# Patient Record
Sex: Female | Born: 1950
Health system: Southern US, Community
[De-identification: ages and names within clinical notes are randomized; demographics above are authoritative.]

## PROBLEM LIST (undated history)

## (undated) DIAGNOSIS — Z951 Presence of aortocoronary bypass graft: Secondary | ICD-10-CM

## (undated) DIAGNOSIS — K219 Gastro-esophageal reflux disease without esophagitis: Secondary | ICD-10-CM

## (undated) DIAGNOSIS — M1990 Unspecified osteoarthritis, unspecified site: Secondary | ICD-10-CM

## (undated) DIAGNOSIS — E669 Obesity, unspecified: Secondary | ICD-10-CM

## (undated) DIAGNOSIS — C50919 Malignant neoplasm of unspecified site of unspecified female breast: Secondary | ICD-10-CM

## (undated) DIAGNOSIS — I519 Heart disease, unspecified: Secondary | ICD-10-CM

## (undated) DIAGNOSIS — E785 Hyperlipidemia, unspecified: Secondary | ICD-10-CM

## (undated) DIAGNOSIS — E079 Disorder of thyroid, unspecified: Secondary | ICD-10-CM

## (undated) DIAGNOSIS — G4730 Sleep apnea, unspecified: Secondary | ICD-10-CM

## (undated) DIAGNOSIS — I10 Essential (primary) hypertension: Secondary | ICD-10-CM

## (undated) DIAGNOSIS — H409 Unspecified glaucoma: Secondary | ICD-10-CM

## (undated) HISTORY — PX: ABDOMINAL HYSTERECTOMY: SHX81

## (undated) HISTORY — DX: Disorder of thyroid, unspecified: E07.9

## (undated) HISTORY — PX: TUBAL LIGATION: SHX77

## (undated) HISTORY — DX: Unspecified glaucoma: H40.9

## (undated) HISTORY — DX: Malignant neoplasm of unspecified site of unspecified female breast: C50.919

## (undated) HISTORY — PX: CHOLECYSTECTOMY: SHX55

## (undated) HISTORY — DX: Sleep apnea, unspecified: G47.30

## (undated) HISTORY — DX: Unspecified osteoarthritis, unspecified site: M19.90

## (undated) HISTORY — DX: Heart disease, unspecified: I51.9

## (undated) HISTORY — PX: TONSILLECTOMY: SUR1361

---

## 1995-08-17 HISTORY — PX: BREAST LUMPECTOMY: SHX2

## 1998-05-01 ENCOUNTER — Other Ambulatory Visit: Admission: RE | Admit: 1998-05-01 | Discharge: 1998-05-01 | Payer: Self-pay | Admitting: Internal Medicine

## 1998-06-02 ENCOUNTER — Ambulatory Visit (HOSPITAL_COMMUNITY): Admission: RE | Admit: 1998-06-02 | Discharge: 1998-06-02 | Payer: Self-pay | Admitting: Internal Medicine

## 1998-12-19 ENCOUNTER — Encounter: Payer: Self-pay | Admitting: Internal Medicine

## 1998-12-19 ENCOUNTER — Ambulatory Visit (HOSPITAL_COMMUNITY): Admission: RE | Admit: 1998-12-19 | Discharge: 1998-12-19 | Payer: Self-pay | Admitting: Internal Medicine

## 1999-01-05 ENCOUNTER — Ambulatory Visit (HOSPITAL_COMMUNITY): Admission: RE | Admit: 1999-01-05 | Discharge: 1999-01-05 | Payer: Self-pay | Admitting: Internal Medicine

## 1999-01-05 ENCOUNTER — Encounter: Payer: Self-pay | Admitting: Internal Medicine

## 2002-02-21 ENCOUNTER — Other Ambulatory Visit: Admission: RE | Admit: 2002-02-21 | Discharge: 2002-02-21 | Payer: Self-pay | Admitting: Obstetrics and Gynecology

## 2002-02-26 ENCOUNTER — Encounter: Payer: Self-pay | Admitting: Obstetrics and Gynecology

## 2002-02-26 ENCOUNTER — Encounter: Admission: RE | Admit: 2002-02-26 | Discharge: 2002-02-26 | Payer: Self-pay | Admitting: Obstetrics and Gynecology

## 2003-03-25 ENCOUNTER — Encounter: Payer: Self-pay | Admitting: Obstetrics and Gynecology

## 2003-03-25 ENCOUNTER — Encounter: Admission: RE | Admit: 2003-03-25 | Discharge: 2003-03-25 | Payer: Self-pay | Admitting: Obstetrics and Gynecology

## 2003-04-10 ENCOUNTER — Other Ambulatory Visit: Admission: RE | Admit: 2003-04-10 | Discharge: 2003-04-10 | Payer: Self-pay | Admitting: Obstetrics and Gynecology

## 2004-03-25 ENCOUNTER — Encounter: Admission: RE | Admit: 2004-03-25 | Discharge: 2004-03-25 | Payer: Self-pay | Admitting: Obstetrics and Gynecology

## 2004-04-21 ENCOUNTER — Other Ambulatory Visit: Admission: RE | Admit: 2004-04-21 | Discharge: 2004-04-21 | Payer: Self-pay | Admitting: Obstetrics and Gynecology

## 2004-05-07 ENCOUNTER — Ambulatory Visit: Payer: Self-pay | Admitting: Cardiology

## 2004-06-18 ENCOUNTER — Ambulatory Visit: Payer: Self-pay | Admitting: Internal Medicine

## 2004-07-30 ENCOUNTER — Ambulatory Visit: Payer: Self-pay | Admitting: *Deleted

## 2004-09-24 ENCOUNTER — Ambulatory Visit: Payer: Self-pay | Admitting: *Deleted

## 2005-04-26 ENCOUNTER — Encounter: Admission: RE | Admit: 2005-04-26 | Discharge: 2005-04-26 | Payer: Self-pay | Admitting: Obstetrics and Gynecology

## 2005-06-23 ENCOUNTER — Encounter: Admission: RE | Admit: 2005-06-23 | Discharge: 2005-06-23 | Payer: Self-pay | Admitting: Surgery

## 2005-07-02 ENCOUNTER — Encounter: Admission: RE | Admit: 2005-07-02 | Discharge: 2005-07-02 | Payer: Self-pay | Admitting: Surgery

## 2006-01-07 ENCOUNTER — Encounter: Admission: RE | Admit: 2006-01-07 | Discharge: 2006-01-07 | Payer: Self-pay | Admitting: Obstetrics and Gynecology

## 2006-06-08 ENCOUNTER — Encounter: Admission: RE | Admit: 2006-06-08 | Discharge: 2006-06-08 | Payer: Self-pay | Admitting: Obstetrics and Gynecology

## 2007-06-12 ENCOUNTER — Encounter: Admission: RE | Admit: 2007-06-12 | Discharge: 2007-06-12 | Payer: Self-pay | Admitting: Obstetrics and Gynecology

## 2007-12-14 ENCOUNTER — Ambulatory Visit: Payer: Self-pay | Admitting: Internal Medicine

## 2007-12-14 DIAGNOSIS — J45909 Unspecified asthma, uncomplicated: Secondary | ICD-10-CM | POA: Insufficient documentation

## 2007-12-14 DIAGNOSIS — C50919 Malignant neoplasm of unspecified site of unspecified female breast: Secondary | ICD-10-CM | POA: Insufficient documentation

## 2007-12-15 ENCOUNTER — Telehealth (INDEPENDENT_AMBULATORY_CARE_PROVIDER_SITE_OTHER): Payer: Self-pay | Admitting: *Deleted

## 2008-01-30 ENCOUNTER — Ambulatory Visit: Payer: Self-pay | Admitting: Internal Medicine

## 2008-01-30 DIAGNOSIS — J069 Acute upper respiratory infection, unspecified: Secondary | ICD-10-CM | POA: Insufficient documentation

## 2008-01-31 ENCOUNTER — Telehealth (INDEPENDENT_AMBULATORY_CARE_PROVIDER_SITE_OTHER): Payer: Self-pay | Admitting: *Deleted

## 2008-02-05 ENCOUNTER — Telehealth (INDEPENDENT_AMBULATORY_CARE_PROVIDER_SITE_OTHER): Payer: Self-pay | Admitting: *Deleted

## 2008-02-06 ENCOUNTER — Telehealth (INDEPENDENT_AMBULATORY_CARE_PROVIDER_SITE_OTHER): Payer: Self-pay | Admitting: *Deleted

## 2008-06-12 ENCOUNTER — Encounter: Admission: RE | Admit: 2008-06-12 | Discharge: 2008-06-12 | Payer: Self-pay | Admitting: Surgery

## 2009-06-13 ENCOUNTER — Encounter: Admission: RE | Admit: 2009-06-13 | Discharge: 2009-06-13 | Payer: Self-pay | Admitting: Obstetrics & Gynecology

## 2010-06-15 ENCOUNTER — Encounter: Admission: RE | Admit: 2010-06-15 | Discharge: 2010-06-15 | Payer: Self-pay | Admitting: Obstetrics & Gynecology

## 2011-01-01 ENCOUNTER — Encounter (INDEPENDENT_AMBULATORY_CARE_PROVIDER_SITE_OTHER): Payer: Self-pay | Admitting: Surgery

## 2011-05-10 ENCOUNTER — Other Ambulatory Visit (INDEPENDENT_AMBULATORY_CARE_PROVIDER_SITE_OTHER): Payer: Self-pay | Admitting: Surgery

## 2011-05-10 DIAGNOSIS — Z1231 Encounter for screening mammogram for malignant neoplasm of breast: Secondary | ICD-10-CM

## 2011-06-17 ENCOUNTER — Ambulatory Visit: Payer: Self-pay

## 2011-06-18 ENCOUNTER — Ambulatory Visit: Payer: Self-pay

## 2011-06-22 ENCOUNTER — Ambulatory Visit
Admission: RE | Admit: 2011-06-22 | Discharge: 2011-06-22 | Disposition: A | Discharge status: Home / Self Care | Payer: PRIVATE HEALTH INSURANCE | Source: Ambulatory Visit | Attending: Surgery | Admitting: Surgery

## 2011-06-22 DIAGNOSIS — Z1231 Encounter for screening mammogram for malignant neoplasm of breast: Secondary | ICD-10-CM

## 2011-06-30 ENCOUNTER — Encounter (INDEPENDENT_AMBULATORY_CARE_PROVIDER_SITE_OTHER): Payer: Self-pay

## 2011-07-01 ENCOUNTER — Ambulatory Visit (INDEPENDENT_AMBULATORY_CARE_PROVIDER_SITE_OTHER): Payer: PRIVATE HEALTH INSURANCE | Admitting: Surgery

## 2011-07-01 ENCOUNTER — Encounter (INDEPENDENT_AMBULATORY_CARE_PROVIDER_SITE_OTHER): Payer: Self-pay | Admitting: Surgery

## 2011-07-01 VITALS — BP 120/82 | HR 72 | Temp 97.0°F | Resp 18 | Ht 62.0 in | Wt 194.0 lb

## 2011-07-01 DIAGNOSIS — C50919 Malignant neoplasm of unspecified site of unspecified female breast: Secondary | ICD-10-CM

## 2011-07-01 NOTE — Progress Notes (Signed)
[facility]  Ovidio Kin, MD,  FACS [address] [place]  [address] [place], [place]    [address] Phone:  [phone] FAX:  [phone]  ASSESSMENT AND PLAN: 1.  Right breast cancer  10 o'clock (UOQ)  Treatment in [place]  Doing well,  Disease free  See me back in one year  2.  OSA - on CPAP - doing well with this  3.  Glaucoma - Dr. Dione Booze - for surgery next week. 4.  Colonoscopy by Dr. Matthias Hughs - Feb 2011.  HISTORY OF PRESENT ILLNESS: Chief Complaint  Patient presents with  . Breast Cancer Long Term Follow Up   Carolyn Fleming is a 60 y.o. (DOB: [birth year])  AA female who is a patient of PLACEY,MARY H, NP, NP and comes to me today for follow up of right breast cancer.  Doing well from breast standpoint.  Her new problem is she has glaucoma of right eye.  Dr. Dione Booze to operate in about one week.  She's anxious about this.   PHYSICAL EXAM: BP 120/82  Pulse 72  Temp(Src) 97 F (36.1 C) (Temporal)  Resp 18  Ht 5\' 2"  (1.575 m)  Wt 194 lb (87.998 kg)  BMI 35.48 kg/m2  HEENT:  Pupils equal.  Dentition good.  No injury. NECK:  Supple.  No thyroid mass. LYMPH NODES:  No cervical, supraclavicular, or axillary adenopathy. BREASTS -  RIGHT:  No palpable mass or nodule.  No nipple discharge.  Scar at 10 o'clock okay.   LEFT:  No palpable mass or nodule.  No nipple discharge. UPPER EXTREMITIES:  No evidence of lymphedema.  DATA REVIEWED: Mammogram - Nov 2012 - okay

## 2011-12-01 ENCOUNTER — Other Ambulatory Visit: Payer: Self-pay | Admitting: *Deleted

## 2011-12-01 DIAGNOSIS — R0789 Other chest pain: Secondary | ICD-10-CM

## 2011-12-02 ENCOUNTER — Ambulatory Visit
Admission: RE | Admit: 2011-12-02 | Discharge: 2011-12-02 | Disposition: A | Discharge status: Home / Self Care | Payer: PRIVATE HEALTH INSURANCE | Source: Ambulatory Visit | Attending: *Deleted | Admitting: *Deleted

## 2011-12-02 DIAGNOSIS — R0789 Other chest pain: Secondary | ICD-10-CM

## 2011-12-06 ENCOUNTER — Other Ambulatory Visit: Payer: PRIVATE HEALTH INSURANCE

## 2012-01-23 ENCOUNTER — Other Ambulatory Visit: Payer: Self-pay

## 2012-01-23 ENCOUNTER — Emergency Department (HOSPITAL_COMMUNITY): Payer: PRIVATE HEALTH INSURANCE

## 2012-01-23 ENCOUNTER — Encounter (HOSPITAL_COMMUNITY): Payer: Self-pay | Admitting: *Deleted

## 2012-01-23 ENCOUNTER — Inpatient Hospital Stay (HOSPITAL_COMMUNITY)
Admission: EM | Admit: 2012-01-23 | Discharge: 2012-01-29 | DRG: 236 | Disposition: A | Discharge status: Home / Self Care | Payer: PRIVATE HEALTH INSURANCE | Source: Ambulatory Visit | Attending: Thoracic Surgery (Cardiothoracic Vascular Surgery) | Admitting: Thoracic Surgery (Cardiothoracic Vascular Surgery)

## 2012-01-23 DIAGNOSIS — Z885 Allergy status to narcotic agent status: Secondary | ICD-10-CM

## 2012-01-23 DIAGNOSIS — G4730 Sleep apnea, unspecified: Secondary | ICD-10-CM | POA: Diagnosis present

## 2012-01-23 DIAGNOSIS — Z853 Personal history of malignant neoplasm of breast: Secondary | ICD-10-CM

## 2012-01-23 DIAGNOSIS — M129 Arthropathy, unspecified: Secondary | ICD-10-CM | POA: Diagnosis present

## 2012-01-23 DIAGNOSIS — Z951 Presence of aortocoronary bypass graft: Secondary | ICD-10-CM

## 2012-01-23 DIAGNOSIS — Z90710 Acquired absence of both cervix and uterus: Secondary | ICD-10-CM

## 2012-01-23 DIAGNOSIS — J45909 Unspecified asthma, uncomplicated: Secondary | ICD-10-CM | POA: Diagnosis present

## 2012-01-23 DIAGNOSIS — Z6835 Body mass index (BMI) 35.0-35.9, adult: Secondary | ICD-10-CM

## 2012-01-23 DIAGNOSIS — I2584 Coronary atherosclerosis due to calcified coronary lesion: Secondary | ICD-10-CM | POA: Diagnosis present

## 2012-01-23 DIAGNOSIS — I2510 Atherosclerotic heart disease of native coronary artery without angina pectoris: Secondary | ICD-10-CM | POA: Diagnosis present

## 2012-01-23 DIAGNOSIS — I214 Non-ST elevation (NSTEMI) myocardial infarction: Principal | ICD-10-CM | POA: Diagnosis present

## 2012-01-23 DIAGNOSIS — E8779 Other fluid overload: Secondary | ICD-10-CM | POA: Diagnosis not present

## 2012-01-23 DIAGNOSIS — E785 Hyperlipidemia, unspecified: Secondary | ICD-10-CM | POA: Diagnosis present

## 2012-01-23 DIAGNOSIS — K219 Gastro-esophageal reflux disease without esophagitis: Secondary | ICD-10-CM | POA: Diagnosis present

## 2012-01-23 DIAGNOSIS — I10 Essential (primary) hypertension: Secondary | ICD-10-CM | POA: Diagnosis present

## 2012-01-23 DIAGNOSIS — Z7982 Long term (current) use of aspirin: Secondary | ICD-10-CM

## 2012-01-23 DIAGNOSIS — E669 Obesity, unspecified: Secondary | ICD-10-CM | POA: Diagnosis present

## 2012-01-23 DIAGNOSIS — Z882 Allergy status to sulfonamides status: Secondary | ICD-10-CM

## 2012-01-23 DIAGNOSIS — Z9089 Acquired absence of other organs: Secondary | ICD-10-CM

## 2012-01-23 DIAGNOSIS — R079 Chest pain, unspecified: Secondary | ICD-10-CM

## 2012-01-23 DIAGNOSIS — Z91040 Latex allergy status: Secondary | ICD-10-CM

## 2012-01-23 HISTORY — DX: Essential (primary) hypertension: I10

## 2012-01-23 HISTORY — DX: Gastro-esophageal reflux disease without esophagitis: K21.9

## 2012-01-23 HISTORY — DX: Obesity, unspecified: E66.9

## 2012-01-23 HISTORY — DX: Presence of aortocoronary bypass graft: Z95.1

## 2012-01-23 HISTORY — DX: Hyperlipidemia, unspecified: E78.5

## 2012-01-23 LAB — CARDIAC PANEL(CRET KIN+CKTOT+MB+TROPI)
Relative Index: 13.5 — ABNORMAL HIGH (ref 0.0–2.5)
Total CK: 1123 U/L — ABNORMAL HIGH (ref 7–177)
Troponin I: >25 ng/mL (ref <0.30)

## 2012-01-23 LAB — DIFFERENTIAL
Basophils Absolute: 0 10*3/uL (ref 0.0–0.1)
Basophils Relative: 0 % (ref 0–1)
Eosinophils Relative: 0 % (ref 0–5)
Monocytes Absolute: 0.7 10*3/uL (ref 0.1–1.0)
Monocytes Relative: 5 % (ref 3–12)

## 2012-01-23 LAB — CBC
HCT: 44.5 % (ref 36.0–46.0)
MCH: 28.8 pg (ref 26.0–34.0)
MCHC: 34.6 g/dL (ref 30.0–36.0)
MCV: 83.3 fL (ref 78.0–100.0)
RDW: 13.7 % (ref 11.5–15.5)

## 2012-01-23 LAB — BASIC METABOLIC PANEL
BUN: 17 mg/dL (ref 6–23)
Calcium: 10.5 mg/dL (ref 8.4–10.5)
Creatinine, Ser: 1 mg/dL (ref 0.50–1.10)
GFR calc Af Amer: 70 mL/min — ABNORMAL LOW (ref >90)

## 2012-01-23 LAB — PROTIME-INR: Prothrombin Time: 13 seconds (ref 11.6–15.2)

## 2012-01-23 LAB — POCT I-STAT TROPONIN I: Troponin i, poc: 3.28 ng/mL (ref 0.00–0.08)

## 2012-01-23 LAB — MAGNESIUM: Magnesium: 1.7 mg/dL (ref 1.5–2.5)

## 2012-01-23 MED ORDER — HEPARIN (PORCINE) IN NACL 100-0.45 UNIT/ML-% IJ SOLN
1000.0000 [IU]/h | INTRAMUSCULAR | Status: DC
  Administered 2012-01-23: 1000 [IU]/h via INTRAVENOUS
  Filled 2012-01-23 (×2): qty 250

## 2012-01-23 MED ORDER — METOPROLOL TARTRATE 1 MG/ML IV SOLN: 5.0000 mg | Freq: Once | INTRAVENOUS | Status: DC

## 2012-01-23 MED ORDER — ASPIRIN EC 81 MG PO TBEC
81.0000 mg | DELAYED_RELEASE_TABLET | Freq: Every day | ORAL | Status: DC
  Administered 2012-01-24: 81 mg via ORAL
  Filled 2012-01-23: qty 1

## 2012-01-23 MED ORDER — ASPIRIN 81 MG PO CHEW: 324.0000 mg | CHEWABLE_TABLET | ORAL | Status: DC

## 2012-01-23 MED ORDER — DIPHENHYDRAMINE HCL 25 MG PO CAPS
25.0000 mg | ORAL_CAPSULE | Freq: Once | ORAL | Status: AC
  Administered 2012-01-24: 25 mg via ORAL
  Filled 2012-01-23: qty 1

## 2012-01-23 MED ORDER — NITROGLYCERIN 0.4 MG SL SUBL: 0.4000 mg | SUBLINGUAL_TABLET | SUBLINGUAL | Status: DC | PRN

## 2012-01-23 MED ORDER — LABETALOL HCL 5 MG/ML IV SOLN
20.0000 mg | Freq: Once | INTRAVENOUS | Status: AC
  Administered 2012-01-23: 20 mg via INTRAVENOUS
  Filled 2012-01-23: qty 4

## 2012-01-23 MED ORDER — ASPIRIN 300 MG RE SUPP
300.0000 mg | RECTAL | Status: DC
  Filled 2012-01-23: qty 1

## 2012-01-23 MED ORDER — NITROGLYCERIN IN D5W 200-5 MCG/ML-% IV SOLN
2.0000 ug/min | INTRAVENOUS | Status: DC
  Administered 2012-01-23: 10 ug/min via INTRAVENOUS
  Administered 2012-01-24: 15 ug/min via INTRAVENOUS
  Administered 2012-01-24: 20 ug/min via INTRAVENOUS
  Filled 2012-01-23 (×2): qty 250

## 2012-01-23 MED ORDER — ATORVASTATIN CALCIUM 20 MG PO TABS
20.0000 mg | ORAL_TABLET | Freq: Every day | ORAL | Status: DC
  Administered 2012-01-25 – 2012-01-28 (×4): 20 mg via ORAL
  Filled 2012-01-23 (×6): qty 1

## 2012-01-23 MED ORDER — METOPROLOL TARTRATE 25 MG PO TABS
25.0000 mg | ORAL_TABLET | Freq: Two times a day (BID) | ORAL | Status: DC
  Administered 2012-01-24 (×2): 25 mg via ORAL
  Filled 2012-01-23 (×3): qty 1

## 2012-01-23 MED ORDER — ACETAMINOPHEN 325 MG PO TABS
650.0000 mg | ORAL_TABLET | ORAL | Status: DC | PRN
  Administered 2012-01-24: 650 mg via ORAL
  Filled 2012-01-23: qty 2

## 2012-01-23 MED ORDER — HEPARIN BOLUS VIA INFUSION
4000.0000 [IU] | Freq: Once | INTRAVENOUS | Status: AC
  Administered 2012-01-23: 4000 [IU] via INTRAVENOUS

## 2012-01-23 MED ORDER — ONDANSETRON HCL 4 MG/2ML IJ SOLN: 4.0000 mg | Freq: Four times a day (QID) | INTRAMUSCULAR | Status: DC | PRN

## 2012-01-23 MED ORDER — ASPIRIN 81 MG PO CHEW
324.0000 mg | CHEWABLE_TABLET | Freq: Once | ORAL | Status: AC
  Administered 2012-01-23: 324 mg via ORAL
  Filled 2012-01-23: qty 4

## 2012-01-23 NOTE — ED Notes (Signed)
Pt from home with reports of intermittent central chest pain that comes and goes for about a month. Pt reports vomiting 3 times prior to arrival but denied nausea. Pt endorses hx of same for about a month and has seen PCP with normal EKG done in May. Pt reports being treated for acid reflux with no improvement in symptoms. Pt reports almost passing out during episode and endorses that pain is worse with lying down.

## 2012-01-23 NOTE — Progress Notes (Signed)
Received patient from [facility] via [facility] . Patient awake, alert  and oriented x 4 . Patient denies pain or discomfort. Heparin gtt infusing at 1000 unit/hr, NS @ 125 mls /hr, 02 @2  LNP. Patient oriented to room, night table, telephone in close reach. Pt settled comfortable in bed. Pt's only concern is to have something for sleep. We will continue to monitor.

## 2012-01-23 NOTE — Progress Notes (Signed)
Lab results CK,MB 151.9, Troponin > 25 Dr Maryelizabeth Kaufmann on unit and was made aware. Order received to start Ntg gtt stat.

## 2012-01-23 NOTE — Progress Notes (Signed)
Pt. Refused cpap. Pt.states she will nofify if she feels like she needs cpap. Pt. States she uses nasal pillows at home and does not like the mask type that are offered here at the hospital. Pt. Currently stable on 2L of oxygen sats at 97.

## 2012-01-23 NOTE — ED Notes (Signed)
Gave troponin results to MD Brooke Dare

## 2012-01-23 NOTE — Progress Notes (Signed)
ANTICOAGULATION CONSULT NOTE - Initial Consult  Pharmacy Consult for heparin Indication: NSTEMI  Allergies  Allergen Reactions  . Aspirin     REACTION: gi upset  . Codeine   . Latex     REACTION: hives  . Sulfonamide Derivatives     REACTION: gi upset    Patient Measurements: Height: 5\' 2"  (157.5 cm) Weight: 191 lb 5.8 oz (86.8 kg) IBW/kg (Calculated) : 50.1   Vital Signs: Temp: 98.6 F (37 C) (06/09 1625) Temp src: Oral (06/09 1625) BP: 172/78 mmHg (06/09 2118) Pulse Rate: 81  (06/09 2118)  Labs:  Basename 01/23/12 1850  HGB 15.4*  HCT 44.5  PLT 292  APTT --  LABPROT 13.0  INR 0.96  HEPARINUNFRC --  CREATININE 1.00  CKTOTAL --  CKMB --  TROPONINI --    Estimated Creatinine Clearance: 61.2 ml/min (by C-G formula based on Cr of 1).   Medical History: Past Medical History  Diagnosis Date  . Asthma   . Arthritis   . Sleep apnea     uses cpap  . Breast cancer   . GERD (gastroesophageal reflux disease)     Medications:  Prescriptions prior to admission  Medication Sig Dispense Refill  . Albuterol Sulfate (PROAIR HFA IN) Inhale into the lungs. Emergency inhaler for Asthma      . Betaxolol HCl (BETOPTIC-S OP) Apply 1 drop to eye 2 (two) times daily.       . Bimatoprost (LUMIGAN OP) Apply 1 drop to eye every evening.        . Brimonidine Tartrate-Timolol (COMBIGAN OP) Apply 1 drop to eye 2 (two) times daily. 1 drop both eyes am & pm      . dorzolamide (TRUSOPT) 2 % ophthalmic solution Place 1 drop into both eyes 3 (three) times daily.      Marland Kitchen levalbuterol (XOPENEX) 0.63 MG/3ML nebulizer solution Take 1 ampule by nebulization every 6 (six) hours as needed. Wheezing.      Marland Kitchen loratadine (CLARITIN) 10 MG tablet Take 10 mg by mouth daily.      . mometasone (NASONEX) 50 MCG/ACT nasal spray Place 2 sprays into the nose 2 (two) times daily.      Marland Kitchen omeprazole (PRILOSEC) 20 MG capsule Take 20 mg by mouth daily.      . sodium chloride (OCEAN) 0.65 % nasal spray Place  1 spray into the nose as needed. Congestion.       Scheduled:    . aspirin  324 mg Oral Once  . aspirin  324 mg Oral NOW   Or  . aspirin  300 mg Rectal NOW  . aspirin EC  81 mg Oral Daily  . atorvastatin  20 mg Oral q1800  . heparin  4,000 Units Intravenous Once  . labetalol  20 mg Intravenous Once  . metoprolol tartrate  25 mg Oral BID  . DISCONTD: metoprolol  5 mg Intravenous Once    Assessment: 61yo female transferred from [facility] for NSTEMI to continue heparin until cardiac cath.  Goal of Therapy:  Heparin level 0.3-0.7 units/ml Monitor platelets by anticoagulation protocol: Yes   Plan:  Heparin bolus of 4000 units followed by gtt at 1000 units/hr was started at [facility]; will continue at current rate and monitor heparin levels and CBC.  Colleen Can PharmD BCPS 01/23/2012,10:07 PM

## 2012-01-23 NOTE — ED Notes (Signed)
Pt states that she is a hard stick -  Would like to see if the IV team can get the lab samples when they come to place IV.

## 2012-01-23 NOTE — ED Provider Notes (Signed)
History     CSN: [id number]  Arrival date & time 01/23/12  1509   First MD Initiated Contact with Patient 01/23/12 1704      Chief Complaint  Patient presents with  . Chest Pain  . Emesis    (Consider location/radiation/quality/duration/timing/severity/associated sxs/prior treatment) HPI Comments: She has been treated with PPI and seen a gastroenterologist for this presentation however she is noticed it has been worse with exertion. Near syncopal  Patient is a 61 y.o. female presenting with chest pain. The history is provided by the patient. No language interpreter was used.  Chest Pain Episode onset: intermittent for the past few weeks. Chest pain occurs constantly. The chest pain is worsening. The pain is associated with exertion. The severity of the pain is severe. The quality of the pain is described as sharp. The pain radiates to the mid back and upper back. Chest pain is worsened by exertion. Primary symptoms include shortness of breath. Pertinent negatives for primary symptoms include no fever, no fatigue, no syncope, no cough, no wheezing, no palpitations, no abdominal pain, no nausea, no vomiting and no dizziness.  The shortness of breath began more than 2 days ago. The shortness of breath developed with exertion. The shortness of breath is mild.  Pertinent negatives for associated symptoms include no diaphoresis, no lower extremity edema, no near-syncope, no numbness and no weakness. She tried proton pump inhibitors for the symptoms.     Past Medical History  Diagnosis Date  . Asthma   . Arthritis   . Sleep apnea     uses cpap  . Breast cancer   . GERD (gastroesophageal reflux disease)     Past Surgical History  Procedure Date  . Breast lumpectomy 1997  . Tubal ligation   . Abdominal hysterectomy     Family History  Problem Relation Age of Onset  . Cancer Mother     Colon  . Cancer Brother     Lung    History  Substance Use Topics  . Smoking status: Never  Smoker   . Smokeless tobacco: Never Used  . Alcohol Use: No    OB History    Grav Para Term Preterm Abortions TAB SAB Ect Mult Living                  Review of Systems  Constitutional: Negative for fever, diaphoresis, activity change, appetite change and fatigue.  HENT: Negative for congestion, sore throat, rhinorrhea, neck pain and neck stiffness.   Respiratory: Positive for shortness of breath. Negative for cough and wheezing.   Cardiovascular: Positive for chest pain. Negative for palpitations, syncope and near-syncope.  Gastrointestinal: Negative for nausea, vomiting and abdominal pain.  Genitourinary: Negative for dysuria, urgency, frequency and flank pain.  Musculoskeletal: Negative for myalgias, back pain and arthralgias.  Neurological: Negative for dizziness, weakness, light-headedness, numbness and headaches.  All other systems reviewed and are negative.    Allergies  Aspirin; Codeine; Latex; and Sulfonamide derivatives  Home Medications   Current Outpatient Rx  Name Route Sig Dispense Refill  . PROAIR HFA IN Inhalation Inhale into the lungs. Emergency inhaler for Asthma    . BETOPTIC-S OP Ophthalmic Apply 1 drop to eye 2 (two) times daily.     Marland Kitchen LUMIGAN OP Ophthalmic Apply 1 drop to eye every evening.      Elmer Sow OP Ophthalmic Apply 1 drop to eye 2 (two) times daily. 1 drop both eyes am & pm    . DORZOLAMIDE HCL  2 % OP SOLN Both Eyes Place 1 drop into both eyes 3 (three) times daily.    Marland Kitchen LEVALBUTEROL HCL 0.63 MG/3ML IN NEBU Nebulization Take 1 ampule by nebulization every 6 (six) hours as needed. Wheezing.    Marland Kitchen LORATADINE 10 MG PO TABS Oral Take 10 mg by mouth daily.    . MOMETASONE FUROATE 50 MCG/ACT NA SUSP Nasal Place 2 sprays into the nose 2 (two) times daily.    Marland Kitchen OMEPRAZOLE 20 MG PO CPDR Oral Take 20 mg by mouth daily.    Marland Kitchen SALINE NASAL SPRAY 0.65 % NA SOLN Nasal Place 1 spray into the nose as needed. Congestion.      BP 201/100  Pulse 103  Temp(Src)  98.6 F (37 C) (Oral)  Resp 26  SpO2 100%  Physical Exam  Nursing note and vitals reviewed. Constitutional: She is oriented to person, place, and time. She appears well-developed and well-nourished. No distress.  HENT:  Head: Normocephalic and atraumatic.  Mouth/Throat: Oropharynx is clear and moist. No oropharyngeal exudate.  Eyes: Conjunctivae and EOM are normal. Pupils are equal, round, and reactive to light.  Neck: Normal range of motion. Neck supple.  Cardiovascular: Regular rhythm, normal heart sounds and intact distal pulses.  Exam reveals no gallop and no friction rub.   No murmur heard.      Tachycardic rate  Pulmonary/Chest: Effort normal and breath sounds normal. No respiratory distress. She has no wheezes. She has no rales. She exhibits no tenderness.  Abdominal: Soft. Bowel sounds are normal. There is no tenderness. There is no rebound and no guarding.  Musculoskeletal: Normal range of motion. She exhibits no edema and no tenderness.  Neurological: She is alert and oriented to person, place, and time. No cranial nerve deficit.  Skin: Skin is warm. No rash noted.    ED Course  Procedures (including critical care time)  CRITICAL CARE Performed by: Dayton Bailiff   Total critical care time: 45 min  Critical care time was exclusive of separately billable procedures and treating other patients.  Critical care was necessary to treat or prevent imminent or life-threatening deterioration.  Critical care was time spent personally by me on the following activities: development of treatment plan with patient and/or surrogate as well as nursing, discussions with consultants, evaluation of patient's response to treatment, examination of patient, obtaining history from patient or surrogate, ordering and performing treatments and interventions, ordering and review of laboratory studies, ordering and review of radiographic studies, pulse oximetry and re-evaluation of patient's  condition.   Date: 01/23/2012  Rate: 106  Rhythm: sinus tachycardia  QRS Axis: normal  Intervals: normal  ST/T Wave abnormalities: ST depressions laterally and elevation in aVR  Conduction Disutrbances:none  Narrative Interpretation:   Old EKG Reviewed: none available  Labs Reviewed  CBC - Abnormal; Notable for the following:    WBC 15.4 (*)    RBC 5.34 (*)    Hemoglobin 15.4 (*)    All other components within normal limits  DIFFERENTIAL - Abnormal; Notable for the following:    Neutrophils Relative 87 (*)    Neutro Abs 13.4 (*)    Lymphocytes Relative 8 (*)    All other components within normal limits  BASIC METABOLIC PANEL - Abnormal; Notable for the following:    Glucose, Bld 126 (*)    GFR calc non Af Amer 60 (*)    GFR calc Af Amer 70 (*)    All other components within normal limits  POCT I-STAT  TROPONIN I - Abnormal; Notable for the following:    Troponin i, poc 3.28 (*)    All other components within normal limits  PROTIME-INR  D-DIMER, QUANTITATIVE  PRO B NATRIURETIC PEPTIDE   Dg Chest 2 View  01/23/2012  *RADIOLOGY REPORT*  Clinical Data: Chest pain.  CHEST - 2 VIEW  Comparison: None.  Findings: There are low lung volumes with resulting bibasilar atelectasis.  No confluent airspace opacity, pleural effusion or pneumothorax is seen.  Heart size and mediastinal contours are unremarkable for the degree of inspiration.  There are probable cholecystectomy clips.  Telemetry leads overlie the chest.  IMPRESSION: Low lung volumes.  No acute cardiopulmonary process evident.  Original Report Authenticated By: Gerrianne Scale, M.D.     1. NSTEMI (non-ST elevated myocardial infarction)       MDM  Acute coronary syndrome with a concerning story for unstable angina. She was placed on heparin. Labetalol for blood pressure control. She received aspirin. Did not require nitroglycerin she is pain-free. She has new ST depressions laterally as well as elevation it is isolated in  aVR. Troponin 3.28. D-dimer is negative. Discussed with the cardiology fellow who accepted patient in transfer to [facility] cone to a step down unit. She was started on a heparin drip.  Not placed on plavix        Dayton Bailiff, MD 01/23/12 1931

## 2012-01-23 NOTE — H&P (Signed)
Primary Cardiologist: none previously assigned; patient is requesting Dr. Verdis Prime [facility])  Patient Location: transferred from [facility] ED to [facility] room 2916  Chief Complaint: chest pain  HPI:  Mrs. Kinser is a delightful 61 year old woman with presumed hypertension, asthma, sleep apnea, and no prior known coronary disease who presents with chest pain.  For the past few months she has been experiencing central chest pressure and tightness with occasional radiation to her back. She has sought care as an outpatient and her symptoms have been previously ascribed to her underlying asthma and her underlying acid reflux. Over the past couple of weeks, her symptoms have become more bothersome, occuring rather predictably with exertion. She specifically mentions walking with her neighbor yesterday and having to stop after a short distance on account of chest pain. Her chest pain was pronounced again today 02-06-2023) and for the first time was accompanied by nausea and 3 episodes of vomiting around 1pm. Her symptoms today were also accompanied by diaphoresis, clammy-ness, and a sensation of near-syncope. She had intended to drive to [place] today for the funeral of her brother who recently passed away, but given her symptoms, she stayed here in [place]. Rating her pain at home a 10/10, she sought help from her neighbor who then drove her to the [facility] ED.   At [facility], she had an ECG and cardiac bio-markers, both of which were suggestive, prompting transfer to [facility] for further evaluation and management. Upon arrival to [facility], she rates her chest discomfort at 3/10, saying it is still present but much improved from before. She is no longer feeling nauseous.  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  Omeprazole 20mg  NaCl (OCEAN) 0.65% nasal spray PRN  Exam: Afebrile 179/96 74 16 97% 2L [place] GEN no acute distress NECK JVP 5cm H2O, no carotid bruits CHEST clear to auscultation bilaterally CARDIAC S1 S2 regular with no murmurs/rubs ABDOMEN soft nontender nondistended, no bruits EXT warm well-perfused, no edema, 2+ DP pulses bilaterally SKIN warm & dry NEURO A&Ox3 with no gross deficits detected  Labs: WBC 15K Hgb 15.4 Plts 292K K 4.4 Cr 1.0 Glucose 126 INR 0.9 D-dimer 0.29 Pro-BNP 1500 Troponin (POC) 3.28  ECG:  Gerri Spore Long: sinus tachycardia (106) with single lead (aVR) ST elevation with 1-30mm ST depression in I, aVL; >51mm ST depression in lead II; no Q waves Piney: normal sinus, inferior Q waves (II, aVF), TWI V1-V5  CXR: no evident infiltrate, effusion, or congestion  Assessment/Plan 61 year old woman with hypertension and hyperlipdemia presenting with signs, symptoms, and labs consistent with a non-ST elevation myocardial infarction. Her stuttering symptoms for the past few months followed by accelerated progression over the past 48 hours together with  her ECG/labs are consistent with ischemia. We will work to aggressively get her  pain free tonight with a plan for her to go to the cath lab in the morning for invasive angiography and possible percutaneous coronary revascularization.   1) NSTEMI - Anti-platelet therapy in the form of Asprin 324mg  once followed by 81mg  daily. Though labeled as having an "Aspirin allergy," the specific reaction was "GI upset" and not anaphyaxis or hives. Therefore the benefits of aspirin-induced platelet inhibition far outweigh the downsides.  - IV Heparin infusion with continued cycling of cardiac biomarkers. We will need to ensure adequate BP control and avoidance of severe HTN while pursuing anticoagulation - IV Nitroglycerine infusion for pain control as tolerated by blood pressure - NPO for invasive angiography and possible PCI in the AM - Check TTE to assess LV function and to assess for any structural heart disease - Check lipid profile and HA1C - Holding on statin therapy as she reports intolerance to multiple statins in the past  2) HTN - The patient denies a prior diagnosis of HTN and says her BP is usually normal - For now Metoprolol 25mg  PO BID and a Nitro gtt with adjustments as necessary - Once her coronary anatomy and LV function are defined, can then consider other adjunctive agents (specifically ACE-I).   3) Leukocytosis likely due to myocardial ischemia - No fevers or overt clinical signs of infection, so will hold on initiation of antibiotics  Zacarias Pontes, MD Cardiology Fellow On-Call [phone]

## 2012-01-24 ENCOUNTER — Inpatient Hospital Stay (HOSPITAL_COMMUNITY): Payer: PRIVATE HEALTH INSURANCE

## 2012-01-24 ENCOUNTER — Encounter (HOSPITAL_COMMUNITY)
Admission: EM | Disposition: A | Discharge status: Home / Self Care | Payer: Self-pay | Source: Ambulatory Visit | Attending: Thoracic Surgery (Cardiothoracic Vascular Surgery)

## 2012-01-24 ENCOUNTER — Inpatient Hospital Stay (HOSPITAL_COMMUNITY): Payer: PRIVATE HEALTH INSURANCE | Admitting: Anesthesiology

## 2012-01-24 ENCOUNTER — Encounter (HOSPITAL_COMMUNITY): Payer: Self-pay | Admitting: Thoracic Surgery (Cardiothoracic Vascular Surgery)

## 2012-01-24 ENCOUNTER — Encounter (HOSPITAL_COMMUNITY): Payer: Self-pay | Admitting: Anesthesiology

## 2012-01-24 DIAGNOSIS — Z951 Presence of aortocoronary bypass graft: Secondary | ICD-10-CM

## 2012-01-24 DIAGNOSIS — I2510 Atherosclerotic heart disease of native coronary artery without angina pectoris: Secondary | ICD-10-CM

## 2012-01-24 HISTORY — PX: LEFT HEART CATHETERIZATION WITH CORONARY ANGIOGRAM: SHX5451

## 2012-01-24 HISTORY — DX: Presence of aortocoronary bypass graft: Z95.1

## 2012-01-24 HISTORY — PX: CORONARY ARTERY BYPASS GRAFT: SHX141

## 2012-01-24 LAB — BASIC METABOLIC PANEL
CO2: 21 mEq/L (ref 19–32)
Calcium: 9.3 mg/dL (ref 8.4–10.5)
Creatinine, Ser: 1.01 mg/dL (ref 0.50–1.10)
GFR calc non Af Amer: 59 mL/min — ABNORMAL LOW (ref >90)
Glucose, Bld: 123 mg/dL — ABNORMAL HIGH (ref 70–99)
Sodium: 142 mEq/L (ref 135–145)

## 2012-01-24 LAB — POCT I-STAT 4, (NA,K, GLUC, HGB,HCT)
Glucose, Bld: 105 mg/dL — ABNORMAL HIGH (ref 70–99)
Glucose, Bld: 142 mg/dL — ABNORMAL HIGH (ref 70–99)
Glucose, Bld: 124 mg/dL — ABNORMAL HIGH (ref 70–99)
HCT: 33 % — ABNORMAL LOW (ref 36.0–46.0)
HCT: 24 % — ABNORMAL LOW (ref 36.0–46.0)
HCT: 35 % — ABNORMAL LOW (ref 36.0–46.0)
Hemoglobin: 11.2 g/dL — ABNORMAL LOW (ref 12.0–15.0)
Hemoglobin: 7.8 g/dL — ABNORMAL LOW (ref 12.0–15.0)
Hemoglobin: 11.9 g/dL — ABNORMAL LOW (ref 12.0–15.0)
Potassium: 3.8 mEq/L (ref 3.5–5.1)
Potassium: 4.8 mEq/L (ref 3.5–5.1)
Sodium: 139 mEq/L (ref 135–145)
Sodium: 136 mEq/L (ref 135–145)
Sodium: 138 mEq/L (ref 135–145)

## 2012-01-24 LAB — CBC
HCT: 38.7 % (ref 36.0–46.0)
Hemoglobin: 14.3 g/dL (ref 12.0–15.0)
Hemoglobin: 12.9 g/dL (ref 12.0–15.0)
MCH: 28.5 pg (ref 26.0–34.0)
MCH: 27.4 pg (ref 26.0–34.0)
MCHC: 34.3 g/dL (ref 30.0–36.0)
MCHC: 33.3 g/dL (ref 30.0–36.0)
MCHC: 32.9 g/dL (ref 30.0–36.0)
MCV: 83.2 fL (ref 78.0–100.0)
MCV: 83.9 fL (ref 78.0–100.0)
MCV: 83.2 fL (ref 78.0–100.0)
Platelets: 177 10*3/uL (ref 150–400)
RBC: 5.01 MIL/uL (ref 3.87–5.11)
RDW: 14 % (ref 11.5–15.5)

## 2012-01-24 LAB — CARDIAC PANEL(CRET KIN+CKTOT+MB+TROPI)
CK, MB: 101.8 ng/mL (ref 0.3–4.0)
Relative Index: 13.5 — ABNORMAL HIGH (ref 0.0–2.5)
Relative Index: 11.6 — ABNORMAL HIGH (ref 0.0–2.5)
Total CK: 609 U/L — ABNORMAL HIGH (ref 7–177)
Troponin I: >25 ng/mL (ref <0.30)
Troponin I: >20 ng/mL (ref <0.30)

## 2012-01-24 LAB — PROTIME-INR
INR: 1.1 (ref 0.00–1.49)
Prothrombin Time: 14.4 seconds (ref 11.6–15.2)
Prothrombin Time: 17.7 seconds — ABNORMAL HIGH (ref 11.6–15.2)

## 2012-01-24 LAB — LIPID PANEL
Cholesterol: 226 mg/dL — ABNORMAL HIGH (ref 0–200)
LDL Cholesterol: 188 mg/dL — ABNORMAL HIGH (ref 0–99)
Total CHOL/HDL Ratio: 5.5 RATIO
VLDL: 27 mg/dL (ref 0–40)

## 2012-01-24 LAB — POCT I-STAT 3, ART BLOOD GAS (G3+)
Acid-base deficit: 1 mmol/L (ref 0.0–2.0)
Bicarbonate: 24.2 mEq/L — ABNORMAL HIGH (ref 20.0–24.0)
O2 Saturation: 97 %
O2 Saturation: 100 %
TCO2: 25 mmol/L (ref 0–100)
pCO2 arterial: 42.4 mmHg (ref 35.0–45.0)
pCO2 arterial: 38.2 mmHg (ref 35.0–45.0)
pH, Arterial: 7.41 — ABNORMAL HIGH (ref 7.350–7.400)
pO2, Arterial: 351 mmHg — ABNORMAL HIGH (ref 80.0–100.0)

## 2012-01-24 LAB — COMPREHENSIVE METABOLIC PANEL
Albumin: 3.1 g/dL — ABNORMAL LOW (ref 3.5–5.2)
BUN: 18 mg/dL (ref 6–23)
Creatinine, Ser: 1.16 mg/dL — ABNORMAL HIGH (ref 0.50–1.10)
GFR calc Af Amer: 58 mL/min — ABNORMAL LOW (ref >90)
Glucose, Bld: 114 mg/dL — ABNORMAL HIGH (ref 70–99)
Total Bilirubin: 0.3 mg/dL (ref 0.3–1.2)
Total Protein: 6.2 g/dL (ref 6.0–8.3)

## 2012-01-24 LAB — HEMOGLOBIN A1C: Hgb A1c MFr Bld: 6.3 % — ABNORMAL HIGH (ref <5.7)

## 2012-01-24 LAB — PLATELET COUNT: Platelets: 146 10*3/uL — ABNORMAL LOW (ref 150–400)

## 2012-01-24 LAB — POCT ACTIVATED CLOTTING TIME: Activated Clotting Time: 138 seconds

## 2012-01-24 LAB — APTT: aPTT: 54 seconds — ABNORMAL HIGH (ref 24–37)

## 2012-01-24 LAB — PREPARE RBC (CROSSMATCH)

## 2012-01-24 LAB — HEPARIN LEVEL (UNFRACTIONATED): Heparin Unfractionated: 0.65 IU/mL (ref 0.30–0.70)

## 2012-01-24 LAB — HEMOGLOBIN AND HEMATOCRIT, BLOOD: Hemoglobin: 7.9 g/dL — ABNORMAL LOW (ref 12.0–15.0)

## 2012-01-24 SURGERY — CORONARY ARTERY BYPASS GRAFTING (CABG)
Anesthesia: General | Site: Chest | Wound class: Clean

## 2012-01-24 SURGERY — LEFT HEART CATHETERIZATION WITH CORONARY ANGIOGRAM
Anesthesia: LOCAL

## 2012-01-24 MED ORDER — METOPROLOL TARTRATE 12.5 MG HALF TABLET
12.5000 mg | ORAL_TABLET | Freq: Two times a day (BID) | ORAL | Status: DC
  Filled 2012-01-24 (×3): qty 1

## 2012-01-24 MED ORDER — DEXTROSE 5 % IV SOLN
1.5000 g | INTRAVENOUS | Status: DC
  Administered 2012-01-24: 1.5 g via INTRAVENOUS
  Administered 2012-01-24: .75 g via INTRAVENOUS
  Filled 2012-01-24: qty 1.5

## 2012-01-24 MED ORDER — MAGNESIUM SULFATE 40 MG/ML IJ SOLN
4.0000 g | Freq: Once | INTRAMUSCULAR | Status: AC
  Administered 2012-01-24: 4 g via INTRAVENOUS
  Filled 2012-01-24: qty 100

## 2012-01-24 MED ORDER — SODIUM CHLORIDE 0.9 % IV SOLN
INTRAVENOUS | Status: DC
  Filled 2012-01-24: qty 1

## 2012-01-24 MED ORDER — VECURONIUM BROMIDE 10 MG IV SOLR
INTRAVENOUS | Status: DC | PRN
  Administered 2012-01-24: 8 mg via INTRAVENOUS
  Administered 2012-01-24: 10 mg via INTRAVENOUS
  Administered 2012-01-24: 2 mg via INTRAVENOUS

## 2012-01-24 MED ORDER — LACTATED RINGERS IV SOLN: 500.0000 mL | Freq: Once | INTRAVENOUS | Status: AC | PRN

## 2012-01-24 MED ORDER — DIAZEPAM 5 MG PO TABS
5.0000 mg | ORAL_TABLET | ORAL | Status: AC
  Administered 2012-01-24: 5 mg via ORAL
  Filled 2012-01-24: qty 1

## 2012-01-24 MED ORDER — BETAXOLOL HCL 0.25 % OP SUSP
1.0000 [drp] | Freq: Two times a day (BID) | OPHTHALMIC | Status: DC
  Administered 2012-01-25 – 2012-01-29 (×8): 1 [drp] via OPHTHALMIC
  Filled 2012-01-24 (×2): qty 10

## 2012-01-24 MED ORDER — HEPARIN SODIUM (PORCINE) 1000 UNIT/ML IJ SOLN
INTRAMUSCULAR | Status: DC | PRN
  Administered 2012-01-24: 37000 [IU] via INTRAVENOUS
  Administered 2012-01-24: 3000 [IU] via INTRAVENOUS

## 2012-01-24 MED ORDER — PANTOPRAZOLE SODIUM 40 MG PO TBEC
40.0000 mg | DELAYED_RELEASE_TABLET | Freq: Every day | ORAL | Status: DC
  Administered 2012-01-24: 40 mg via ORAL
  Filled 2012-01-24: qty 1

## 2012-01-24 MED ORDER — PANTOPRAZOLE SODIUM 40 MG PO TBEC
40.0000 mg | DELAYED_RELEASE_TABLET | Freq: Every day | ORAL | Status: DC
  Administered 2012-01-26 – 2012-01-29 (×4): 40 mg via ORAL
  Filled 2012-01-24 (×4): qty 1

## 2012-01-24 MED ORDER — MORPHINE SULFATE 2 MG/ML IJ SOLN: 1.0000 mg | INTRAMUSCULAR | Status: DC | PRN

## 2012-01-24 MED ORDER — BISACODYL 10 MG RE SUPP: 10.0000 mg | Freq: Every day | RECTAL | Status: DC

## 2012-01-24 MED ORDER — ONDANSETRON HCL 4 MG/2ML IJ SOLN: 4.0000 mg | Freq: Four times a day (QID) | INTRAMUSCULAR | Status: DC | PRN

## 2012-01-24 MED ORDER — SODIUM CHLORIDE 0.9 % IV SOLN: INTRAVENOUS | Status: DC

## 2012-01-24 MED ORDER — ALBUMIN HUMAN 5 % IV SOLN
250.0000 mL | INTRAVENOUS | Status: DC | PRN
  Administered 2012-01-24 (×3): 250 mL via INTRAVENOUS
  Filled 2012-01-24: qty 250

## 2012-01-24 MED ORDER — PANTOPRAZOLE SODIUM 40 MG PO TBEC: 40.0000 mg | DELAYED_RELEASE_TABLET | Freq: Every day | ORAL | Status: DC

## 2012-01-24 MED ORDER — FAMOTIDINE IN NACL 20-0.9 MG/50ML-% IV SOLN: 20.0000 mg | Freq: Two times a day (BID) | INTRAVENOUS | Status: DC

## 2012-01-24 MED ORDER — INSULIN REGULAR BOLUS VIA INFUSION
0.0000 [IU] | Freq: Three times a day (TID) | INTRAVENOUS | Status: DC
  Filled 2012-01-24: qty 10

## 2012-01-24 MED ORDER — TIMOLOL MALEATE 0.5 % OP SOLN
1.0000 [drp] | Freq: Two times a day (BID) | OPHTHALMIC | Status: DC
  Administered 2012-01-25: 1 [drp] via OPHTHALMIC
  Filled 2012-01-24 (×2): qty 5

## 2012-01-24 MED ORDER — ALBUTEROL SULFATE (5 MG/ML) 0.5% IN NEBU: 2.5000 mg | INHALATION_SOLUTION | Freq: Four times a day (QID) | RESPIRATORY_TRACT | Status: DC | PRN

## 2012-01-24 MED ORDER — DEXTROSE 5 % IV SOLN
750.0000 mg | INTRAVENOUS | Status: DC
  Filled 2012-01-24: qty 750

## 2012-01-24 MED ORDER — DOCUSATE SODIUM 100 MG PO CAPS
200.0000 mg | ORAL_CAPSULE | Freq: Every day | ORAL | Status: DC
  Administered 2012-01-25 – 2012-01-29 (×3): 200 mg via ORAL
  Filled 2012-01-24 (×5): qty 2

## 2012-01-24 MED ORDER — EPINEPHRINE HCL 1 MG/ML IJ SOLN
0.5000 ug/min | INTRAVENOUS | Status: DC
  Filled 2012-01-24: qty 4

## 2012-01-24 MED ORDER — ASPIRIN EC 325 MG PO TBEC
325.0000 mg | DELAYED_RELEASE_TABLET | Freq: Every day | ORAL | Status: DC
  Administered 2012-01-25 – 2012-01-29 (×5): 325 mg via ORAL
  Filled 2012-01-24 (×5): qty 1

## 2012-01-24 MED ORDER — SODIUM CHLORIDE 0.45 % IV SOLN: INTRAVENOUS | Status: DC

## 2012-01-24 MED ORDER — BRIMONIDINE TARTRATE 0.2 % OP SOLN
1.0000 [drp] | Freq: Two times a day (BID) | OPHTHALMIC | Status: DC
  Administered 2012-01-25 – 2012-01-29 (×8): 1 [drp] via OPHTHALMIC
  Filled 2012-01-24 (×2): qty 5

## 2012-01-24 MED ORDER — LORATADINE 10 MG PO TABS
10.0000 mg | ORAL_TABLET | Freq: Every day | ORAL | Status: DC
  Administered 2012-01-24 – 2012-01-29 (×6): 10 mg via ORAL
  Filled 2012-01-24 (×6): qty 1

## 2012-01-24 MED ORDER — PHENYLEPHRINE HCL 10 MG/ML IJ SOLN
0.0000 ug/min | INTRAVENOUS | Status: DC
  Filled 2012-01-24: qty 2

## 2012-01-24 MED ORDER — SODIUM CHLORIDE 0.9 % IV SOLN
INTRAVENOUS | Status: DC
  Administered 2012-01-24: 2.1 [IU]/h via INTRAVENOUS
  Filled 2012-01-24: qty 1

## 2012-01-24 MED ORDER — SODIUM CHLORIDE 0.9 % IV SOLN
INTRAVENOUS | Status: DC | PRN
  Administered 2012-01-24: 19:00:00 via INTRAVENOUS

## 2012-01-24 MED ORDER — HEMOSTATIC AGENTS (NO CHARGE) OPTIME
TOPICAL | Status: DC | PRN
  Administered 2012-01-24: 1 via TOPICAL

## 2012-01-24 MED ORDER — ACETAMINOPHEN 160 MG/5ML PO SOLN: 975.0000 mg | Freq: Four times a day (QID) | ORAL | Status: DC

## 2012-01-24 MED ORDER — NITROGLYCERIN IN D5W 200-5 MCG/ML-% IV SOLN: 0.0000 ug/min | INTRAVENOUS | Status: DC

## 2012-01-24 MED ORDER — ALBUMIN HUMAN 5 % IV SOLN: 250.0000 mL | INTRAVENOUS | Status: DC | PRN

## 2012-01-24 MED ORDER — SODIUM CHLORIDE 0.9 % IV SOLN: 1.0000 mL/kg/h | INTRAVENOUS | Status: DC

## 2012-01-24 MED ORDER — FAMOTIDINE IN NACL 20-0.9 MG/50ML-% IV SOLN
20.0000 mg | Freq: Two times a day (BID) | INTRAVENOUS | Status: DC
  Administered 2012-01-24: 20 mg via INTRAVENOUS

## 2012-01-24 MED ORDER — METOPROLOL TARTRATE 1 MG/ML IV SOLN: 2.5000 mg | INTRAVENOUS | Status: DC | PRN

## 2012-01-24 MED ORDER — ACETAMINOPHEN 500 MG PO TABS: 1000.0000 mg | ORAL_TABLET | Freq: Four times a day (QID) | ORAL | Status: DC

## 2012-01-24 MED ORDER — DEXTROSE 5 % IV SOLN
0.0000 ug/min | INTRAVENOUS | Status: DC
  Administered 2012-01-24: 5 ug/min via INTRAVENOUS
  Filled 2012-01-24: qty 2

## 2012-01-24 MED ORDER — SODIUM BICARBONATE 8.4 % IV SOLN
INTRAVENOUS | Status: AC
  Administered 2012-01-24: 18:00:00
  Filled 2012-01-24 (×2): qty 2.5

## 2012-01-24 MED ORDER — BISACODYL 5 MG PO TBEC
10.0000 mg | DELAYED_RELEASE_TABLET | Freq: Every day | ORAL | Status: DC
  Administered 2012-01-25 – 2012-01-29 (×3): 10 mg via ORAL
  Filled 2012-01-24 (×3): qty 2

## 2012-01-24 MED ORDER — MIDAZOLAM HCL 2 MG/2ML IJ SOLN
INTRAMUSCULAR | Status: AC
  Filled 2012-01-24: qty 2

## 2012-01-24 MED ORDER — ALBUTEROL SULFATE HFA 108 (90 BASE) MCG/ACT IN AERS: 1.0000 | INHALATION_SPRAY | RESPIRATORY_TRACT | Status: DC | PRN

## 2012-01-24 MED ORDER — VANCOMYCIN HCL 1000 MG IV SOLR
1500.0000 mg | INTRAVENOUS | Status: DC
  Administered 2012-01-24: 1500 mg via INTRAVENOUS
  Filled 2012-01-24: qty 1500

## 2012-01-24 MED ORDER — FENTANYL CITRATE 0.05 MG/ML IJ SOLN
INTRAMUSCULAR | Status: AC
  Filled 2012-01-24: qty 2

## 2012-01-24 MED ORDER — ACETAMINOPHEN 160 MG/5ML PO SOLN: 650.0000 mg | ORAL | Status: DC

## 2012-01-24 MED ORDER — SODIUM CHLORIDE 0.9 % IJ SOLN: 3.0000 mL | Freq: Two times a day (BID) | INTRAMUSCULAR | Status: DC

## 2012-01-24 MED ORDER — NITROGLYCERIN 0.2 MG/ML ON CALL CATH LAB
INTRAVENOUS | Status: AC
  Filled 2012-01-24: qty 1

## 2012-01-24 MED ORDER — MIDAZOLAM HCL 2 MG/2ML IJ SOLN: 2.0000 mg | INTRAMUSCULAR | Status: DC | PRN

## 2012-01-24 MED ORDER — BIMATOPROST 0.01 % OP SOLN
1.0000 [drp] | Freq: Every day | OPHTHALMIC | Status: DC
  Administered 2012-01-25 – 2012-01-28 (×4): 1 [drp] via OPHTHALMIC
  Filled 2012-01-24 (×2): qty 2.5

## 2012-01-24 MED ORDER — LACTATED RINGERS IV SOLN: INTRAVENOUS | Status: DC

## 2012-01-24 MED ORDER — ACETAMINOPHEN 650 MG RE SUPP
650.0000 mg | RECTAL | Status: DC
  Administered 2012-01-24: 650 mg via RECTAL

## 2012-01-24 MED ORDER — SODIUM CHLORIDE 0.9 % IV SOLN: 250.0000 mL | INTRAVENOUS | Status: DC

## 2012-01-24 MED ORDER — LACTATED RINGERS IV SOLN
INTRAVENOUS | Status: DC | PRN
  Administered 2012-01-24 (×3): via INTRAVENOUS

## 2012-01-24 MED ORDER — MAGNESIUM SULFATE 40 MG/ML IJ SOLN: 4.0000 g | Freq: Once | INTRAMUSCULAR | Status: DC

## 2012-01-24 MED ORDER — METOPROLOL TARTRATE 25 MG/10 ML ORAL SUSPENSION
12.5000 mg | Freq: Two times a day (BID) | ORAL | Status: DC
  Filled 2012-01-24 (×3): qty 5

## 2012-01-24 MED ORDER — DOPAMINE-DEXTROSE 3.2-5 MG/ML-% IV SOLN: 0.0000 ug/kg/min | INTRAVENOUS | Status: DC

## 2012-01-24 MED ORDER — DOPAMINE-DEXTROSE 3.2-5 MG/ML-% IV SOLN
2.0000 ug/kg/min | INTRAVENOUS | Status: DC
  Filled 2012-01-24: qty 250

## 2012-01-24 MED ORDER — NITROGLYCERIN IN D5W 200-5 MCG/ML-% IV SOLN
2.0000 ug/min | INTRAVENOUS | Status: DC
  Filled 2012-01-24: qty 250

## 2012-01-24 MED ORDER — TRANEXAMIC ACID (OHS) PUMP PRIME SOLUTION
2.0000 mg/kg | INTRAVENOUS | Status: DC
  Filled 2012-01-24: qty 1.74

## 2012-01-24 MED ORDER — SODIUM BICARBONATE 8.4 % IV SOLN
INTRAVENOUS | Status: DC
  Filled 2012-01-24: qty 2.5

## 2012-01-24 MED ORDER — DORZOLAMIDE HCL 2 % OP SOLN
1.0000 [drp] | Freq: Three times a day (TID) | OPHTHALMIC | Status: DC
  Administered 2012-01-25 – 2012-01-29 (×12): 1 [drp] via OPHTHALMIC
  Filled 2012-01-24 (×2): qty 10

## 2012-01-24 MED ORDER — METOPROLOL TARTRATE 12.5 MG HALF TABLET: 12.5000 mg | ORAL_TABLET | Freq: Two times a day (BID) | ORAL | Status: DC

## 2012-01-24 MED ORDER — PHENYLEPHRINE HCL 10 MG/ML IJ SOLN
30.0000 ug/min | INTRAVENOUS | Status: DC
  Filled 2012-01-24: qty 2

## 2012-01-24 MED ORDER — ONDANSETRON HCL 4 MG/2ML IJ SOLN
4.0000 mg | Freq: Four times a day (QID) | INTRAMUSCULAR | Status: DC | PRN
  Administered 2012-01-25: 4 mg via INTRAVENOUS
  Filled 2012-01-24: qty 2

## 2012-01-24 MED ORDER — POTASSIUM CHLORIDE 10 MEQ/50ML IV SOLN: 10.0000 meq | INTRAVENOUS | Status: DC

## 2012-01-24 MED ORDER — DEXMEDETOMIDINE HCL 100 MCG/ML IV SOLN
0.1000 ug/kg/h | INTRAVENOUS | Status: DC
  Filled 2012-01-24: qty 2

## 2012-01-24 MED ORDER — POTASSIUM CHLORIDE 2 MEQ/ML IV SOLN
80.0000 meq | INTRAVENOUS | Status: DC
  Filled 2012-01-24: qty 40

## 2012-01-24 MED ORDER — ACETAMINOPHEN 160 MG/5ML PO SOLN: 650.0000 mg | ORAL | Status: AC

## 2012-01-24 MED ORDER — MAGNESIUM SULFATE 50 % IJ SOLN
40.0000 meq | INTRAMUSCULAR | Status: DC
  Filled 2012-01-24: qty 10

## 2012-01-24 MED ORDER — VANCOMYCIN HCL IN DEXTROSE 1-5 GM/200ML-% IV SOLN
1000.0000 mg | Freq: Once | INTRAVENOUS | Status: AC
  Administered 2012-01-25: 1000 mg via INTRAVENOUS
  Filled 2012-01-24: qty 200

## 2012-01-24 MED ORDER — DOCUSATE SODIUM 100 MG PO CAPS: 200.0000 mg | ORAL_CAPSULE | Freq: Every day | ORAL | Status: DC

## 2012-01-24 MED ORDER — SODIUM CHLORIDE 0.9 % IJ SOLN
OROMUCOSAL | Status: DC | PRN
  Administered 2012-01-24: 18:00:00 via TOPICAL

## 2012-01-24 MED ORDER — 0.9 % SODIUM CHLORIDE (POUR BTL) OPTIME
TOPICAL | Status: DC | PRN
  Administered 2012-01-24: 6000 mL

## 2012-01-24 MED ORDER — MORPHINE SULFATE 2 MG/ML IJ SOLN: 2.0000 mg | INTRAMUSCULAR | Status: DC | PRN

## 2012-01-24 MED ORDER — MORPHINE SULFATE 2 MG/ML IJ SOLN
2.0000 mg | INTRAMUSCULAR | Status: DC | PRN
  Administered 2012-01-25 (×2): 2 mg via INTRAVENOUS
  Filled 2012-01-24 (×2): qty 1

## 2012-01-24 MED ORDER — PROTAMINE SULFATE 10 MG/ML IV SOLN
INTRAVENOUS | Status: DC | PRN
  Administered 2012-01-24: 350 mg via INTRAVENOUS

## 2012-01-24 MED ORDER — OXYCODONE HCL 5 MG PO TABS
5.0000 mg | ORAL_TABLET | ORAL | Status: DC | PRN
  Administered 2012-01-25 – 2012-01-27 (×5): 5 mg via ORAL
  Filled 2012-01-24: qty 2
  Filled 2012-01-24 (×2): qty 1
  Filled 2012-01-24: qty 2
  Filled 2012-01-24: qty 1

## 2012-01-24 MED ORDER — SALINE SPRAY 0.65 % NA SOLN
1.0000 | NASAL | Status: DC | PRN
  Filled 2012-01-24: qty 44

## 2012-01-24 MED ORDER — SODIUM CHLORIDE 0.9 % IJ SOLN: 3.0000 mL | INTRAMUSCULAR | Status: DC | PRN

## 2012-01-24 MED ORDER — SODIUM CHLORIDE 0.45 % IV SOLN
INTRAVENOUS | Status: DC
  Administered 2012-01-24: 21:00:00 via INTRAVENOUS

## 2012-01-24 MED ORDER — TRANEXAMIC ACID (OHS) BOLUS VIA INFUSION
15.0000 mg/kg | INTRAVENOUS | Status: DC
  Filled 2012-01-24: qty 1302

## 2012-01-24 MED ORDER — METOPROLOL TARTRATE 1 MG/ML IV SOLN
INTRAVENOUS | Status: AC
  Filled 2012-01-24: qty 5

## 2012-01-24 MED ORDER — FLUTICASONE PROPIONATE 50 MCG/ACT NA SUSP
1.0000 | Freq: Every day | NASAL | Status: DC
  Administered 2012-01-24 – 2012-01-29 (×5): 1 via NASAL
  Filled 2012-01-24 (×2): qty 16

## 2012-01-24 MED ORDER — VANCOMYCIN HCL IN DEXTROSE 1-5 GM/200ML-% IV SOLN: 1000.0000 mg | Freq: Once | INTRAVENOUS | Status: DC

## 2012-01-24 MED ORDER — POTASSIUM CHLORIDE 10 MEQ/50ML IV SOLN: 10.0000 meq | INTRAVENOUS | Status: AC

## 2012-01-24 MED ORDER — ASPIRIN 81 MG PO CHEW: 324.0000 mg | CHEWABLE_TABLET | Freq: Every day | ORAL | Status: DC

## 2012-01-24 MED ORDER — SODIUM CHLORIDE 0.9 % IV SOLN: 250.0000 mL | INTRAVENOUS | Status: DC | PRN

## 2012-01-24 MED ORDER — MIDAZOLAM HCL 5 MG/5ML IJ SOLN
INTRAMUSCULAR | Status: DC | PRN
  Administered 2012-01-24: 4 mg via INTRAVENOUS
  Administered 2012-01-24: 1 mg via INTRAVENOUS
  Administered 2012-01-24: 5 mg via INTRAVENOUS

## 2012-01-24 MED ORDER — ACETAMINOPHEN 650 MG RE SUPP
650.0000 mg | RECTAL | Status: AC
  Administered 2012-01-24: 650 mg via RECTAL

## 2012-01-24 MED ORDER — TRANEXAMIC ACID 100 MG/ML IV SOLN
1.5000 mg/kg/h | INTRAVENOUS | Status: DC
  Administered 2012-01-24: 1.5 mg/kg/h via INTRAVENOUS
  Filled 2012-01-24: qty 25

## 2012-01-24 MED ORDER — OXYCODONE HCL 5 MG PO TABS: 5.0000 mg | ORAL_TABLET | ORAL | Status: DC | PRN

## 2012-01-24 MED ORDER — ACETAMINOPHEN 500 MG PO TABS
1000.0000 mg | ORAL_TABLET | Freq: Four times a day (QID) | ORAL | Status: DC
  Administered 2012-01-25 – 2012-01-29 (×16): 1000 mg via ORAL
  Filled 2012-01-24 (×18): qty 2

## 2012-01-24 MED ORDER — METOPROLOL TARTRATE 25 MG/10 ML ORAL SUSPENSION: 12.5000 mg | Freq: Two times a day (BID) | ORAL | Status: DC

## 2012-01-24 MED ORDER — ROCURONIUM BROMIDE 100 MG/10ML IV SOLN
INTRAVENOUS | Status: DC | PRN
  Administered 2012-01-24: 100 mg via INTRAVENOUS

## 2012-01-24 MED ORDER — DEXTROSE 5 % IV SOLN
1.5000 g | Freq: Two times a day (BID) | INTRAVENOUS | Status: DC
  Administered 2012-01-24: 1.5 g via INTRAVENOUS
  Filled 2012-01-24 (×2): qty 1.5

## 2012-01-24 MED ORDER — SODIUM CHLORIDE 0.9 % IV SOLN: 0.1000 ug/kg/h | INTRAVENOUS | Status: DC

## 2012-01-24 MED ORDER — CALCIUM CHLORIDE 10 % IV SOLN
1.0000 g | Freq: Once | INTRAVENOUS | Status: AC | PRN
  Administered 2012-01-24: 1 g via INTRAVENOUS
  Filled 2012-01-24: qty 10

## 2012-01-24 MED ORDER — ASPIRIN EC 325 MG PO TBEC: 325.0000 mg | DELAYED_RELEASE_TABLET | Freq: Every day | ORAL | Status: DC

## 2012-01-24 MED ORDER — LIDOCAINE HCL (PF) 1 % IJ SOLN
INTRAMUSCULAR | Status: AC
  Filled 2012-01-24: qty 30

## 2012-01-24 MED ORDER — PHENYLEPHRINE HCL 10 MG/ML IJ SOLN
20.0000 mg | INTRAVENOUS | Status: DC | PRN
  Administered 2012-01-24: 5 ug/min via INTRAVENOUS

## 2012-01-24 MED ORDER — BRIMONIDINE TARTRATE-TIMOLOL 0.2-0.5 % OP SOLN
1.0000 [drp] | Freq: Two times a day (BID) | OPHTHALMIC | Status: DC
  Filled 2012-01-24 (×18): qty 0.1

## 2012-01-24 MED ORDER — DEXTROSE 5 % IV SOLN: 1.5000 g | Freq: Two times a day (BID) | INTRAVENOUS | Status: DC

## 2012-01-24 MED ORDER — PROPOFOL 10 MG/ML IV EMUL
INTRAVENOUS | Status: DC | PRN
  Administered 2012-01-24: 50 mg via INTRAVENOUS

## 2012-01-24 MED ORDER — FENTANYL CITRATE 0.05 MG/ML IJ SOLN
INTRAMUSCULAR | Status: DC | PRN
  Administered 2012-01-24: 750 ug via INTRAVENOUS
  Administered 2012-01-24: 50 ug via INTRAVENOUS
  Administered 2012-01-24: 250 ug via INTRAVENOUS
  Administered 2012-01-24 (×3): 50 ug via INTRAVENOUS

## 2012-01-24 MED ORDER — ASPIRIN 81 MG PO CHEW: 324.0000 mg | CHEWABLE_TABLET | ORAL | Status: DC

## 2012-01-24 MED ORDER — BISACODYL 5 MG PO TBEC: 10.0000 mg | DELAYED_RELEASE_TABLET | Freq: Every day | ORAL | Status: DC

## 2012-01-24 MED ORDER — HEPARIN (PORCINE) IN NACL 2-0.9 UNIT/ML-% IJ SOLN
INTRAMUSCULAR | Status: AC
  Filled 2012-01-24: qty 2000

## 2012-01-24 MED ORDER — SODIUM CHLORIDE 0.9 % IV SOLN
0.1000 ug/kg/h | INTRAVENOUS | Status: DC
  Administered 2012-01-24: .2 ug/kg/h via INTRAVENOUS
  Filled 2012-01-24: qty 4

## 2012-01-24 SURGICAL SUPPLY — 128 items
ADAPTER CARDIO PERF ANTE/RETRO (ADAPTER) ×1 IMPLANT
ADPR PRFSN 84XANTGRD RTRGD (ADAPTER) ×1
APL SKNCLS STERI-STRIP NONHPOA (GAUZE/BANDAGES/DRESSINGS) ×1
APPLIER CLIP 9.375 MED OPEN (MISCELLANEOUS)
APPLIER CLIP 9.375 SM OPEN (CLIP)
APR CLP MED 9.3 20 MLT OPN (MISCELLANEOUS)
APR CLP SM 9.3 20 MLT OPN (CLIP)
ATTRACTOMAT 16X20 MAGNETIC DRP (DRAPES) ×2 IMPLANT
BAG DECANTER FOR FLEXI CONT (MISCELLANEOUS) ×3 IMPLANT
BANDAGE ELASTIC 4 VELCRO ST LF (GAUZE/BANDAGES/DRESSINGS) ×2 IMPLANT
BANDAGE ELASTIC 6 VELCRO ST LF (GAUZE/BANDAGES/DRESSINGS) ×2 IMPLANT
BANDAGE GAUZE ELAST BULKY 4 IN (GAUZE/BANDAGES/DRESSINGS) ×2 IMPLANT
BASKET HEART (ORDER IN 25'S) (MISCELLANEOUS) ×1
BASKET HEART (ORDER IN 25S) (MISCELLANEOUS) ×1 IMPLANT
BENZOIN TINCTURE PRP APPL 2/3 (GAUZE/BANDAGES/DRESSINGS) ×2 IMPLANT
BLADE STERNUM SYSTEM 6 (BLADE) ×2 IMPLANT
BLADE SURG 11 STRL SS (BLADE) ×2 IMPLANT
BLADE SURG ROTATE 9660 (MISCELLANEOUS) ×2 IMPLANT
CANISTER SUCTION 2500CC (MISCELLANEOUS) ×2 IMPLANT
CANNULA GUNDRY RCSP 15FR (MISCELLANEOUS) ×1 IMPLANT
CATH CPB KIT OWEN (MISCELLANEOUS) ×2 IMPLANT
CATH ROBINSON RED A/P 18FR (CATHETERS) ×2 IMPLANT
CATH THORACIC 28FR (CATHETERS) IMPLANT
CATH THORACIC 28FR RT ANG (CATHETERS) IMPLANT
CATH THORACIC 36FR (CATHETERS) ×2 IMPLANT
CATH THORACIC 36FR RT ANG (CATHETERS) ×2 IMPLANT
CLIP APPLIE 9.375 MED OPEN (MISCELLANEOUS) IMPLANT
CLIP APPLIE 9.375 SM OPEN (CLIP) IMPLANT
CLIP FOGARTY SPRING 6M (CLIP) IMPLANT
CLIP TI MEDIUM 24 (CLIP) IMPLANT
CLIP TI WIDE RED SMALL 24 (CLIP) IMPLANT
CLOTH BEACON ORANGE TIMEOUT ST (SAFETY) ×2 IMPLANT
CLSR STERI-STRIP ANTIMIC 1/2X4 (GAUZE/BANDAGES/DRESSINGS) ×2 IMPLANT
CONN Y 3/8X3/8X3/8  BEN (MISCELLANEOUS)
CONN Y 3/8X3/8X3/8 BEN (MISCELLANEOUS) IMPLANT
COVER SURGICAL LIGHT HANDLE (MISCELLANEOUS) ×4 IMPLANT
CRADLE DONUT ADULT HEAD (MISCELLANEOUS) ×2 IMPLANT
DRAIN CHANNEL 32F RND 10.7 FF (WOUND CARE) ×2 IMPLANT
DRAPE CARDIOVASCULAR INCISE (DRAPES) ×2
DRAPE SLUSH/WARMER DISC (DRAPES) ×2 IMPLANT
DRAPE SRG 135X102X78XABS (DRAPES) ×1 IMPLANT
DRSG COVADERM 4X14 (GAUZE/BANDAGES/DRESSINGS) ×2 IMPLANT
ELECT REM PT RETURN 9FT ADLT (ELECTROSURGICAL) ×4
ELECTRODE REM PT RTRN 9FT ADLT (ELECTROSURGICAL) ×2 IMPLANT
GLOVE BIO SURGEON STRL SZ 6 (GLOVE) IMPLANT
GLOVE BIO SURGEON STRL SZ 6.5 (GLOVE) ×1 IMPLANT
GLOVE BIO SURGEON STRL SZ7 (GLOVE) IMPLANT
GLOVE BIO SURGEON STRL SZ7.5 (GLOVE) IMPLANT
GLOVE BIOGEL PI IND STRL 6 (GLOVE) IMPLANT
GLOVE BIOGEL PI IND STRL 6.5 (GLOVE) IMPLANT
GLOVE BIOGEL PI IND STRL 7.0 (GLOVE) ×1 IMPLANT
GLOVE BIOGEL PI INDICATOR 6 (GLOVE) ×2
GLOVE BIOGEL PI INDICATOR 6.5 (GLOVE)
GLOVE BIOGEL PI INDICATOR 7.0 (GLOVE) ×1
GLOVE EUDERMIC 7 POWDERFREE (GLOVE) IMPLANT
GLOVE ORTHO TXT STRL SZ7.5 (GLOVE) ×2 IMPLANT
GLOVE SS N UNI LF 6.0 STRL (GLOVE) ×3 IMPLANT
GLOVE SS N UNI LF 6.5 STRL (GLOVE) ×2 IMPLANT
GLOVE SS N UNI LF 7.0 STRL (GLOVE) ×4 IMPLANT
GLOVE SS N UNI LF 7.5 STRL (GLOVE) ×6 IMPLANT
GLOVE SURG SS PI 7.5 STRL IVOR (GLOVE) ×2 IMPLANT
GOWN PREVENTION PLUS XLARGE (GOWN DISPOSABLE) ×2 IMPLANT
GOWN STRL NON-REIN LRG LVL3 (GOWN DISPOSABLE) ×14 IMPLANT
HEMOSTAT POWDER SURGIFOAM 1G (HEMOSTASIS) ×6 IMPLANT
INSERT FOGARTY 61MM (MISCELLANEOUS) IMPLANT
INSERT FOGARTY XLG (MISCELLANEOUS) ×2 IMPLANT
KIT BASIN OR (CUSTOM PROCEDURE TRAY) ×2 IMPLANT
KIT ROOM TURNOVER OR (KITS) ×2 IMPLANT
KIT SUCTION CATH 14FR (SUCTIONS) ×4 IMPLANT
KIT VASOVIEW W/TROCAR VH 2000 (KITS) ×2 IMPLANT
LEAD PACING MYOCARDI (MISCELLANEOUS) ×2 IMPLANT
MARKER GRAFT CORONARY BYPASS (MISCELLANEOUS) ×6 IMPLANT
NS IRRIG 1000ML POUR BTL (IV SOLUTION) ×11 IMPLANT
PACK OPEN HEART (CUSTOM PROCEDURE TRAY) ×2 IMPLANT
PAD ARMBOARD 7.5X6 YLW CONV (MISCELLANEOUS) ×2 IMPLANT
PENCIL BUTTON HOLSTER BLD 10FT (ELECTRODE) ×2 IMPLANT
PUNCH AORTIC ROTATE 4.0MM (MISCELLANEOUS) ×1 IMPLANT
PUNCH AORTIC ROTATE 4.5MM 8IN (MISCELLANEOUS) IMPLANT
PUNCH AORTIC ROTATE 5MM 8IN (MISCELLANEOUS) IMPLANT
SET CARDIOPLEGIA MPS 5001102 (MISCELLANEOUS) ×1 IMPLANT
SOLUTION ANTI FOG 6CC (MISCELLANEOUS) IMPLANT
SPONGE GAUZE 4X4 12PLY (GAUZE/BANDAGES/DRESSINGS) ×4 IMPLANT
SPONGE LAP 18X18 X RAY DECT (DISPOSABLE) ×4 IMPLANT
SPONGE LAP 4X18 X RAY DECT (DISPOSABLE) IMPLANT
STRIP CLOSURE SKIN 1/2X4 (GAUZE/BANDAGES/DRESSINGS) ×1 IMPLANT
SUT BONE WAX W31G (SUTURE) ×2 IMPLANT
SUT ETHIBOND X763 2 0 SH 1 (SUTURE) ×4 IMPLANT
SUT MNCRL AB 3-0 PS2 18 (SUTURE) ×4 IMPLANT
SUT MNCRL AB 4-0 PS2 18 (SUTURE) ×1 IMPLANT
SUT PDS AB 1 CTX 36 (SUTURE) ×4 IMPLANT
SUT PROLENE 2 0 SH DA (SUTURE) IMPLANT
SUT PROLENE 3 0 SH DA (SUTURE) ×3 IMPLANT
SUT PROLENE 3 0 SH1 36 (SUTURE) IMPLANT
SUT PROLENE 4 0 RB 1 (SUTURE) ×4
SUT PROLENE 4 0 SH DA (SUTURE) ×1 IMPLANT
SUT PROLENE 4-0 RB1 .5 CRCL 36 (SUTURE) IMPLANT
SUT PROLENE 5 0 C 1 36 (SUTURE) IMPLANT
SUT PROLENE 6 0 C 1 30 (SUTURE) ×3 IMPLANT
SUT PROLENE 7.0 RB 3 (SUTURE) ×6 IMPLANT
SUT PROLENE 8 0 BV175 6 (SUTURE) IMPLANT
SUT PROLENE BLUE 7 0 (SUTURE) ×2 IMPLANT
SUT PROLENE POLY MONO (SUTURE) IMPLANT
SUT SILK  1 MH (SUTURE) ×1
SUT SILK 1 MH (SUTURE) ×1 IMPLANT
SUT STEEL 6MS V (SUTURE) IMPLANT
SUT STEEL STERNAL CCS#1 18IN (SUTURE) IMPLANT
SUT STEEL SZ 6 DBL 3X14 BALL (SUTURE) IMPLANT
SUT VIC AB 1 CTX 18 (SUTURE) ×1 IMPLANT
SUT VIC AB 1 CTX 36 (SUTURE)
SUT VIC AB 1 CTX36XBRD ANBCTR (SUTURE) IMPLANT
SUT VIC AB 2-0 CT1 27 (SUTURE) ×4
SUT VIC AB 2-0 CT1 TAPERPNT 27 (SUTURE) IMPLANT
SUT VIC AB 2-0 CTX 27 (SUTURE) IMPLANT
SUT VIC AB 3-0 SH 27 (SUTURE)
SUT VIC AB 3-0 SH 27X BRD (SUTURE) IMPLANT
SUT VIC AB 3-0 X1 27 (SUTURE) IMPLANT
SUT VICRYL 4-0 PS2 18IN ABS (SUTURE) IMPLANT
SUTURE E-PAK OPEN HEART (SUTURE) ×2 IMPLANT
SYSTEM SAHARA CHEST DRAIN ATS (WOUND CARE) ×2 IMPLANT
TAPE CLOTH SURG 4X10 WHT LF (GAUZE/BANDAGES/DRESSINGS) ×2 IMPLANT
TOWEL OR 17X24 6PK STRL BLUE (TOWEL DISPOSABLE) ×4 IMPLANT
TOWEL OR 17X26 10 PK STRL BLUE (TOWEL DISPOSABLE) ×4 IMPLANT
TRAY FOLEY CATH 16FRSI W/METER (SET/KITS/TRAYS/PACK) ×1 IMPLANT
TRAY FOLEY IC TEMP SENS 14FR (CATHETERS) ×1 IMPLANT
TUBE SUCT INTRACARD DLP 20F (MISCELLANEOUS) ×2 IMPLANT
TUBING INSUFFLATION 10FT LAP (TUBING) ×2 IMPLANT
UNDERPAD 30X30 INCONTINENT (UNDERPADS AND DIAPERS) ×2 IMPLANT
WATER STERILE IRR 1000ML POUR (IV SOLUTION) ×4 IMPLANT

## 2012-01-24 NOTE — Brief Op Note (Addendum)
01/23/2012 - 01/24/2012  6:40 PM  PATIENT:  Carolyn Fleming  61 y.o. female  PRE-OPERATIVE DIAGNOSIS:  CAD, L main disease  POST-OPERATIVE DIAGNOSIS:  Coronary Artery Disease, L main disease  PROCEDURE:  Procedure(s): EMERGENCY CORONARY ARTERY BYPASS GRAFTING x 2 (LIMA-LAD, SVG-Cx), EVH Right thigh  SURGEON:    Purcell Nails, MD  ASSISTANTS:  Coral Ceo, PA-C  ANESTHESIA:   Kerby Nora, MD  CROSSCLAMP TIME:   24'  CARDIOPULMONARY BYPASS TIME: 28'  FINDINGS:  Mild LV dysfunction with severe distal anteroapical akinesis  Good quality LIMA and SVG conduit  Good quality target vessels  COMPLICATIONS: none  PATIENT DISPOSITION:   TO SICU IN STABLE CONDITION  Sabryna Lahm H 01/24/2012 7:57 PM

## 2012-01-24 NOTE — CV Procedure (Signed)
     Diagnostic Cardiac Catheterization Report  Carolyn Fleming  61 y.o.  female 30-Jul-1951  Procedure Date: 01/24/2012 Referring Physician: Patsi Sears at [facility] Primary Cardiologist:: Wayne Both, III, MD  PROCEDURE:  Left heart catheterization with selective coronary angiography, left ventriculogram.  INDICATIONS:  Acute coronary syndrome with prolonged chest pain after finding that her brother died suddenly 36 hours ago. Cardiac markers are positive and the patient's EKG demonstrates anterior T-wave inversion with poor R wave progression. She has been pain-free since IV nitroglycerin was started on admission.  The risks, benefits, and details of the procedure were explained to the patient.  The patient verbalized understanding and wanted to proceed.  Informed written consent was obtained.  PROCEDURE TECHNIQUE:  After Xylocaine anesthesia a 5 sheath was placed in the right femoral artery with a single anterior needle wall stick.   Coronary angiography was done using a 5 [place] A2 MP catheter.  Left ventriculography was done using a 5 [place] A2 MP catheter.    CONTRAST:  Total of  80 cc.  COMPLICATIONS:  None.    HEMODYNAMICS:  Aortic pressure was 156/86 mmHg; LV pressure was 157/10 mmHg; LVEDP 16 mm mercury.  There was no gradient between the left ventricle and aorta.    ANGIOGRAPHIC DATA:   The left main coronary artery is greater than 90% obstructed and appears hazy within the distal segment..  The left anterior descending artery is segmental 80-90% stenosis starting of the left main. 99% stenosis with TIMI grade 1-2 flow beyond the obstruction.  The left circumflex artery is 60-70% stenosed in the mid segment prior to the origin of a bifurcating distal circumflex territory. A small first obtuse marginal arises proximal to the stenosis..  The right coronary artery is codominant with no significant obstruction.  LEFT VENTRICULOGRAM:  Left ventricular angiogram was done in the  30 RAO projection and revealed abnormal left ventricular wall motion involving the anteroapical region. The systolic function overall is low normal with an estimated ejection fraction of 45-50 %.    IMPRESSIONS:  1. Severely distal left main coronary artery disease with greater than 90% obstruction and an appearance that suggests the presence of thrombus.  2. Segmental 80% ostial/proximal LAD and 99% obstruction in the distal vessel well before the apex. There is TIMI grade 1-2 flow through the mid LAD obstruction.  3. Moderate mid circumflex disease, 50-70%, before the origin of a large second and third marginal branch.  4. Distal anterior wall and apical severe hypokinesis. EF 50%   RECOMMENDATION:  Given the patient's presentation and the appearance of the left main, have spoken with Dr. Cornelius Moras, to encourage expeditious coronary artery bypass grafting, perhaps today.Marland Kitchen

## 2012-01-24 NOTE — Care Management Note (Unsigned)
    Page 1 of 2   01/26/2012     2:13:08 PM   CARE MANAGEMENT NOTE 01/26/2012  Patient:  Carolyn [place], Carolyn [place]   Account Number:  [id number]  Date Initiated:  01/24/2012  Documentation initiated by:  Junius Creamer  Subjective/Objective Assessment:   adm w mi     Action/Plan:   lives alone, pcp dr Carolyn [place]   Anticipated DC Date:     Anticipated DC Plan:        DC Planning Services  CM consult      Choice offered to / List presented to:             Status of service:  In process, will continue to follow Medicare Important Message given?   (If response is "NO", the following Medicare IM given date fields will be blank) Date Medicare IM given:   Date Additional Medicare IM given:    Discharge Disposition:    Per UR Regulation:  Reviewed for med. necessity/level of care/duration of stay  If discussed at Long Length of Stay Meetings, dates discussed:    Comments:  01/26/12 Dondre Catalfamo,RN,BSN 1220 MET WITH PT, SON AND SON'S GIRLFRIEND TO DISCUSS DC PLANS. GIRLFRIEND TO STAY WITH PT X 2 WEEKS AS DISCHARGE, WHILE SON GOES BACK AND FORTH TO [place].  MAY NEED RW AT DC . WILL CONT TO FOLLOW FOR HOME NEEDS.  01/25/12 Nakea Gouger,RN,BSN 1200 PT S/P CABG X 2 ON 01/24/12.  PTA, PT INDEPENDENT, LIVES ALONE.  PT HAS ONE ADULT CHILD WHO LIVES IN [place] [facility] [phone]).  SON STATES MOTHER WOULD NOT WANT TO GO TO SNF FOR REHAB.  HE STATES THAT HE WILL SPEAK WITH HIS MOTHER REGARDING DC PLANS AND FOLLOW UP WITH ME IN AM. WILL DISCUSS PLANS WITH PT AND SON IN [name]    6/10 10:20a debbie dowell rn,bsn [phone]

## 2012-01-24 NOTE — Op Note (Signed)
CARDIOTHORACIC SURGERY OPERATIVE NOTE  Date of Procedure: 01/24/2012  Preoperative Diagnosis:   Severe Left Main Coronary Artery Disease  S/P Acute Myocardial Infarction  Postoperative Diagnosis: Same  Procedure:   Coronary Artery Bypass Grafting x 2   Left Internal Mammary Artery to Distal Left Anterior Descending Coronary Artery  Saphenous Vein Graft to First Obtuse Marginal Branch of Left Circumflex Coronary Artery  Endoscopic Vein Harvest from Right Thigh  Surgeon: Salvatore Decent. Cornelius Moras, MD  Assistant: Coral Ceo, PA-C  Anesthesia: Cliffton Asters. Ivin Booty, MD and Marguerita Merles, MD  Operative Findings:  Mild global left ventricular dysfunction  Severe distal anteroapical akinesis  Good quality left internal mammary artery conduit  Good quality saphenous vein conduit  Good quality target vessels for grafting    BRIEF CLINICAL NOTE AND INDICATIONS FOR SURGERY  Patient is an obese 61 year old African American female with no previous history of coronary artery disease who was admitted to the hospital with an acute myocardial infarction. The patient has a presumed history of hypertension and hyperlipidemia. She describes a one month history of exertional chest pain described as central pressure and tightness in her chest radiating to her neck. She has attributed these symptoms to asthma and acid reflux disease. She was seen in an outpatient primary care clinic 2 days ago with similar symptoms, which were attributed to asthma. Yesterday she developed a more severe and persistent episode of chest tightness associated with nausea vomiting and diaphoresis. The pain persisted and became quite severe ultimately prompting her to present to the emergency department. She was admitted to the hospital with some EKG changes and positive cardiac enzymes. Chest pain ultimately was brought under control with intravenous nitroglycerin. She was brought to the cath lab this afternoon where she  underwent cardiac catheterization demonstrating critical left main disease with subtotal occlusion of left anterior descending coronary artery. Emergent cardiac surgical consultation was requested.  The patient has been seen in consultation and counseled at length regarding the indications, risks and potential benefits of surgery.  All questions have been answered, and the patient provides full informed consent for the operation as described.     DETAILS OF THE OPERATIVE PROCEDURE  The patient is brought to the operating room on the above mentioned date and central monitoring was established by the anesthesia team including placement of Swan-Ganz catheter and radial arterial line. The patient is placed in the supine position on the operating table.  Intravenous antibiotics are administered. General endotracheal anesthesia is induced uneventfully. A Foley catheter is placed.  Baseline transesophageal echocardiogram was performed.  Findings were notable for mild LV dysfunction.  There was hypokinesis or akinesis of the distal anterior wall.  The patient's chest, abdomen, both groins, and both lower extremities are prepared and draped in a sterile manner. A time out procedure is performed.  A median sternotomy incision was performed and the left internal mammary artery is dissected from the chest wall and prepared for bypass grafting. The left internal mammary artery is notably good quality conduit. Simultaneously, saphenous vein is obtained from the patient's right thigh using endoscopic vein harvest technique. The saphenous vein is notably good quality conduit. After removal of the saphenous vein, the small surgical incisions in the lower extremity are closed with absorbable suture. Following systemic heparinization, the left internal mammary artery was transected distally noted to have excellent flow.  The pericardium is opened. The ascending aorta is normal in appearance. The ascending aorta and the  right atrium are cannulated for cardioplegia bypass.  Adequate heparinization is verified.    A retrograde cardioplegia cannula is placed through the right atrium into the coronary sinus.  The entire pre-bypass portion of the operation was notable for stable hemodynamics.  Cardiopulmonary bypass was begun and the surface of the heart is inspected. There was evidence for acute transmural infarction of the distal anterior wall and apex.  Distal target vessels are selected for coronary artery bypass grafting. A cardioplegia cannula is placed in the ascending aorta.  A temperature probe was placed in the interventricular septum.  The patient is allowed to cool passively to [facility] systemic temperature.  The aortic cross clamp is applied and cold blood cardioplegia is delivered initially in an antegrade fashion through the aortic root.   Supplemental cardioplegia is given retrograde through the coronary sinus catheter.  Iced saline slush is applied for topical hypothermia.  The initial cardioplegic arrest is rapid with early diastolic arrest.  Repeat doses of cardioplegia are administered intermittently throughout the entire cross clamp portion of the operation through the aortic root,  through the coronary sinus catheter, and through subsequently placed vein grafts in order to maintain completely flat electrocardiogram and septal myocardial temperature below 15C.  Myocardial protection was felt to be excellent.  The following distal coronary artery bypass grafts were performed:   The first obtuse marginal branch of the left circumflex coronary artery was grafted using a reversed saphenous vein graft in an end-to-side fashion.  At the site of distal anastomosis the target vessel was good quality and measured approximately 1.8 mm in diameter.  The distal left anterior coronary artery was grafted with the left internal mammary artery in an end-to-side fashion.  At the site of distal anastomosis the target vessel  was good quality and measured approximately 1.5 mm in diameter.   All proximal vein graft anastomoses were placed directly to the ascending aorta prior to removal of the aortic cross clamp.  The septal myocardial temperature rose rapidly after reperfusion of the left internal mammary artery graft.  The aortic cross clamp was removed after a total cross clamp time of 38 minutes.  All proximal and distal coronary anastomoses were inspected for hemostasis and appropriate graft orientation. Epicardial pacing wires are fixed to the right ventricular outflow tract and to the right atrial appendage. The patient is rewarmed to 37C temperature. The patient is weaned and disconnected from cardiopulmonary bypass.  The patient's rhythm at separation from bypass was sinus.  The patient was weaned from cardioplegic bypass  without any inotropic support. Total cardiopulmonary bypass time for the operation was 56 minutes.  Followup transesophageal echocardiogram performed after separation from bypass revealed  no changes from the preoperative exam.  The aortic and venous cannula were removed uneventfully. Protamine was administered to reverse the anticoagulation. The mediastinum and pleural space were inspected for hemostasis and irrigated with saline solution. The mediastinum and the left pleural space were drained using 3 chest tubes placed through separate stab incisions inferiorly.  The soft tissues anterior to the aorta were reapproximated loosely. The sternum is closed with double strength sternal wire. The soft tissues anterior to the sternum were closed in multiple layers and the skin is closed with a running subcuticular skin closure.  The post-bypass portion of the operation was notable for stable rhythm and hemodynamics.  No blood products were administered during the operation.  The patient tolerated the procedure well and is transported to the surgical intensive care in stable condition. There are no  intraoperative complications. All sponge instrument  and needle counts are verified correct at completion of the operation.    Salvatore Decent. Cornelius Moras MD 01/24/2012 8:01 PM

## 2012-01-24 NOTE — Anesthesia Preprocedure Evaluation (Addendum)
Anesthesia Evaluation  Patient identified by MRN, date of birth, ID band Patient awake    Reviewed: Allergy & Precautions, H&P , NPO status , Patient's Chart, lab work & pertinent test results, reviewed documented beta blocker date and time   Airway Mallampati: III TM Distance: >3 FB Neck ROM: Full    Dental No notable dental hx. (+) Teeth Intact and Dental Advisory Given   Pulmonary asthma , sleep apnea ,  breath sounds clear to auscultation  Pulmonary exam normal       Cardiovascular + CAD Rhythm:Regular Rate:Normal     Neuro/Psych negative neurological ROS  negative psych ROS   GI/Hepatic Neg liver ROS, GERD-  Medicated,  Endo/Other  negative endocrine ROSMorbid obesity  Renal/GU negative Renal ROS  negative genitourinary   Musculoskeletal   Abdominal (+) + obese,   Peds  Hematology negative hematology ROS (+)   Anesthesia Other Findings   Reproductive/Obstetrics negative OB ROS                          Anesthesia Physical Anesthesia Plan  ASA: IV and Emergent  Anesthesia Plan: General   Post-op Pain Management:    Induction: Intravenous  Airway Management Planned: Oral ETT  Additional Equipment: Arterial line, CVP, PA Cath and Ultrasound Guidance Line Placement  Intra-op Plan:   Post-operative Plan: Post-operative intubation/ventilation  Informed Consent: I have reviewed the patients History and Physical, chart, labs and discussed the procedure including the risks, benefits and alternatives for the proposed anesthesia with the patient or authorized representative who has indicated his/her understanding and acceptance.   Dental advisory given  Plan Discussed with: CRNA, Anesthesiologist and Surgeon  Anesthesia Plan Comments:        Anesthesia Quick Evaluation

## 2012-01-24 NOTE — Consult Note (Signed)
CARDIOTHORACIC SURGERY CONSULTATION REPORT  PCP is Jacklynn Barnacle, NP, NP Referring Provider is Lesleigh Noe, MD   Reason for consultation:  Critical left main disease  HPI:  Patient is an obese 61 year old African American female with no previous history of coronary artery disease who was admitted to the hospital with an acute myocardial infarction. The patient has a presumed history of hypertension and hyperlipidemia. She describes a one month history of exertional chest pain described as central pressure and tightness in her chest radiating to her neck. She has attributed these symptoms to asthma and acid reflux disease. She was seen in an outpatient primary care clinic 2 days ago with similar symptoms, which were attributed to asthma. Yesterday she developed a more severe and persistent episode of chest tightness associated with nausea vomiting and diaphoresis. The pain persisted and became quite severe ultimately prompting her to present to the emergency department. She was admitted to the hospital with some EKG changes and positive cardiac enzymes. Chest pain ultimately was brought under control with intravenous nitroglycerin. She was brought to the cath lab this afternoon where she underwent cardiac catheterization demonstrating critical left main disease with subtotal occlusion of left anterior descending coronary artery. Emergent cardiac surgical consultation was requested.  Past Medical History  Diagnosis Date  . Asthma   . Arthritis   . Sleep apnea     uses cpap  . Breast cancer   . GERD (gastroesophageal reflux disease)   . Hyperlipidemia     Past Surgical History  Procedure Date  . Breast lumpectomy 1997    breast cancer  . Tubal ligation   . Abdominal hysterectomy   . Tonsillectomy     Family History  Problem Relation Age of Onset  . Cancer Mother     Colon  . Cancer Brother     Lung    Social History History  Substance Use Topics  . Smoking  status: Never Smoker   . Smokeless tobacco: Never Used  . Alcohol Use: No    Prior to Admission medications   Medication Sig Start Date End Date Taking? Authorizing Provider  Albuterol Sulfate (PROAIR HFA IN) Inhale into the lungs. Emergency inhaler for Asthma   Yes Historical Provider, MD  Betaxolol HCl (BETOPTIC-S OP) Apply 1 drop to eye 2 (two) times daily.    Yes Historical Provider, MD  Bimatoprost (LUMIGAN OP) Apply 1 drop to eye every evening.     Yes Historical Provider, MD  Brimonidine Tartrate-Timolol (COMBIGAN OP) Apply 1 drop to eye 2 (two) times daily. 1 drop both eyes am & pm   Yes Historical Provider, MD  dorzolamide (TRUSOPT) 2 % ophthalmic solution Place 1 drop into both eyes 3 (three) times daily.   Yes Historical Provider, MD  levalbuterol Pauline Aus) 0.63 MG/3ML nebulizer solution Take 1 ampule by nebulization every 6 (six) hours as needed. Wheezing.   Yes Historical Provider, MD  loratadine (CLARITIN) 10 MG tablet Take 10 mg by mouth daily.   Yes Historical Provider, MD  mometasone (NASONEX) 50 MCG/ACT nasal spray Place 2 sprays into the nose 2 (two) times daily.   Yes Historical Provider, MD  omeprazole (PRILOSEC) 20 MG capsule Take 20 mg by mouth daily.   Yes Historical Provider, MD  sodium chloride (OCEAN) 0.65 % nasal spray Place 1 spray into the nose as needed. Congestion.   Yes Historical Provider, MD    Current Facility-Administered Medications  Medication Dose Route Frequency  Provider Last Rate Last Dose  . 0.9 %  sodium chloride infusion  250 mL Intravenous PRN Lyn Records III, MD      . 0.9 %  sodium chloride infusion  1 mL/kg/hr Intravenous Continuous Lyn Records III, MD      . acetaminophen (TYLENOL) tablet 650 mg  650 mg Oral Q4H PRN Zacarias Pontes, MD   650 mg at 01/24/12 0235  . albuterol (PROVENTIL HFA;VENTOLIN HFA) 108 (90 BASE) MCG/ACT inhaler 1 puff  1 puff Inhalation Q4H PRN Zacarias Pontes, MD      . albuterol (PROVENTIL) (5 MG/ML) 0.5% nebulizer  solution 2.5 mg  2.5 mg Nebulization Q6H PRN Zacarias Pontes, MD      . aspirin chewable tablet 324 mg  324 mg Oral Once Dayton Bailiff, MD   324 mg at 01/23/12 2001  . aspirin chewable tablet 324 mg  324 mg Oral NOW Zacarias Pontes, MD       Or  . aspirin suppository 300 mg  300 mg Rectal NOW Zacarias Pontes, MD      . aspirin chewable tablet 324 mg  324 mg Oral Pre-Cath Lyn Records III, MD      . aspirin EC tablet 81 mg  81 mg Oral Daily Zacarias Pontes, MD   81 mg at 01/24/12 0959  . atorvastatin (LIPITOR) tablet 20 mg  20 mg Oral q1800 Zacarias Pontes, MD      . betaxolol (BETOPTIC-S) 0.25 % ophthalmic suspension 1 drop  1 drop Both Eyes BID Zacarias Pontes, MD      . bimatoprost (LUMIGAN) 0.01 % ophthalmic solution 1 drop  1 drop Both Eyes QHS Zacarias Pontes, MD      . brimonidine (ALPHAGAN) 0.2 % ophthalmic solution 1 drop  1 drop Both Eyes Q12H Kathlen Brunswick, MD       And  . timolol (TIMOPTIC) 0.5 % ophthalmic solution 1 drop  1 drop Both Eyes BID Kathlen Brunswick, MD      . cefUROXime (ZINACEF) 1.5 g in dextrose 5 % 50 mL IVPB  1.5 g Intravenous To OR Lyn Records III, MD      . cefUROXime (ZINACEF) 750 mg in dextrose 5 % 50 mL IVPB  750 mg Intravenous To OR Lyn Records III, MD      . dexmedetomidine (PRECEDEX) 400 mcg in sodium chloride 0.9 % 100 mL infusion  0.1-0.7 mcg/kg/hr Intravenous To OR Lyn Records III, MD      . diazepam (VALIUM) tablet 5 mg  5 mg Oral On Call Lyn Records III, MD   5 mg at 01/24/12 1341  . diphenhydrAMINE (BENADRYL) capsule 25 mg  25 mg Oral Once Zacarias Pontes, MD   25 mg at 01/24/12 0010  . DOPamine (INTROPIN) 800 mg in dextrose 5 % 250 mL infusion  2-20 mcg/kg/min Intravenous To OR Lyn Records III, MD      . dorzolamide (TRUSOPT) 2 % ophthalmic solution 1 drop  1 drop Both Eyes TID Zacarias Pontes, MD      . EPINEPHrine (ADRENALIN) 4,000 mcg in dextrose 5 % 250 mL infusion  0.5-20 mcg/min Intravenous To OR Lyn Records III, MD      . fentaNYL (SUBLIMAZE) 0.05 MG/ML  injection           . fluticasone (FLONASE) 50 MCG/ACT nasal spray 1 spray  1 spray Each Nare Daily Zacarias Pontes, MD   1 spray at 01/24/12 0901  .  heparin 2-0.9 UNIT/ML-% infusion           . heparin ADULT infusion 100 units/mL (25000 units/250 mL)  1,000 Units/hr Intravenous Continuous Dayton Bailiff, MD 10 mL/hr at 01/24/12 0800 1,000 Units/hr at 01/24/12 0800  . heparin bolus via infusion 4,000 Units  4,000 Units Intravenous Once Dayton Bailiff, MD   4,000 Units at 01/23/12 1959  . insulin regular (NOVOLIN R,HUMULIN R) 1 Units/mL in sodium chloride 0.9 % 100 mL infusion   Intravenous To OR Lyn Records III, MD      . labetalol (NORMODYNE,TRANDATE) injection 20 mg  20 mg Intravenous Once Dayton Bailiff, MD   20 mg at 01/23/12 1952  . lidocaine (XYLOCAINE) 1 % injection           . loratadine (CLARITIN) tablet 10 mg  10 mg Oral Daily Zacarias Pontes, MD   10 mg at 01/24/12 0901  . magnesium sulfate (IV Push/IM) injection 40 mEq  40 mEq Other To OR Lyn Records III, MD      . metoprolol tartrate (LOPRESSOR) tablet 25 mg  25 mg Oral BID Zacarias Pontes, MD   25 mg at 01/24/12 0901  . midazolam (VERSED) 2 MG/2ML injection           . nitroGLYCERIN (NITROSTAT) SL tablet 0.4 mg  0.4 mg Sublingual Q5 min PRN Dayton Bailiff, MD      . nitroGLYCERIN (NTG ON-CALL) 0.2 mg/mL injection           . nitroGLYCERIN 0.2 mg/mL in dextrose 5 % infusion  2-200 mcg/min Intravenous Titrated Zacarias Pontes, MD   15 mcg/min at 01/24/12 0800  . nitroGLYCERIN 0.2 mg/mL in dextrose 5 % infusion  2-200 mcg/min Intravenous To OR Lyn Records III, MD      . nitroglycerin-nicardipine-HEPARIN-sodium bicarbonate irrigation for artery spasm   Irrigation To OR Lyn Records III, MD      . ondansetron [facility]) injection 4 mg  4 mg Intravenous Q6H PRN Zacarias Pontes, MD      . pantoprazole (PROTONIX) EC tablet 40 mg  40 mg Oral Q1200 Zacarias Pontes, MD   40 mg at 01/24/12 1158  . phenylephrine (NEO-SYNEPHRINE) 20,000 mcg in dextrose 5 % 250 mL infusion   30-200 mcg/min Intravenous To OR Lyn Records III, MD      . potassium chloride injection 80 mEq  80 mEq Other To OR Lyn Records III, MD      . sodium chloride (OCEAN) 0.65 % nasal spray 1 spray  1 spray Each Nare PRN Zacarias Pontes, MD      . sodium chloride 0.9 % injection 3 mL  3 mL Intravenous Q12H Lyn Records III, MD      . sodium chloride 0.9 % injection 3 mL  3 mL Intravenous PRN Lyn Records III, MD      . tranexamic acid (CYKLOKAPRON) bolus via infusion - over 30 minutes 1,302 mg  15 mg/kg Intravenous To OR Lyn Records III, MD      . tranexamic acid (CYKLOKAPRON) pump prime solution 174 mg  2 mg/kg Intracatheter To OR Lyn Records III, MD      . tranexamic acid (CYKLOLAPRON) 2,500 mg in sodium chloride 0.9 % 250 mL infusion  1.5 mg/kg/hr Intravenous To OR Lyn Records III, MD      . vancomycin (VANCOCIN) 1,500 mg in sodium chloride 0.9 % 250 mL IVPB  1,500 mg Intravenous To OR Lyn Records  III, MD      . DISCONTD: brimonidine-timolol (COMBIGAN) 0.2-0.5 % ophthalmic solution 1 drop  1 drop Both Eyes Q12H Zacarias Pontes, MD      . DISCONTD: metoprolol (LOPRESSOR) 1 MG/ML injection           . DISCONTD: metoprolol (LOPRESSOR) injection 5 mg  5 mg Intravenous Once Dayton Bailiff, MD      . DISCONTD: nitroGLYCERIN (NITROSTAT) SL tablet 0.4 mg  0.4 mg Sublingual Q5 Min x 3 PRN Zacarias Pontes, MD        Allergies  Allergen Reactions  . Codeine   . Latex     REACTION: hives  . Sulfonamide Derivatives     REACTION: gi upset    Review of Systems:  General:  normal appetite, normal energy   Respiratory:  no cough, no wheezing, no hemoptysis, no pain with inspiration or cough, no shortness of breath   Cardiac:  + chest pain or tightness, + exertional SOB, no resting SOB, no PND, no orthopnea, no LE edema, no palpitations, no syncope  GI:   +occasional difficulty swallowing, no hematochezia, no hematemesis, no melena, no constipation, no diarrhea   GU:   no dysuria, no urgency, no  frequency   Musculoskeletal: no arthritis, no arthralgia   Vascular:  no pain suggestive of claudication   Neuro:   no symptoms suggestive of TIA's, no seizures, no headaches, no peripheral neuropathy   Endocrine:  Negative   HEENT:  no loose teeth or painful teeth,  no recent vision changes  Psych:   + anxiety and stress related to brother's recent passing, no depression    Physical Exam:   BP 131/97  Pulse 90  Temp(Src) 98.1 F (36.7 C) (Oral)  Resp 21  Ht 5\' 2"  (1.575 m)  Wt 86.8 kg (191 lb 5.8 oz)  BMI 35.00 kg/m2  SpO2 97%  General:  Obese but otherwise well-appearing  HEENT:  Unremarkable   Neck:   no JVD, no bruits, no adenopathy   Chest:   clear to auscultation, symmetrical breath sounds, no wheezes, no rhonchi   CV:   RRR, no murmur   Abdomen:  soft, non-tender, no masses   Extremities:  warm, well-perfused, pulses diminished, sheath in right groin  Rectal/GU  Deferred  Neuro:   Grossly non-focal and symmetrical throughout  Skin:   Clean and dry, no rashes, no breakdown  Diagnostic Tests:  Diagnostic Cardiac Catheterization Report  Carolyn Fleming  61 y.o.  female  November 23, 1950  Procedure Date: 01/24/2012  Referring Physician: Patsi Sears at [facility]  Primary Cardiologist:: Wayne Both, III, MD  PROCEDURE: Left heart catheterization with selective coronary angiography, left ventriculogram.  INDICATIONS: Acute coronary syndrome with prolonged chest pain after finding that her brother died suddenly 36 hours ago. Cardiac markers are positive and the patient's EKG demonstrates anterior T-wave inversion with poor R wave progression. She has been pain-free since IV nitroglycerin was started on admission.  The risks, benefits, and details of the procedure were explained to the patient. The patient verbalized understanding and wanted to proceed. Informed written consent was obtained.  PROCEDURE TECHNIQUE: After Xylocaine anesthesia a 5 sheath was placed in the right femoral  artery with a single anterior needle wall stick. Coronary angiography was done using a 5 [place] A2 MP catheter. Left ventriculography was done using a 5 [place] A2 MP catheter.  CONTRAST: Total of 80 cc.  COMPLICATIONS: None.  HEMODYNAMICS: Aortic pressure was 156/86 mmHg; LV pressure was 157/10  mmHg; LVEDP 16 mm mercury. There was no gradient between the left ventricle and aorta.  ANGIOGRAPHIC DATA: The left main coronary artery is greater than 90% obstructed and appears hazy within the distal segment..  The left anterior descending artery is segmental 80-90% stenosis starting of the left main. 99% stenosis with TIMI grade 1-2 flow beyond the obstruction.  The left circumflex artery is 60-70% stenosed in the mid segment prior to the origin of a bifurcating distal circumflex territory. A small first obtuse marginal arises proximal to the stenosis..  The right coronary artery is codominant with no significant obstruction.  LEFT VENTRICULOGRAM: Left ventricular angiogram was done in the 30 RAO projection and revealed abnormal left ventricular wall motion involving the anteroapical region. The systolic function overall is low normal with an estimated ejection fraction of 45-50 %.  IMPRESSIONS: 1. Severely distal left main coronary artery disease with greater than 90% obstruction and an appearance that suggests the presence of thrombus.  2. Segmental 80% ostial/proximal LAD and 99% obstruction in the distal vessel well before the apex. There is TIMI grade 1-2 flow through the mid LAD obstruction.  3. Moderate mid circumflex disease, 50-70%, before the origin of a large second and third marginal branch.  4. Distal anterior wall and apical severe hypokinesis. EF 50%  RECOMMENDATION: Given the patient's presentation and the appearance of the left main, have spoken with Dr. Cornelius Moras, to encourage expeditious coronary artery bypass grafting, perhaps today..     Impression:  Critical left main disease with 95%  ulcerated obstruction in the mid left anterior ascending coronary artery and subtotal occlusion of the mid left anterior descending coronary artery. There is severe akinesis or hypokinesis of the distal anterior wall and apex of the left ventricle consistent with recent acute myocardial infarction. Overall ejection fraction is only mildly reduced at 50%. I feel that it would be best to proceed directly to the operating room for emergency surgical revascularization.  Plan:  I have outlined at length the indications, risks, and potential benefits of coronary artery bypass grafting with the patient here in the cath lab holding area. Alternative treatment strategies been discussed. The rationale for proceeding with emergency surgical revascularization as been reviewed.  The patient understands and accepts all potential associated risks of surgery including but not limited to risk of death, stroke, myocardial infarction, congestive heart failure, respiratory failure, renal failure, bleeding requiring blood transfusion and/or reexploration, arrhythmia, heart block or bradycardia requiring permanent pacemaker, pneumonia, pleural effusion, wound infection, pulmonary embolus or other thromboembolic complication, chronic pain or other delayed complications, or late recurrence of symptomatic coronary artery disease and/or congestive heart failure.  All questions answered.     Salvatore Decent. Cornelius Moras, MD 01/24/2012 3:38 PM

## 2012-01-24 NOTE — Progress Notes (Signed)
ANTICOAGULATION CONSULT NOTE - Follow Up Consult  Pharmacy Consult for heparin Indication: NSTEMI  Labs:  Basename 01/24/12 0250 01/23/12 2253 01/23/12 1850  HGB 14.3 -- 15.4*  HCT 41.7 -- 44.5  PLT 254 -- 292  APTT -- -- --  LABPROT -- -- 13.0  INR -- -- 0.96  HEPARINUNFRC 0.65 -- --  CREATININE -- -- 1.00  CKTOTAL -- 1123* --  CKMB -- 151.9* --  TROPONINI -- >25.00* --    Assessment/Plan: 61yo female therapeutic on heparin with initial dosing for NSTEMI.  Will continue gtt at current rate and confirm stable with additional level.  Colleen Can PharmD BCPS 01/24/2012,4:02 AM

## 2012-01-24 NOTE — Anesthesia Procedure Notes (Signed)
Procedure Name: Intubation Date/Time: 01/24/2012 4:15 PM Performed by: Glendora Score A Pre-anesthesia Checklist: Patient identified, Emergency Drugs available, Suction available and Patient being monitored Patient Re-evaluated:Patient Re-evaluated prior to inductionOxygen Delivery Method: Circle system utilized Preoxygenation: Pre-oxygenation with 100% oxygen Intubation Type: IV induction Ventilation: Mask ventilation without difficulty Laryngoscope Size: Miller and 2 Grade View: Grade I Tube type: Oral Tube size: 8.0 mm Number of attempts: 1 Airway Equipment and Method: Stylet Placement Confirmation: ETT inserted through vocal cords under direct vision,  positive ETCO2 and breath sounds checked- equal and bilateral Secured at: 21 cm Tube secured with: Tape Dental Injury: Teeth and Oropharynx as per pre-operative assessment

## 2012-01-24 NOTE — Progress Notes (Signed)
TCTS BRIEF SICU PROGRESS NOTE  Day of Surgery  S/P Procedure(s) (LRB): CORONARY ARTERY BYPASS GRAFTING (CABG) (N/A)   Sedated on vent Sinus rhythm, AAI paced Stable BP, PA pressures low, C.I. 1.6 Minimal chest tube output Excellent UOP Labs okay  Plan: Continue routine early postop  Carolyn Fleming H 01/24/2012 10:19 PM

## 2012-01-24 NOTE — Preoperative (Addendum)
Beta Blockers   Reason not to administer Beta Blockers:Not Applicable 

## 2012-01-24 NOTE — Progress Notes (Signed)
Right groin arterial sheath removed as per protocol. Direct pressure held, no bleeding noted, normal sheath pull without complication.  RN to monitor closely as per protocol.

## 2012-01-24 NOTE — H&P (Addendum)
  She was admitted with prolonged chest pain that occurred as she was preparing to travel to [place] for her brothers funeral. She was trying to make arrangement when the discomfort started.She is current;y pain free but has abnormal markers. Stress cardiomopathy vs CAD is the differential. The patient needs to undergo coronary angiography. We have discussed the procedure including its risks of stroke, bleeding, death, surgery, kidney impairment, ischemic limb, among other risks. The patient is willing to proceed.

## 2012-01-24 NOTE — Transfer of Care (Signed)
Immediate Anesthesia Transfer of Care Note  Patient: Carolyn Fleming  Procedure(s) Performed: Procedure(s) (LRB): CORONARY ARTERY BYPASS GRAFTING (CABG) (N/A)  Patient Location: SICU  Anesthesia Type: General  Level of Consciousness: Patient remains intubated per anesthesia plan  Airway & Oxygen Therapy: Patient remains intubated per anesthesia plan and Patient placed on Ventilator (see vital sign flow sheet for setting)  Post-op Assessment: Post -op Vital signs reviewed and stable  Post vital signs: Reviewed and stable  Complications: No apparent anesthesia complications

## 2012-01-24 NOTE — Anesthesia Postprocedure Evaluation (Signed)
  Anesthesia Post-op Note  Patient: Carolyn Fleming  Procedure(s) Performed: Procedure(s) (LRB): CORONARY ARTERY BYPASS GRAFTING (CABG) (N/A)  Patient Location: SICU  Anesthesia Type: General  Level of Consciousness: sedated and Patient remains intubated per anesthesia plan  Airway and Oxygen Therapy: Patient remains intubated per anesthesia plan and Patient placed on Ventilator (see vital sign flow sheet for setting)  Post-op Pain: none  Post-op Assessment: Post-op Vital signs reviewed  Post-op Vital Signs: Reviewed  Complications: No apparent anesthesia complications

## 2012-01-25 ENCOUNTER — Inpatient Hospital Stay (HOSPITAL_COMMUNITY): Payer: PRIVATE HEALTH INSURANCE

## 2012-01-25 ENCOUNTER — Encounter (HOSPITAL_COMMUNITY): Payer: Self-pay | Admitting: Thoracic Surgery (Cardiothoracic Vascular Surgery)

## 2012-01-25 DIAGNOSIS — I10 Essential (primary) hypertension: Secondary | ICD-10-CM

## 2012-01-25 DIAGNOSIS — E669 Obesity, unspecified: Secondary | ICD-10-CM

## 2012-01-25 HISTORY — DX: Essential (primary) hypertension: I10

## 2012-01-25 HISTORY — DX: Obesity, unspecified: E66.9

## 2012-01-25 LAB — GLUCOSE, CAPILLARY
Glucose-Capillary: 114 mg/dL — ABNORMAL HIGH (ref 70–99)
Glucose-Capillary: 114 mg/dL — ABNORMAL HIGH (ref 70–99)
Glucose-Capillary: 101 mg/dL — ABNORMAL HIGH (ref 70–99)
Glucose-Capillary: 130 mg/dL — ABNORMAL HIGH (ref 70–99)

## 2012-01-25 LAB — POCT I-STAT 3, ART BLOOD GAS (G3+)
Bicarbonate: 21.8 mEq/L (ref 20.0–24.0)
O2 Saturation: 97 %
Patient temperature: 36.9
TCO2: 23 mmol/L (ref 0–100)
pCO2 arterial: 31.4 mmHg — ABNORMAL LOW (ref 35.0–45.0)
pCO2 arterial: 40 mmHg (ref 35.0–45.0)
pCO2 arterial: 39.7 mmHg (ref 35.0–45.0)
pH, Arterial: 7.407 — ABNORMAL HIGH (ref 7.350–7.400)
pH, Arterial: 7.326 — ABNORMAL LOW (ref 7.350–7.400)

## 2012-01-25 LAB — HEMOGLOBIN A1C
Hgb A1c MFr Bld: 6.4 % — ABNORMAL HIGH (ref <5.7)
Mean Plasma Glucose: 137 mg/dL — ABNORMAL HIGH (ref <117)

## 2012-01-25 LAB — CBC
MCV: 82.6 fL (ref 78.0–100.0)
Platelets: 140 10*3/uL — ABNORMAL LOW (ref 150–400)
RDW: 14 % (ref 11.5–15.5)
WBC: 8.5 10*3/uL (ref 4.0–10.5)

## 2012-01-25 LAB — BASIC METABOLIC PANEL
Calcium: 8.3 mg/dL — ABNORMAL LOW (ref 8.4–10.5)
Chloride: 105 mEq/L (ref 96–112)
Creatinine, Ser: 1 mg/dL (ref 0.50–1.10)
GFR calc Af Amer: 70 mL/min — ABNORMAL LOW (ref >90)

## 2012-01-25 MED ORDER — SODIUM CHLORIDE 0.9 % IJ SOLN
3.0000 mL | Freq: Two times a day (BID) | INTRAMUSCULAR | Status: DC
  Administered 2012-01-25 – 2012-01-28 (×8): 3 mL via INTRAVENOUS

## 2012-01-25 MED ORDER — SODIUM CHLORIDE 0.9 % IJ SOLN: 3.0000 mL | INTRAMUSCULAR | Status: DC | PRN

## 2012-01-25 MED ORDER — LISINOPRIL 5 MG PO TABS
5.0000 mg | ORAL_TABLET | Freq: Every day | ORAL | Status: DC
  Administered 2012-01-26 – 2012-01-29 (×3): 5 mg via ORAL
  Filled 2012-01-25 (×4): qty 1

## 2012-01-25 MED ORDER — POTASSIUM CHLORIDE CRYS ER 20 MEQ PO TBCR
20.0000 meq | EXTENDED_RELEASE_TABLET | Freq: Every day | ORAL | Status: DC
  Administered 2012-01-26 – 2012-01-29 (×4): 20 meq via ORAL
  Filled 2012-01-25 (×4): qty 1

## 2012-01-25 MED ORDER — METOPROLOL TARTRATE 25 MG PO TABS
25.0000 mg | ORAL_TABLET | Freq: Two times a day (BID) | ORAL | Status: DC
Start: 2012-01-25 — End: 2012-01-29
  Administered 2012-01-25 – 2012-01-29 (×9): 25 mg via ORAL
  Filled 2012-01-25 (×11): qty 1

## 2012-01-25 MED ORDER — LEVALBUTEROL HCL 0.63 MG/3ML IN NEBU
0.6300 mg | INHALATION_SOLUTION | Freq: Three times a day (TID) | RESPIRATORY_TRACT | Status: DC
  Administered 2012-01-25 (×2): 0.63 mg via RESPIRATORY_TRACT
  Filled 2012-01-25 (×5): qty 3

## 2012-01-25 MED ORDER — INSULIN ASPART 100 UNIT/ML ~~LOC~~ SOLN
0.0000 [IU] | Freq: Three times a day (TID) | SUBCUTANEOUS | Status: DC
  Administered 2012-01-25 – 2012-01-27 (×4): 2 [IU] via SUBCUTANEOUS

## 2012-01-25 MED ORDER — MOVING RIGHT ALONG BOOK
Freq: Once | Status: AC
  Administered 2012-01-25: 08:00:00
  Filled 2012-01-25: qty 1

## 2012-01-25 MED ORDER — MIDAZOLAM HCL 2 MG/2ML IJ SOLN
2.0000 mg | Freq: Once | INTRAMUSCULAR | Status: AC
  Administered 2012-01-25: 2 mg via INTRAVENOUS
  Filled 2012-01-25: qty 2

## 2012-01-25 MED ORDER — TRAMADOL HCL 50 MG PO TABS: 50.0000 mg | ORAL_TABLET | ORAL | Status: DC | PRN

## 2012-01-25 MED ORDER — SODIUM CHLORIDE 0.9 % IV SOLN: 250.0000 mL | INTRAVENOUS | Status: DC | PRN

## 2012-01-25 MED ORDER — FUROSEMIDE 40 MG PO TABS
40.0000 mg | ORAL_TABLET | Freq: Every day | ORAL | Status: DC
  Administered 2012-01-26 – 2012-01-29 (×4): 40 mg via ORAL
  Filled 2012-01-25 (×4): qty 1

## 2012-01-25 MED ORDER — FUROSEMIDE 10 MG/ML IJ SOLN
20.0000 mg | Freq: Two times a day (BID) | INTRAMUSCULAR | Status: AC
  Administered 2012-01-25 (×2): 20 mg via INTRAVENOUS
  Filled 2012-01-25 (×2): qty 2

## 2012-01-25 MED ORDER — LEVALBUTEROL HCL 0.63 MG/3ML IN NEBU
0.6300 mg | INHALATION_SOLUTION | Freq: Four times a day (QID) | RESPIRATORY_TRACT | Status: DC | PRN
  Filled 2012-01-25: qty 3

## 2012-01-25 MED ORDER — POTASSIUM CHLORIDE 10 MEQ/50ML IV SOLN
10.0000 meq | INTRAVENOUS | Status: AC
  Administered 2012-01-25 (×2): 10 meq via INTRAVENOUS
  Filled 2012-01-25: qty 100

## 2012-01-25 NOTE — Progress Notes (Signed)
TCTS BRIEF PROGRESS NOTE  1 Day Post-Op  S/P Procedure(s) (LRB): CORONARY ARTERY BYPASS GRAFTING (CABG) (N/A)   Doing remarkably well following transfer to floor Ambulating in hall  Plan: Continue routine care  Niaya Hickok H 01/25/2012 3:18 PM

## 2012-01-25 NOTE — Progress Notes (Signed)
The patient is awake and alert following surgery. She is extubated. No arrhythmias. No murmurs heard. She is neurologically intact. Will continue to follow along with the surgical team.

## 2012-01-25 NOTE — Progress Notes (Signed)
Report called to Elon Alas and met RN at bedside upon transfer.  Patient hooked to telemetry monitor with no complications.

## 2012-01-25 NOTE — Progress Notes (Signed)
   CARDIOTHORACIC SURGERY PROGRESS NOTE   R1 Day Post-Op Procedure(s) (LRB): CORONARY ARTERY BYPASS GRAFTING (CABG) (N/A)  Subjective: Doing well.  Mild soreness.  Feels much better than prior to surgery.  Objective: Vital signs: BP Readings from Last 1 Encounters:  01/25/12 109/69   Pulse Readings from Last 1 Encounters:  01/25/12 82   Resp Readings from Last 1 Encounters:  01/25/12 18   Temp Readings from Last 1 Encounters:  01/25/12 99.1 F (37.3 C) Core (Comment)    Hemodynamics: PAP: (20-35)/(10-22) 31/15 mmHg CO:  [2.6 L/min-4.9 L/min] 3.4 L/min CI:  [1.4 L/min/m2-2.6 L/min/m2] 1.8 L/min/m2  Physical Exam:  Rhythm:   sinus  Breath sounds: clear  Heart sounds:  RRR  Incisions:  Dressings dry  Abdomen:  soft  Extremities:  warm   Intake/Output from previous day: 06/10 0701 - 06/11 0700 In: 4555.7 [I.V.:3155.7; Blood:300; IV Piggyback:1100] Out: 4295 [Urine:2585; Blood:1500; Chest Tube:210] Intake/Output this shift:    Lab Results:  Basename 01/25/12 0400 01/24/12 2038 01/24/12 2025  WBC 8.5 -- 13.4*  HGB 10.0* 11.9* --  HCT 30.0* 35.0* --  PLT 140* -- 177   BMET:  Basename 01/25/12 0400 01/24/12 2038 01/24/12 1451  NA 137 138 --  K 4.0 4.0 --  CL 105 -- 102  CO2 23 -- 24  GLUCOSE 127* 124* --  BUN 16 -- 18  CREATININE 1.00 -- 1.16*  CALCIUM 8.3* -- 8.4    CBG (last 3)   Basename 01/25/12 0657 01/25/12 0605 01/25/12 0511  GLUCAP 97 101* 129*   ABG    Component Value Date/Time   PHART 7.347* 01/25/2012 0411   HCO3 21.8 01/25/2012 0411   TCO2 23 01/25/2012 0411   ACIDBASEDEF 4.0* 01/25/2012 0411   O2SAT 97.0 01/25/2012 0411   CXR: clear  Assessment/Plan: S/P Procedure(s) (LRB): CORONARY ARTERY BYPASS GRAFTING (CABG) (N/A)  Doing well POD1 emergency CABG Expected post op acute blood loss anemia, mild, stable Expected post op volume excess, mild Preop acute MI Morbid obesity   Mobilize  D/C tubes and lines  Transfer step  down  Routine postop   Bernis Stecher H 01/25/2012 7:45 AM

## 2012-01-25 NOTE — Plan of Care (Signed)
Problem: Phase III Progression Outcomes Goal: Transfer to PCTU/Telemetry POD Outcome: Completed/Met Date Met:  01/25/12 POD 1

## 2012-01-25 NOTE — Progress Notes (Signed)
CARDIAC REHAB PHASE I   PRE:  Rate/Rhythm: 80 SR  BP:  Supine: 118/73  Sitting:   Standing:    SaO2: 94 2L 82 RA 92 2L  MODE:  Ambulation: 350 ft   POST:  Rate/Rhythem: 85 SR  BP:  Supine:   Sitting: 120/69  Standing:    SaO2: 100 3L 1445-1530  Assisted x 1 used walker and O2 3L to ambulate. Gait steady with walker. VS stable. Walked 350 feet only c/o was of some incisional pain. Pt to recliner after walk with call light in reach. O2 discontinued prior to walk RA sat went down to 82%.  Carolyn Fleming

## 2012-01-25 NOTE — Procedures (Signed)
Extubation Procedure Note  Patient Details:   Name: Carolyn Fleming DOB: 1951/05/04 MRN: [id number]   Airway Documentation:     Evaluation  O2 sats: stable throughout Complications: No apparent complications Patient did tolerate procedure well. Bilateral Breath Sounds: Rhonchi Suctioning: Airway Yes  Filbert Schilder 01/25/2012, 2:32 AM  Patient was weaned, performed SBT coach on deep breathing, cough and was extubated to a 4 liter nasal cannula.  No evidence of stridor present.  VC 0.8 liters  NIF -36

## 2012-01-26 ENCOUNTER — Inpatient Hospital Stay (HOSPITAL_COMMUNITY): Payer: PRIVATE HEALTH INSURANCE

## 2012-01-26 LAB — BASIC METABOLIC PANEL
BUN: 17 mg/dL (ref 6–23)
CO2: 25 mEq/L (ref 19–32)
Calcium: 8.5 mg/dL (ref 8.4–10.5)
Chloride: 104 mEq/L (ref 96–112)
Creatinine, Ser: 1.23 mg/dL — ABNORMAL HIGH (ref 0.50–1.10)
GFR calc Af Amer: 54 mL/min — ABNORMAL LOW (ref >90)
GFR calc non Af Amer: 47 mL/min — ABNORMAL LOW (ref >90)
Glucose, Bld: 131 mg/dL — ABNORMAL HIGH (ref 70–99)
Potassium: 3.9 mEq/L (ref 3.5–5.1)
Sodium: 139 mEq/L (ref 135–145)

## 2012-01-26 LAB — CBC
HCT: 32.7 % — ABNORMAL LOW (ref 36.0–46.0)
Hemoglobin: 10.8 g/dL — ABNORMAL LOW (ref 12.0–15.0)
MCH: 28.1 pg (ref 26.0–34.0)
MCHC: 33 g/dL (ref 30.0–36.0)
MCV: 84.9 fL (ref 78.0–100.0)
Platelets: 173 10*3/uL (ref 150–400)
RBC: 3.85 MIL/uL — ABNORMAL LOW (ref 3.87–5.11)
RDW: 14.3 % (ref 11.5–15.5)
WBC: 11.3 10*3/uL — ABNORMAL HIGH (ref 4.0–10.5)

## 2012-01-26 LAB — GLUCOSE, CAPILLARY
Glucose-Capillary: 131 mg/dL — ABNORMAL HIGH (ref 70–99)
Glucose-Capillary: 105 mg/dL — ABNORMAL HIGH (ref 70–99)

## 2012-01-26 MED ORDER — KETOROLAC TROMETHAMINE 30 MG/ML IJ SOLN
30.0000 mg | Freq: Once | INTRAMUSCULAR | Status: AC
  Administered 2012-01-26: 30 mg via INTRAVENOUS
  Filled 2012-01-26: qty 1

## 2012-01-26 MED FILL — Magnesium Sulfate Inj 50%: INTRAMUSCULAR | Qty: 10 | Status: AC

## 2012-01-26 MED FILL — Mannitol IV Soln 20%: INTRAVENOUS | Qty: 500 | Status: AC

## 2012-01-26 MED FILL — Heparin Sodium (Porcine) Inj 1000 Unit/ML: INTRAMUSCULAR | Qty: 10 | Status: AC

## 2012-01-26 MED FILL — Heparin Sodium (Porcine) Inj 1000 Unit/ML: INTRAMUSCULAR | Qty: 30 | Status: AC

## 2012-01-26 MED FILL — Sodium Bicarbonate IV Soln 8.4%: INTRAVENOUS | Qty: 50 | Status: AC

## 2012-01-26 MED FILL — Electrolyte-R (PH 7.4) Solution: INTRAVENOUS | Qty: 4000 | Status: AC

## 2012-01-26 MED FILL — Lidocaine HCl IV Inj 20 MG/ML: INTRAVENOUS | Qty: 5 | Status: AC

## 2012-01-26 MED FILL — Sodium Chloride IV Soln 0.9%: INTRAVENOUS | Qty: 1000 | Status: AC

## 2012-01-26 MED FILL — Sodium Chloride Irrigation Soln 0.9%: Qty: 3000 | Status: AC

## 2012-01-26 MED FILL — Potassium Chloride Inj 2 mEq/ML: INTRAVENOUS | Qty: 40 | Status: AC

## 2012-01-26 NOTE — Progress Notes (Signed)
CARDIAC REHAB PHASE I   PRE:  Rate/Rhythm: 81 SR  BP:  Supine:   Sitting: 98/3  Standing:    SaO2: 90 2L  MODE:  Ambulation: 450 ft   POST:  Rate/Rhythem: 86 SR  BP:  Supine:   Sitting: 113/63  Standing:    SaO2: 93 2L 1128-1225 Assisted X 1 used O2 L and walker to ambulate. Gait steady with walker, VS stable. O2 sat after walk 93 2L. Discussed Outpt. CRP with pt , she agrees to referral to [facility] program. Discussed low carb and heart healthy diet with pt and family.  Carolyn Fleming

## 2012-01-26 NOTE — Progress Notes (Addendum)
2 Days Post-Op Procedure(s) (LRB): CORONARY ARTERY BYPASS GRAFTING (CABG) (N/A)  Subjective: Ms. Switala states she doesn't feel as good today.  She states she feels like she got hit by a "mack truck"  Objective: Vital signs in last 24 hours: Temp:  [97.5 F (36.4 C)-98.9 F (37.2 C)] 98.1 F (36.7 C) (06/12 0436) Pulse Rate:  [76-85] 78  (06/12 0436) Cardiac Rhythm:  [-] Normal sinus rhythm (06/11 1940) Resp:  [4-78] 20  (06/12 0436) BP: (105-125)/(62-76) 105/65 mmHg (06/12 0436) SpO2:  [87 %-98 %] 87 % (06/12 0436) Weight:  [195 lb 5.2 oz (88.6 kg)] 195 lb 5.2 oz (88.6 kg) (06/12 0436)  Hemodynamic parameters for last 24 hours: PAP: (33-35)/(15-16) 33/16 mmHg  Intake/Output from previous day: 06/11 0701 - 06/12 0700 In: 162.7 [I.V.:60.7; IV Piggyback:102] Out: 1980 [Urine:1850; Chest Tube:130]    General appearance: alert, cooperative and no distress Heart: regular rate and rhythm Lungs: clear to auscultation bilaterally Abdomen: soft, non-tender; bowel sounds normal; no masses,  no organomegaly Extremities: edema trace Wound: clean and dry  Lab Results:  Basename 01/26/12 0500 01/25/12 0400  WBC 11.3* 8.5  HGB 10.8* 10.0*  HCT 32.7* 30.0*  PLT 173 140*   BMET:  Basename 01/26/12 0500 01/25/12 0400  NA 139 137  K 3.9 4.0  CL 104 105  CO2 25 23  GLUCOSE 131* 127*  BUN 17 16  CREATININE 1.23* 1.00  CALCIUM 8.5 8.3*    PT/INR:  Basename 01/24/12 2025  LABPROT 17.7*  INR 1.43   ABG    Component Value Date/Time   PHART 7.347* 01/25/2012 0411   HCO3 21.8 01/25/2012 0411   TCO2 23 01/25/2012 0411   ACIDBASEDEF 4.0* 01/25/2012 0411   O2SAT 97.0 01/25/2012 0411   CBG (last 3)   Basename 01/26/12 0611 01/25/12 2120 01/25/12 1600  GLUCAP 131* 94 107*    Assessment/Plan: S/P Procedure(s) (LRB): CORONARY ARTERY BYPASS GRAFTING (CABG) (N/A)  2. CV-NSR rate and pressure well controlled on Metoprolol and Lisinopril 3. Resp- no complaints of dyspnea, will wean  oxygen as tolerated, encouraged IS 4. Renal- creatinine mildly elevated, most likely related to dye from catheterization, will monitor 5. CBGS- well controlled, patients Hgb A1c 6.4 preop 6. Ambulation   LOS: 3 days    BARRETT, Denny Peon 01/26/2012   I have seen and examined the patient and agree with the assessment and plan as outlined.  Savian Mazon H 01/26/2012 8:55 AM

## 2012-01-27 LAB — CBC
HCT: 30.6 % — ABNORMAL LOW (ref 36.0–46.0)
Hemoglobin: 10 g/dL — ABNORMAL LOW (ref 12.0–15.0)
RBC: 3.59 MIL/uL — ABNORMAL LOW (ref 3.87–5.11)
RDW: 14.6 % (ref 11.5–15.5)
WBC: 9.9 10*3/uL (ref 4.0–10.5)

## 2012-01-27 LAB — TYPE AND SCREEN
ABO/RH(D): O POS
Antibody Screen: NEGATIVE
Unit division: 0

## 2012-01-27 LAB — BASIC METABOLIC PANEL
Chloride: 106 mEq/L (ref 96–112)
GFR calc Af Amer: 55 mL/min — ABNORMAL LOW (ref >90)
Potassium: 4.2 mEq/L (ref 3.5–5.1)
Sodium: 139 mEq/L (ref 135–145)

## 2012-01-27 LAB — GLUCOSE, CAPILLARY
Glucose-Capillary: 82 mg/dL (ref 70–99)
Glucose-Capillary: 141 mg/dL — ABNORMAL HIGH (ref 70–99)

## 2012-01-27 NOTE — Progress Notes (Signed)
EPWs pulled per protocol.  No ectopy or other problems noted at this time.  Instructed on need for 1 hr of bedrest.  Will continue to monitor.

## 2012-01-27 NOTE — Progress Notes (Signed)
CARDIAC REHAB PHASE I   PRE:  Rate/Rhythm: 84 SR  BP:  Supine:   Sitting: 112/62  Standing:    SaO2: 95 2L 86 RA 94 2L  MODE:  Ambulation: 890 ft   POST:  Rate/Rhythem: 94 SR  BP:  Supine:   Sitting: 130/66  Standing:    SaO2: 93 2L 0830-0910 On arrival pt on O2 2l, oxygen discontinued sat down to 86%. O2 reapplied at 2L. Assisted X 1 used walker and O2 2l to ambulate. Gait steady  with walker good pace. Pt walked 890 feet only c/o was of pain in leg at incision. Pt back to recliner after walk with call light in reach. Encourages use of IS and two more walks today.  Beatrix Fetters

## 2012-01-27 NOTE — Progress Notes (Signed)
Pt ambulated with Rn 580 ft with RW and 2L O2 n/c. Pt tolerated walk. No complaints os c/p or sob.   Lannie Heaps,Rn,BSn

## 2012-01-27 NOTE — Progress Notes (Addendum)
3 Days Post-Op Procedure(s) (LRB): CORONARY ARTERY BYPASS GRAFTING (CABG) (N/A)  Subjective: Carolyn Fleming states she still feels bad today.  She states that she is unable to get comfortable whether its in the bed or the chair.  She is ambulating without difficulty  Objective: Vital signs in last 24 hours: Temp:  [97.4 F (36.3 C)-98.6 F (37 C)] 97.4 F (36.3 C) (06/13 0700) Pulse Rate:  [78-91] 79  (06/13 0700) Cardiac Rhythm:  [-] Normal sinus rhythm (06/12 2130) Resp:  [18] 18  (06/13 0700) BP: (93-125)/(58-79) 125/79 mmHg (06/13 0700) SpO2:  [91 %-97 %] 97 % (06/13 0700) Weight:  [194 lb 0.1 oz (88 kg)] 194 lb 0.1 oz (88 kg) (06/13 0500)  Intake/Output from previous day: 06/12 0701 - 06/13 0700 In: 363 [P.O.:360; I.V.:3] Out: 200 [Urine:200]  General appearance: alert, cooperative and no distress Heart: regular rate and rhythm Lungs: clear to auscultation bilaterally Abdomen: soft, non-tender; bowel sounds normal; no masses,  no organomegaly Extremities: edema trace Wound: clean and dry  Lab Results:  Basename 01/27/12 0500 01/26/12 0500  WBC 9.9 11.3*  HGB 10.0* 10.8*  HCT 30.6* 32.7*  PLT 157 173   BMET:  Basename 01/27/12 0500 01/26/12 0500  NA 139 139  K 4.2 3.9  CL 106 104  CO2 27 25  GLUCOSE 112* 131*  BUN 21 17  CREATININE 1.21* 1.23*  CALCIUM 8.2* 8.5    PT/INR:  Basename 01/24/12 2025  LABPROT 17.7*  INR 1.43   ABG    Component Value Date/Time   PHART 7.347* 01/25/2012 0411   HCO3 21.8 01/25/2012 0411   TCO2 23 01/25/2012 0411   ACIDBASEDEF 4.0* 01/25/2012 0411   O2SAT 97.0 01/25/2012 0411   CBG (last 3)   Basename 01/27/12 0532 01/26/12 2033 01/26/12 1631  GLUCAP 96 125* 101*    Assessment/Plan: S/P Procedure(s) (LRB): CORONARY ARTERY BYPASS GRAFTING (CABG) (N/A)  2. CV- rate and pressure well controlled, on Lopressor and Lisinopril- will d/c EPW this morning 3. Resp- no dyspnea, will wean oxygen as tolerated, IS 4. Renal- creatinine  remains mildly elevated, but appears to be patients baseline  5. CBGS- well controlled, continue SSIP, preop A1C 6.4 6. Dispo- patient doing well other than being uncomfortable.  Will d/c EPW this morning and plan for d/c home Saturday if patients feels well  LOS: 4 days    BARRETT, ERIN 01/27/2012   I have seen and examined the patient and agree with the assessment and plan as outlined.  Lima Chillemi H 01/27/2012 4:02 PM

## 2012-01-28 LAB — GLUCOSE, CAPILLARY
Glucose-Capillary: 91 mg/dL (ref 70–99)
Glucose-Capillary: 119 mg/dL — ABNORMAL HIGH (ref 70–99)

## 2012-01-28 MED ORDER — POTASSIUM CHLORIDE CRYS ER 20 MEQ PO TBCR: 20.0000 meq | EXTENDED_RELEASE_TABLET | Freq: Every day | ORAL | Status: DC

## 2012-01-28 MED ORDER — METOPROLOL TARTRATE 25 MG PO TABS: 25.0000 mg | ORAL_TABLET | Freq: Two times a day (BID) | ORAL | Status: DC

## 2012-01-28 MED ORDER — OXYCODONE HCL 5 MG PO TABS: 5.0000 mg | ORAL_TABLET | ORAL | Status: AC | PRN

## 2012-01-28 MED ORDER — LISINOPRIL 5 MG PO TABS: 5.0000 mg | ORAL_TABLET | Freq: Every day | ORAL | Status: DC

## 2012-01-28 MED ORDER — ASPIRIN 325 MG PO TBEC: 325.0000 mg | DELAYED_RELEASE_TABLET | Freq: Every day | ORAL | Status: AC

## 2012-01-28 MED ORDER — ATORVASTATIN CALCIUM 20 MG PO TABS: 20.0000 mg | ORAL_TABLET | Freq: Every day | ORAL | Status: DC

## 2012-01-28 MED ORDER — FUROSEMIDE 40 MG PO TABS: 40.0000 mg | ORAL_TABLET | Freq: Every day | ORAL | Status: DC

## 2012-01-28 NOTE — Progress Notes (Signed)
CARDIAC REHAB PHASE I   PRE:  Rate/Rhythm: 98 SR (in BR)    BP: sitting 128/86    SaO2: 98 RA  MODE:  Ambulation: 1040 ft   POST:  Rate/Rhythm: 108 ST    BP: sitting 140/90     SaO2: 93 RA  Tolerated very well. No need for O2 or RW. Feels great. Ed completed. Pt very motivated to change habits, get healthy. [id number]  Harriet Masson CES, ACSM

## 2012-01-28 NOTE — Discharge Summary (Signed)
I agree with the above discharge summary and plan for follow-up.  Amairani Shuey H  

## 2012-01-28 NOTE — Discharge Summary (Signed)
[address]            [place] [address]          [phone]         Discharge Summary  Name: Carolyn Fleming DOB: [birth year] 61 y.o. MRN: [id number]  Admission Date: 01/23/2012 Discharge Date:    Admitting Diagnosis: Chest pain  Discharge Diagnosis:  Acute myocardial infarction Coronary artery disease with critical left main stenosis Asthma Arthritis Sleep apnea, on CPAP History of breast cancer Gastroesophageal reflux disease Hyperlipidemia  Procedures: Procedure(s): EMERGENCY CORONARY ARTERY BYPASS GRAFTING x 2 (Left internal mammary artery to LAD, saphenous vein graft to circumflex), endoscopic vein harvest right thigh  - 01/24/2012   HPI:  The patient is a 61 y.o. female who presented to the emergency department at [facility] on the date of admission complaining of chest pain. She relates a one month history of exertional chest pain described as central pressure and tightness in her chest radiating to her neck. She has attributed these symptoms to asthma and acid reflux disease. She was seen in an outpatient primary care clinic 2 days prior to admission with similar symptoms, which were attributed to asthma. On the day of admission, she developed a more severe and persistent episode of chest tightness associated with nausea, vomiting, and diaphoresis. The pain persisted and became quite severe, ultimately prompting her to present to the emergency department. She was admitted to the hospital with some EKG changes and positive cardiac enzymes and ruled in for myocardial infarction. Chest pain ultimately was brought under control with intravenous nitroglycerin. She was subsequently admitted for further workup.    Hospital Course:  The patient was admitted to [facility] on 01/23/2012. She underwent cardiac catheterization demonstrating critical left main disease with subtotal occlusion of left anterior descending coronary artery. Emergent  cardiac surgical consultation was requested. Dr. Cornelius Moras saw the patient in the cath lab and agreed with the need for emergent surgical revascularization. All risks, benefits and alternatives of surgery were explained in detail, and the patient agreed to proceed. The patient was taken to the operating room and underwent the above procedure.    The postoperative course has generally been uneventful. She has been started on aspirin, a beta blocker, an ACE inhibitor, and a statin for her preoperative MI. She has remained afebrile and her vital signs have been stable. She is maintaining normal sinus rhythm. She did have a mild bump in her creatinine immediately postoperatively which is thought to be related to her catheterization, however this is improving. She is ambulating in the halls without difficulty. She has been diuresed for mild volume overload. She currently has been weaned from supplemental oxygen and is maintaining an O2 sats of greater than 90% on room air. We anticipate discharge home within the next 24-48 hours provided no acute changes occur.    Recent vital signs:  Filed Vitals:   01/28/12 0404  BP: 106/71  Pulse: 75  Temp: 97.8 F (36.6 C)  Resp: 20    Recent laboratory studies:  CBC: Basename 01/27/12 0500 01/26/12 0500  WBC 9.9 11.3*  HGB 10.0* 10.8*  HCT 30.6* 32.7*  PLT 157 173   BMET:  Basename 01/27/12 0500 01/26/12 0500  NA 139 139  K 4.2 3.9  CL 106 104  CO2 27 25  GLUCOSE 112* 131*  BUN 21 17  CREATININE 1.21* 1.23*  CALCIUM 8.2* 8.5    PT/INR: No results found for this basename: LABPROT,INR in the last 72 hours   Discharge Medications:   Medication List  As of 01/28/2012 11:02 AM   TAKE these medications         aspirin 325 MG EC tablet   Take 1 tablet (325 mg total) by mouth daily.      atorvastatin 20 MG tablet   Commonly known as: LIPITOR   Take 1 tablet (20 mg total) by mouth daily.      BETOPTIC-S OP   Apply 1 drop to eye 2 (two) times daily.        COMBIGAN OP   Apply 1 drop to eye 2 (two) times daily. 1 drop both eyes am & pm      dorzolamide 2 % ophthalmic solution   Commonly known as: TRUSOPT   Place 1 drop into both eyes 3 (three) times daily.      furosemide 40 MG tablet   Commonly known as: LASIX   Take 1 tablet (40 mg total) by mouth daily. X 5 days      lisinopril 5 MG tablet   Commonly known as: PRINIVIL,ZESTRIL   Take 1 tablet (5 mg total) by mouth daily.      loratadine 10 MG tablet   Commonly known as: CLARITIN   Take 10 mg by mouth daily.      LUMIGAN OP   Apply 1 drop to eye every evening.      metoprolol tartrate 25 MG tablet   Commonly known as: LOPRESSOR   Take 1 tablet (25 mg total) by mouth 2 (two) times daily.      mometasone 50 MCG/ACT nasal spray   Commonly known as: NASONEX   Place 2 sprays into the nose 2 (two) times daily.      omeprazole 20 MG capsule   Commonly known as: PRILOSEC   Take 20 mg by mouth daily.      oxyCODONE 5 MG immediate release tablet   Commonly known as: Oxy IR/ROXICODONE   Take 1-2 tablets (5-10 mg total) by mouth every 3 (three) hours as needed for pain.      potassium chloride SA 20 MEQ tablet   Commonly known as: K-DUR,KLOR-CON   Take 1 tablet (20 mEq total) by mouth daily. X 5 days      PROAIR HFA IN   Inhale into the lungs. Emergency inhaler for Asthma      sodium chloride 0.65 % nasal spray   Commonly known as: OCEAN   Place 1 spray into the nose as needed. Congestion.      XOPENEX 0.63 MG/3ML nebulizer solution   Generic drug: levalbuterol   Take 1 ampule by nebulization every 6 (six) hours as needed. Wheezing.            Discharge Instructions:  The patient is to refrain from driving, heavy lifting or strenuous activity.  May shower daily and clean incisions with soap and water.  May resume regular diet.  Discharge Orders    Future Orders Please Complete By Expires   Amb Referral to Cardiac Rehabilitation         Follow-up Information     Follow up with Lesleigh Noe, MD. Schedule an appointment as soon as possible for a visit in 2 weeks.   Contact information:   [address] [place] [address] [phone]       Follow up with [facility]  H, MD on 02/21/2012. (Have a chest x-ray at 12:00, then see Dr. Orvan July PA at 1:00)    Contact information:   [address] [place] [address] [phone]           Darlene Bartelt H 01/28/2012, 11:02 AM

## 2012-01-28 NOTE — Progress Notes (Addendum)
                    [address]            [place] [address]          [phone]     4 Days Post-Op Procedure(s) (LRB): CORONARY ARTERY BYPASS GRAFTING (CABG) (N/A)  Subjective: Feels well.  Currently off O2 and sats stable.  No complaints.  Objective: Vital signs in last 24 hours: Patient Vitals for the past 24 hrs:  BP Temp Temp src Pulse Resp SpO2 Weight  01/28/12 0404 106/71 mmHg 97.8 F (36.6 C) Oral 75  20  96 % 193 lb 2 oz (87.6 kg)  01/27/12 2117 117/79 mmHg 98.7 F (37.1 C) Oral 95  20  95 % -  01/27/12 1402 116/78 mmHg 97.3 F (36.3 C) Oral 84  18  98 % -  01/27/12 1022 109/63 mmHg - - - - - -   Current Weight  01/28/12 193 lb 2 oz (87.6 kg)     Intake/Output from previous day: 06/13 0701 - 06/14 0700 In: 840 [P.O.:840] Out: 200 [Urine:200]  CBGs 82-141-109  PHYSICAL EXAM:  Heart: RRR Lungs: clear  Wound: clean and dry Extremities:trace RLE edema  Lab Results: CBC: Basename 01/27/12 0500 01/26/12 0500  WBC 9.9 11.3*  HGB 10.0* 10.8*  HCT 30.6* 32.7*  PLT 157 173   BMET:  Basename 01/27/12 0500 01/26/12 0500  NA 139 139  K 4.2 3.9  CL 106 104  CO2 27 25  GLUCOSE 112* 131*  BUN 21 17  CREATININE 1.21* 1.23*  CALCIUM 8.2* 8.5    PT/INR: No results found for this basename: LABPROT,INR in the last 72 hours   Assessment/Plan: S/P Procedure(s) (LRB): CORONARY ARTERY BYPASS GRAFTING (CABG) (N/A) CV-stable, SR. Continue current meds. Vol overload- diurese. Cr stable at 1.2. Pulm- she had episodes of decreased sats with ambulation yesterday.  Will monitor today.  Off O2 at rest. Home this weekend if remains stable.   LOS: 5 days    COLLINS,GINA H 01/28/2012   I have seen and examined the patient and agree with the assessment and plan as outlined.  Gladyes Kudo H 01/28/2012 12:54 PM

## 2012-01-28 NOTE — Progress Notes (Signed)
She will be discharged tomorrow. She is doing very well. No arrhythmias

## 2012-01-29 DIAGNOSIS — I214 Non-ST elevation (NSTEMI) myocardial infarction: Secondary | ICD-10-CM

## 2012-01-29 LAB — GLUCOSE, CAPILLARY
Glucose-Capillary: 108 mg/dL — ABNORMAL HIGH (ref 70–99)
Glucose-Capillary: 86 mg/dL (ref 70–99)

## 2012-01-29 NOTE — Progress Notes (Signed)
CARDIAC REHAB PHASE I   PRE:  Rate/Rhythm: 95 SR  BP:  Supine:   Sitting: 122/80  Standing:    SaO2: Ra 94  MODE:  Ambulation: 550 ft   POST:  Rate/Rhythem:   BP:  Supine:   Sitting: 130/80  Standing:    SaO2: Ra 93  Prudy Candy Bailey  Ambulated in hallway with no complaints.   Pt with one productive cough episode.  Pt advised to splint with heart pillow.  Reinforced outpatient cardiac rehab expectations and will f/u on outpatient. Pt to chair to dress as she is anticipating going home today.  Family at bedside.

## 2012-01-29 NOTE — Progress Notes (Addendum)
[address]            [place] [address]          [phone]     5 Days Post-Op  Procedure(s) (LRB): CORONARY ARTERY BYPASS GRAFTING (CABG) (N/A) Subjective: Feeling well  Objective  Telemetry  Sinus rhythm  Temp:  [97 F (36.1 C)-98.3 F (36.8 C)] 97 F (36.1 C) (06/15 0405) Pulse Rate:  [83-96] 83  (06/15 0405) Resp:  [18-20] 18  (06/15 0405) BP: (121-129)/(74-90) 129/90 mmHg (06/15 0405) SpO2:  [91 %-96 %] 91 % (06/15 0405) Weight:  [193 lb 12.6 oz (87.9 kg)] 193 lb 12.6 oz (87.9 kg) (06/15 0405)  No intake or output data in the 24 hours ending 01/29/12 0749     General appearance: alert, cooperative and no distress Heart: regular rate and rhythm and S1, S2 normal Lungs: dim in bases Abdomen: soft, nontender Extremities: trace edema Wound: incisions healing well  Lab Results:  Basename 01/27/12 0500  NA 139  K 4.2  CL 106  CO2 27  GLUCOSE 112*  BUN 21  CREATININE 1.21*  CALCIUM 8.2*  MG --  PHOS --   No results found for this basename: AST:2,ALT:2,ALKPHOS:2,BILITOT:2,PROT:2,ALBUMIN:2 in the last 72 hours No results found for this basename: LIPASE:2,AMYLASE:2 in the last 72 hours  Basename 01/27/12 0500  WBC 9.9  NEUTROABS --  HGB 10.0*  HCT 30.6*  MCV 85.2  PLT 157   No results found for this basename: CKTOTAL:4,CKMB:4,TROPONINI:4 in the last 72 hours No components found with this basename: POCBNP:3 No results found for this basename: DDIMER in the last 72 hours No results found for this basename: HGBA1C in the last 72 hours No results found for this basename: CHOL,HDL,LDLCALC,TRIG,CHOLHDL in the last 72 hours No results found for this basename: TSH,T4TOTAL,FREET3,T3FREE,THYROIDAB in the last 72 hours No results found for this basename: VITAMINB12,FOLATE,FERRITIN,TIBC,IRON,RETICCTPCT in the last 72 hours  Medications: Scheduled    . acetaminophen  1,000 mg Oral Q6H  . aspirin EC  325 mg Oral Daily  .  atorvastatin  20 mg Oral q1800  . betaxolol  1 drop Both Eyes BID  . bimatoprost  1 drop Both Eyes QHS  . bisacodyl  10 mg Oral Daily   Or  . bisacodyl  10 mg Rectal Daily  . brimonidine  1 drop Both Eyes Q12H   And  . timolol  1 drop Both Eyes BID  . docusate sodium  200 mg Oral Daily  . dorzolamide  1 drop Both Eyes TID  . fluticasone  1 spray Each Nare Daily  . furosemide  40 mg Oral Daily  . insulin aspart  0-24 Units Subcutaneous TID AC & HS  . lisinopril  5 mg Oral Daily  . loratadine  10 mg Oral Daily  . metoprolol tartrate  25 mg Oral BID  . pantoprazole  40 mg Oral Q1200  . potassium chloride  20 mEq Oral Daily  . sodium chloride  3 mL Intravenous Q12H     Radiology/Studies:  No results found.  INR: Will add last result for INR, ABG once components are confirmed Will add last 4 CBG results once components are confirmed  Assessment/Plan: S/P Procedure(s) (LRB): CORONARY ARTERY BYPASS GRAFTING (CABG) (N/A) Plan for discharge: see discharge orders   LOS: 6 days    GOLD,WAYNE E 6/15/20137:49 AM   I have seen and  examined the patient and agree with the assessment and plan as outlined.  Deboraha Goar H 01/29/2012 10:43 AM

## 2012-02-02 ENCOUNTER — Ambulatory Visit (INDEPENDENT_AMBULATORY_CARE_PROVIDER_SITE_OTHER): Payer: Self-pay | Admitting: Physician Assistant

## 2012-02-02 VITALS — BP 110/79 | HR 91 | Resp 21 | Ht 62.0 in | Wt 190.0 lb

## 2012-02-02 DIAGNOSIS — I2510 Atherosclerotic heart disease of native coronary artery without angina pectoris: Secondary | ICD-10-CM

## 2012-02-02 DIAGNOSIS — Z0279 Encounter for issue of other medical certificate: Secondary | ICD-10-CM

## 2012-02-02 NOTE — Progress Notes (Signed)
[address]            [place] [address]          [phone]     HPI: Ms. Nawrot is a patient of Dr.Owen's who underwent emergency CABG x 2 on 01/24/2012.  Her post-operative course was uneventful, and she was discharged home in good condition on 01/29/2012.  Over the past 24 hours, she noticed the steri-strip came off her right chest tube site, and since then, it has been draining serous fluid.  She denies any fevers, chills or purulent drainage.  She comes in today for evaluation. She relates some right leg soreness when ambulating long distances. She has otherwise been doing well.    Current Outpatient Prescriptions  Medication Sig Dispense Refill  . Albuterol Sulfate (PROAIR HFA IN) Inhale into the lungs. Emergency inhaler for Asthma      . aspirin EC 325 MG EC tablet Take 1 tablet (325 mg total) by mouth daily.  30 tablet    . atorvastatin (LIPITOR) 20 MG tablet Take 1 tablet (20 mg total) by mouth daily.  30 tablet  1  . Betaxolol HCl (BETOPTIC-S OP) Apply 1 drop to eye 2 (two) times daily.       . Bimatoprost (LUMIGAN OP) Apply 1 drop to eye every evening.        . Brimonidine Tartrate-Timolol (COMBIGAN OP) Apply 1 drop to eye 2 (two) times daily. 1 drop both eyes am & pm      . dorzolamide (TRUSOPT) 2 % ophthalmic solution Place 1 drop into both eyes 3 (three) times daily.      . furosemide (LASIX) 40 MG tablet Take 1 tablet (40 mg total) by mouth daily. X 5 days  5 tablet  0  . levalbuterol (XOPENEX) 0.63 MG/3ML nebulizer solution Take 1 ampule by nebulization every 6 (six) hours as needed. Wheezing.      Marland Kitchen lisinopril (PRINIVIL,ZESTRIL) 5 MG tablet Take 1 tablet (5 mg total) by mouth daily.  30 tablet  1  . loratadine (CLARITIN) 10 MG tablet Take 10 mg by mouth daily.      . metoprolol tartrate (LOPRESSOR) 25 MG tablet Take 1 tablet (25 mg total) by mouth 2 (two) times daily.  60 tablet  1  . mometasone (NASONEX) 50 MCG/ACT nasal spray Place  2 sprays into the nose 2 (two) times daily.      Marland Kitchen omeprazole (PRILOSEC) 20 MG capsule Take 20 mg by mouth daily.      Marland Kitchen oxyCODONE (OXY IR/ROXICODONE) 5 MG immediate release tablet Take 1-2 tablets (5-10 mg total) by mouth every 3 (three) hours as needed for pain.  30 tablet  0  . potassium chloride SA (K-DUR,KLOR-CON) 20 MEQ tablet Take 1 tablet (20 mEq total) by mouth daily. X 5 days  5 tablet  0  . sodium chloride (OCEAN) 0.65 % nasal spray Place 1 spray into the nose as needed. Congestion.        Physical Exam: Wounds: Sternal wound is healing well with sutures in place in the lower 1/3 of the incision.  Right leg EVH site is clean and dry.  Her right chest tube site is superficially separated, and weeping some serous fluid.  No surrounding erythema or purulence. Heart: regular rate and rhythm Lungs: clear Extremities: no significant edema, no calf tenderness     Assessment/Plan:  The patient has a superficial separation of her chest tube site, which is being worsened by her pendulous breasts.  There is some fat necrosis, but no obvious infection.  I have started her on saline wet to dry dressing changes, and encouraged her to wear a supportive bra if possible.  We will see her back in 1 week to take out her sternal sutures and recheck her chest tube site.  She is instructed to call in the interim if she develops redness or purulence from the chest tube site.

## 2012-02-07 DIAGNOSIS — I25810 Atherosclerosis of coronary artery bypass graft(s) without angina pectoris: Secondary | ICD-10-CM | POA: Insufficient documentation

## 2012-02-07 DIAGNOSIS — I252 Old myocardial infarction: Secondary | ICD-10-CM | POA: Insufficient documentation

## 2012-02-09 ENCOUNTER — Ambulatory Visit (INDEPENDENT_AMBULATORY_CARE_PROVIDER_SITE_OTHER): Payer: Self-pay | Admitting: *Deleted

## 2012-02-09 VITALS — Ht 62.0 in | Wt 191.0 lb

## 2012-02-09 DIAGNOSIS — Z09 Encounter for follow-up examination after completed treatment for conditions other than malignant neoplasm: Secondary | ICD-10-CM

## 2012-02-09 DIAGNOSIS — I2510 Atherosclerotic heart disease of native coronary artery without angina pectoris: Secondary | ICD-10-CM

## 2012-02-09 DIAGNOSIS — Z4802 Encounter for removal of sutures: Secondary | ICD-10-CM

## 2012-02-10 ENCOUNTER — Other Ambulatory Visit: Payer: Self-pay | Admitting: Thoracic Surgery (Cardiothoracic Vascular Surgery)

## 2012-02-10 DIAGNOSIS — I2510 Atherosclerotic heart disease of native coronary artery without angina pectoris: Secondary | ICD-10-CM

## 2012-02-10 NOTE — Progress Notes (Signed)
Carolyn Fleming returns for suture removal from the distal portion of her sternal incision. The sternal incision as well as ct sites and evh incision are all very well healed. She had said that she had been having a lot of pulling sensation in her chest particularly at night when trying to sleep.  But, after I removed the sutures she said that feeling disappeared.  She is having no other problems.  No leg or ankle edema. Appetite and bowels O.k., using Colace.  She will return next week with cxr as scheduled.

## 2012-02-11 ENCOUNTER — Other Ambulatory Visit: Payer: Self-pay | Admitting: *Deleted

## 2012-02-11 DIAGNOSIS — G8918 Other acute postprocedural pain: Secondary | ICD-10-CM

## 2012-02-11 MED ORDER — HYDROCODONE-ACETAMINOPHEN 7.5-500 MG PO TABS: 1.0000 | ORAL_TABLET | Freq: Four times a day (QID) | ORAL | Status: AC | PRN

## 2012-02-14 ENCOUNTER — Other Ambulatory Visit: Payer: Self-pay | Admitting: Thoracic Surgery (Cardiothoracic Vascular Surgery)

## 2012-02-14 DIAGNOSIS — I2510 Atherosclerotic heart disease of native coronary artery without angina pectoris: Secondary | ICD-10-CM

## 2012-02-21 ENCOUNTER — Encounter: Payer: Self-pay | Admitting: Physician Assistant

## 2012-02-21 ENCOUNTER — Ambulatory Visit
Admission: RE | Admit: 2012-02-21 | Discharge: 2012-02-21 | Disposition: A | Discharge status: Home / Self Care | Payer: BC Managed Care – PPO | Source: Ambulatory Visit | Attending: Thoracic Surgery (Cardiothoracic Vascular Surgery) | Admitting: Thoracic Surgery (Cardiothoracic Vascular Surgery)

## 2012-02-21 ENCOUNTER — Ambulatory Visit (INDEPENDENT_AMBULATORY_CARE_PROVIDER_SITE_OTHER): Payer: Self-pay | Admitting: Physician Assistant

## 2012-02-21 VITALS — BP 138/89 | HR 68 | Resp 16 | Ht 62.0 in | Wt 189.0 lb

## 2012-02-21 DIAGNOSIS — I2510 Atherosclerotic heart disease of native coronary artery without angina pectoris: Secondary | ICD-10-CM

## 2012-02-21 DIAGNOSIS — Z09 Encounter for follow-up examination after completed treatment for conditions other than malignant neoplasm: Secondary | ICD-10-CM

## 2012-02-21 NOTE — Progress Notes (Addendum)
HPI:  Patient returns for follow-up having undergone an emergent CABGx2 on 01/24/2012 by Dr. Cornelius Moras. The patient has already been seen in routine follow up 02/02/2012. She was also seen on 02/10/2012 to have her chest sutures removed. Patient reports that overall, she is doing fairly well. She has complaints of tingling and occasional pain of all of her right toes, and some incisional pain if she lies on her right side.Also, she thinks she has a sinus infection and has an appointment to see her medical doctor today. She denies any shortness of breath, fever, chills, or chest pain.   Current Outpatient Prescriptions  Medication Sig Dispense Refill  . Albuterol Sulfate (PROAIR HFA IN) Inhale into the lungs. Emergency inhaler for Asthma      . atorvastatin (LIPITOR) 20 MG tablet Take 1 tablet (20 mg total) by mouth daily.  30 tablet  1  . Betaxolol HCl (BETOPTIC-S OP) Apply 1 drop to eye 2 (two) times daily.       . Bimatoprost (LUMIGAN OP) Apply 1 drop to eye every evening.        . Brimonidine Tartrate-Timolol (COMBIGAN OP) Apply 1 drop to eye 2 (two) times daily. 1 drop both eyes am & pm      . dorzolamide (TRUSOPT) 2 % ophthalmic solution Place 1 drop into both eyes 3 (three) times daily.      Marland Kitchen HYDROcodone-acetaminophen (LORTAB 7.5) 7.5-500 MG per tablet Take 1 tablet by mouth every 6 (six) hours as needed for pain (may take one or two tablets every 4-6 hrs prn).  40 tablet  0  . levalbuterol (XOPENEX) 0.63 MG/3ML nebulizer solution Take 1 ampule by nebulization every 6 (six) hours as needed. Wheezing.      Marland Kitchen lisinopril (PRINIVIL,ZESTRIL) 5 MG tablet Take 1 tablet (5 mg total) by mouth daily.  30 tablet  1  . loratadine (CLARITIN) 10 MG tablet Take 10 mg by mouth daily.      . metoprolol tartrate (LOPRESSOR) 25 MG tablet Take 1 tablet (25 mg total) by mouth 2 (two) times daily.  60 tablet  1  . mometasone (NASONEX) 50 MCG/ACT nasal spray Place 2 sprays into the nose 2 (two) times daily.        Marland Kitchen omeprazole (PRILOSEC) 20 MG capsule Take 20 mg by mouth daily.      . potassium chloride SA (K-DUR,KLOR-CON) 20 MEQ tablet Take 1 tablet (20 mEq total) by mouth daily. X 5 days  5 tablet  0  . sodium chloride (OCEAN) 0.65 % nasal spray Place 1 spray into the nose as needed. Congestion.      Vital Signs: BP 138/58, HR 68, RR 16, and O2 sat 98% on room air  Physical Exam: Cardiovascular:RRR Pulmonary:Clear Abdomen:Soft, non tender, bowel sounds present Extremities:No edema. Wounds:Clean, dry, and no signs of infection. The middle chest tube wound had a small eschar that was removed. No signs of sternal instability.  Diagnostic Tests: PA and LAT CXR done today shows no pneumothorax and resolution of previously seen bibasilar atelectasis.  Impression and Plan: Overall, she is making steady progress s/p emergent CABGx2. She was instructed as long as she is not taking narcotics for pain, she may begin driving short distances (i.e. 30 minutes or less during the day for the next week).She may then increase her frequency and duration as tolerates. She was told to continue with sternal precautions (i.e. No lifting more than 10 pounds for the next 4 weeks). She has been contacted to  participate in cardiac rehab, which she is interested in doing. She was instructed she is able to participate. She was told her HGA1C pre op was 6.3. She needs to be seen by her medical doctor regarding further follow up. I did instruct he on the importance of remaining on a diabetic diet.Regarding her right toe tingling and pain, her pulses are intact and her foot is warm. EVH was not done below the knee so this is not the etiology. Dr. Cornelius Moras has seen and evaluated her and we are unsure of the etiology of her right toe tingling. She was told to see her a medical doctor if this does not improve or worsens. She has already been seen by Dr. Katrinka Blazing. She will return to see [place] PRN.

## 2012-03-02 ENCOUNTER — Encounter (HOSPITAL_COMMUNITY)
Admission: RE | Admit: 2012-03-02 | Discharge: 2012-03-02 | Disposition: A | Discharge status: Home / Self Care | Payer: BC Managed Care – PPO | Source: Ambulatory Visit | Attending: Interventional Cardiology | Admitting: Interventional Cardiology

## 2012-03-02 DIAGNOSIS — Z853 Personal history of malignant neoplasm of breast: Secondary | ICD-10-CM | POA: Insufficient documentation

## 2012-03-02 DIAGNOSIS — Z951 Presence of aortocoronary bypass graft: Secondary | ICD-10-CM | POA: Insufficient documentation

## 2012-03-02 DIAGNOSIS — I2510 Atherosclerotic heart disease of native coronary artery without angina pectoris: Secondary | ICD-10-CM | POA: Insufficient documentation

## 2012-03-02 DIAGNOSIS — M129 Arthropathy, unspecified: Secondary | ICD-10-CM | POA: Insufficient documentation

## 2012-03-02 DIAGNOSIS — Z91040 Latex allergy status: Secondary | ICD-10-CM | POA: Insufficient documentation

## 2012-03-02 DIAGNOSIS — J45909 Unspecified asthma, uncomplicated: Secondary | ICD-10-CM | POA: Insufficient documentation

## 2012-03-02 DIAGNOSIS — K219 Gastro-esophageal reflux disease without esophagitis: Secondary | ICD-10-CM | POA: Insufficient documentation

## 2012-03-02 DIAGNOSIS — I2584 Coronary atherosclerosis due to calcified coronary lesion: Secondary | ICD-10-CM | POA: Insufficient documentation

## 2012-03-02 DIAGNOSIS — I214 Non-ST elevation (NSTEMI) myocardial infarction: Secondary | ICD-10-CM | POA: Insufficient documentation

## 2012-03-02 DIAGNOSIS — Z885 Allergy status to narcotic agent status: Secondary | ICD-10-CM | POA: Insufficient documentation

## 2012-03-02 DIAGNOSIS — E785 Hyperlipidemia, unspecified: Secondary | ICD-10-CM | POA: Insufficient documentation

## 2012-03-02 DIAGNOSIS — Z882 Allergy status to sulfonamides status: Secondary | ICD-10-CM | POA: Insufficient documentation

## 2012-03-02 DIAGNOSIS — I10 Essential (primary) hypertension: Secondary | ICD-10-CM | POA: Insufficient documentation

## 2012-03-02 DIAGNOSIS — G4730 Sleep apnea, unspecified: Secondary | ICD-10-CM | POA: Insufficient documentation

## 2012-03-02 DIAGNOSIS — Z7982 Long term (current) use of aspirin: Secondary | ICD-10-CM | POA: Insufficient documentation

## 2012-03-02 DIAGNOSIS — Z5189 Encounter for other specified aftercare: Secondary | ICD-10-CM | POA: Insufficient documentation

## 2012-03-02 NOTE — Progress Notes (Signed)
Cardiac Rehab Medication Review by a Pharmacist  Does the patient  feel that his/her medications are working for him/her?  yes  Has the patient been experiencing any side effects to the medications prescribed?  yes  Does the patient measure his/her own blood pressure or blood glucose at home?  no   Does the patient have any problems obtaining medications due to transportation or finances?   no  Understanding of regimen: good Understanding of indications: good Potential of compliance: good    Pharmacist comments: Medications reviewed, allergies reviewed, pt states compliance with all medications, no adverse events. Pt has a good understanding of medication regimen.  Abran Duke, PharmD Clinical Pharmacist Phone: [phone] Pager: [phone] 03/02/2012 8:15 AM

## 2012-03-06 ENCOUNTER — Encounter (HOSPITAL_COMMUNITY)
Admission: RE | Admit: 2012-03-06 | Discharge: 2012-03-06 | Disposition: A | Discharge status: Home / Self Care | Payer: BC Managed Care – PPO | Source: Ambulatory Visit | Attending: Interventional Cardiology | Admitting: Interventional Cardiology

## 2012-03-06 NOTE — Progress Notes (Signed)
Pt started cardiac rehab today.  Pt tolerated light exercise without difficulty. Monitor showed sr with no noted ectopy.  Continue to monitor.

## 2012-03-08 ENCOUNTER — Encounter (HOSPITAL_COMMUNITY)
Admission: RE | Admit: 2012-03-08 | Discharge: 2012-03-08 | Disposition: A | Discharge status: Home / Self Care | Payer: BC Managed Care – PPO | Source: Ambulatory Visit | Attending: Interventional Cardiology | Admitting: Interventional Cardiology

## 2012-03-10 ENCOUNTER — Encounter (HOSPITAL_COMMUNITY)
Admission: RE | Admit: 2012-03-10 | Discharge: 2012-03-10 | Disposition: A | Discharge status: Home / Self Care | Payer: BC Managed Care – PPO | Source: Ambulatory Visit | Attending: Interventional Cardiology | Admitting: Interventional Cardiology

## 2012-03-10 NOTE — Progress Notes (Signed)
Reviewed home exercise with pt today.  Pt plans to walk at home for exercise.  Reviewed THR, pulse, RPE, sign and symptoms, and when to call 911 or MD.  Pt voiced understanding. Electronically signed by Harriett Sine MS on Friday March 10 2012 at 1205

## 2012-03-13 ENCOUNTER — Encounter (HOSPITAL_COMMUNITY): Payer: BC Managed Care – PPO

## 2012-03-13 ENCOUNTER — Telehealth (HOSPITAL_COMMUNITY): Payer: Self-pay | Admitting: *Deleted

## 2012-03-15 ENCOUNTER — Encounter (HOSPITAL_COMMUNITY)
Admission: RE | Admit: 2012-03-15 | Discharge: 2012-03-15 | Disposition: A | Discharge status: Home / Self Care | Payer: BC Managed Care – PPO | Source: Ambulatory Visit | Attending: Interventional Cardiology | Admitting: Interventional Cardiology

## 2012-03-15 NOTE — Progress Notes (Signed)
Pt returned to exercise today after arriving on Monday with c/o right breast pain that would come and go.  Rehab staff talked to Marjean Donna, Dr. Michaelle Copas nurse regarding pt's complaint of sharp pain intermittently since Saturday.  Based upon findings, pt advised to call primary Md office.  Pt was able to be seen and primary MD prescribed Mobic for pt's complaint.  Per Pt, Md felt discomfort was due to muscular irritation from her CABG.   Pt reports no additional discomfort after starting the new medication. Pt able to tolerate exercise with no complaints.

## 2012-03-17 ENCOUNTER — Encounter (HOSPITAL_COMMUNITY)
Admission: RE | Admit: 2012-03-17 | Discharge: 2012-03-17 | Disposition: A | Discharge status: Home / Self Care | Payer: BC Managed Care – PPO | Source: Ambulatory Visit | Attending: Interventional Cardiology | Admitting: Interventional Cardiology

## 2012-03-17 DIAGNOSIS — J45909 Unspecified asthma, uncomplicated: Secondary | ICD-10-CM | POA: Insufficient documentation

## 2012-03-17 DIAGNOSIS — I10 Essential (primary) hypertension: Secondary | ICD-10-CM | POA: Insufficient documentation

## 2012-03-17 DIAGNOSIS — I2584 Coronary atherosclerosis due to calcified coronary lesion: Secondary | ICD-10-CM | POA: Insufficient documentation

## 2012-03-17 DIAGNOSIS — Z7982 Long term (current) use of aspirin: Secondary | ICD-10-CM | POA: Insufficient documentation

## 2012-03-17 DIAGNOSIS — E785 Hyperlipidemia, unspecified: Secondary | ICD-10-CM | POA: Insufficient documentation

## 2012-03-17 DIAGNOSIS — Z853 Personal history of malignant neoplasm of breast: Secondary | ICD-10-CM | POA: Insufficient documentation

## 2012-03-17 DIAGNOSIS — Z5189 Encounter for other specified aftercare: Secondary | ICD-10-CM | POA: Insufficient documentation

## 2012-03-17 DIAGNOSIS — I2510 Atherosclerotic heart disease of native coronary artery without angina pectoris: Secondary | ICD-10-CM | POA: Insufficient documentation

## 2012-03-17 DIAGNOSIS — I214 Non-ST elevation (NSTEMI) myocardial infarction: Secondary | ICD-10-CM | POA: Insufficient documentation

## 2012-03-17 DIAGNOSIS — Z91040 Latex allergy status: Secondary | ICD-10-CM | POA: Insufficient documentation

## 2012-03-17 DIAGNOSIS — K219 Gastro-esophageal reflux disease without esophagitis: Secondary | ICD-10-CM | POA: Insufficient documentation

## 2012-03-17 DIAGNOSIS — Z951 Presence of aortocoronary bypass graft: Secondary | ICD-10-CM | POA: Insufficient documentation

## 2012-03-17 DIAGNOSIS — G4730 Sleep apnea, unspecified: Secondary | ICD-10-CM | POA: Insufficient documentation

## 2012-03-17 DIAGNOSIS — M129 Arthropathy, unspecified: Secondary | ICD-10-CM | POA: Insufficient documentation

## 2012-03-17 DIAGNOSIS — Z882 Allergy status to sulfonamides status: Secondary | ICD-10-CM | POA: Insufficient documentation

## 2012-03-17 DIAGNOSIS — Z885 Allergy status to narcotic agent status: Secondary | ICD-10-CM | POA: Insufficient documentation

## 2012-03-20 ENCOUNTER — Encounter (HOSPITAL_COMMUNITY)
Admission: RE | Admit: 2012-03-20 | Discharge: 2012-03-20 | Disposition: A | Discharge status: Home / Self Care | Payer: BC Managed Care – PPO | Source: Ambulatory Visit | Attending: Interventional Cardiology | Admitting: Interventional Cardiology

## 2012-03-20 NOTE — Progress Notes (Signed)
Pt has an upcoming appointment on Wednesday with Dr. Katrinka Blazing.  Will send Bp reports over for MD review.

## 2012-03-22 ENCOUNTER — Encounter (HOSPITAL_COMMUNITY)
Admission: RE | Admit: 2012-03-22 | Discharge: 2012-03-22 | Disposition: A | Discharge status: Home / Self Care | Payer: BC Managed Care – PPO | Source: Ambulatory Visit | Attending: Interventional Cardiology | Admitting: Interventional Cardiology

## 2012-03-24 ENCOUNTER — Encounter (HOSPITAL_COMMUNITY)
Admission: RE | Admit: 2012-03-24 | Discharge: 2012-03-24 | Disposition: A | Discharge status: Home / Self Care | Payer: BC Managed Care – PPO | Source: Ambulatory Visit | Attending: Interventional Cardiology | Admitting: Interventional Cardiology

## 2012-03-24 NOTE — Progress Notes (Signed)
Pt seen in the office by Dr. Katrinka Blazing.  Pt with medication changes.  Pt will decrease Metoprolol to 1/2 twice a day, Crestor 10mg  every Monday and Friday.  Pt has also added Losartan but she is unsure of the dose. Pt will bring the dosage in on Monday when she returns to exercise.

## 2012-03-27 ENCOUNTER — Encounter (HOSPITAL_COMMUNITY)
Admission: RE | Admit: 2012-03-27 | Discharge: 2012-03-27 | Disposition: A | Discharge status: Home / Self Care | Payer: BC Managed Care – PPO | Source: Ambulatory Visit | Attending: Interventional Cardiology | Admitting: Interventional Cardiology

## 2012-03-27 NOTE — Progress Notes (Signed)
Pt with new medication.  Mobic d/c'd due to rash pt developed after starting the medication.  Pt brought her Losartan in for dose.  Medication list reconciled.

## 2012-03-29 ENCOUNTER — Encounter (HOSPITAL_COMMUNITY)
Admission: RE | Admit: 2012-03-29 | Discharge: 2012-03-29 | Disposition: A | Discharge status: Home / Self Care | Payer: BC Managed Care – PPO | Source: Ambulatory Visit | Attending: Interventional Cardiology | Admitting: Interventional Cardiology

## 2012-03-31 ENCOUNTER — Encounter (HOSPITAL_COMMUNITY)
Admission: RE | Admit: 2012-03-31 | Discharge: 2012-03-31 | Disposition: A | Discharge status: Home / Self Care | Payer: BC Managed Care – PPO | Source: Ambulatory Visit | Attending: Interventional Cardiology | Admitting: Interventional Cardiology

## 2012-04-03 ENCOUNTER — Encounter (HOSPITAL_COMMUNITY)
Admission: RE | Admit: 2012-04-03 | Discharge: 2012-04-03 | Disposition: A | Discharge status: Home / Self Care | Payer: BC Managed Care – PPO | Source: Ambulatory Visit | Attending: Interventional Cardiology | Admitting: Interventional Cardiology

## 2012-04-05 ENCOUNTER — Encounter (HOSPITAL_COMMUNITY)
Admission: RE | Admit: 2012-04-05 | Discharge: 2012-04-05 | Disposition: A | Discharge status: Home / Self Care | Payer: BC Managed Care – PPO | Source: Ambulatory Visit | Attending: Interventional Cardiology | Admitting: Interventional Cardiology

## 2012-04-07 ENCOUNTER — Encounter (HOSPITAL_COMMUNITY)
Admission: RE | Admit: 2012-04-07 | Discharge: 2012-04-07 | Disposition: A | Discharge status: Home / Self Care | Payer: BC Managed Care – PPO | Source: Ambulatory Visit | Attending: Interventional Cardiology | Admitting: Interventional Cardiology

## 2012-04-07 NOTE — Progress Notes (Signed)
Carolyn Fleming 61 y.o. female Nutrition Note Spoke with pt.  Nutrition Plan, Nutrition Survey, and cholesterol goals reviewed with pt. Pt is following Step 2 of the Therapeutic Lifestyle Changes diet. Pt is pre-diabetic. Pre-diabetes discussed. Pt reports a family history of diabetes. Pt denies ever being told she was pre-diabetic. Pt encouraged to talk with her PCP re: pre-diabetes. Per discussion with pt, pt pre-surgery wt was 193# (87.7 kg). Pt wt today was 179.5 (81.6 kg). Pt wt is down 13.5# from pt's reported UBW. Pt ? If she is eating enough because "sometimes I get really hungry after dinner." Weight loss tips discussed. Pt states she walks 30 minutes/day and her walking is limited by leg pain. Per pt, "I've learned to rest for 6 minutes and then get up and go again." Pt encouraged to work up to 45 minutes/day according to EP home exercise guidelines. Pt expressed understanding. Pt c/o constipation on the nutrition screen. Pt states she has to take Miralax once every 2-3 days. Pt encouraged to call her MD office and ask if she should be taking Miralax more frequently since constipation has been chronic since her heart event/new medications started. Pt stated she would call her MD re: Miralax today.  Nutrition Diagnosis   Food-and nutrition-related knowledge deficit related to lack of exposure to information as related to diagnosis of: ? CVD ? Pre-DM (A1c 6.4)   Obesity related to excessive energy intake as evidenced by a BMI of 33.3  Nutrition RX/ Estimated Daily Nutrition Needs for: wt loss  1300-1550 Kcal, 35-40 gm fat, 10-12 gm sat fat, 1.3-1.6 gm trans-fat, <1500 mg sodium  Nutrition Intervention   Pt's individual nutrition plan including cholesterol goals reviewed with pt.   Benefits of adopting Therapeutic Lifestyle Changes discussed when Medficts reviewed.   Pt to attend the Portion Distortion class    Pt given handouts for: ? wt loss ? pre-DM ? 1500 kcal, 5-day menu ideas ? Nutrition I  class ? Nutrition II class   Continue client-centered nutrition education by RD, as part of interdisciplinary care. Goal(s)   Pt to identify food quantities necessary to achieve: ? wt loss to a goal wt of 160-172 lb (72.9-78.3 kg) at graduation from cardiac rehab.  Monitor and Evaluate progress toward nutrition goal with team.  Nutrition Risk: change to Moderate Risk

## 2012-04-10 ENCOUNTER — Encounter (HOSPITAL_COMMUNITY)
Admission: RE | Admit: 2012-04-10 | Discharge: 2012-04-10 | Disposition: A | Discharge status: Home / Self Care | Payer: BC Managed Care – PPO | Source: Ambulatory Visit | Attending: Interventional Cardiology | Admitting: Interventional Cardiology

## 2012-04-12 ENCOUNTER — Encounter (HOSPITAL_COMMUNITY)
Admission: RE | Admit: 2012-04-12 | Discharge: 2012-04-12 | Disposition: A | Discharge status: Home / Self Care | Payer: BC Managed Care – PPO | Source: Ambulatory Visit | Attending: Interventional Cardiology | Admitting: Interventional Cardiology

## 2012-04-12 NOTE — Progress Notes (Signed)
Pt will return to work on next Tuesday.  Pt will work 1/2 day for two weeks then resume full time work hours.  Pt will switch to 1:15 exercise class for 2 weeks then to 2:45 for remainder of the participation.

## 2012-04-14 ENCOUNTER — Encounter (HOSPITAL_COMMUNITY): Admission: RE | Admit: 2012-04-14 | Payer: BC Managed Care – PPO | Source: Ambulatory Visit

## 2012-04-14 ENCOUNTER — Encounter (HOSPITAL_COMMUNITY)
Admission: RE | Admit: 2012-04-14 | Discharge: 2012-04-14 | Disposition: A | Discharge status: Home / Self Care | Payer: BC Managed Care – PPO | Source: Ambulatory Visit | Attending: Interventional Cardiology | Admitting: Interventional Cardiology

## 2012-04-19 ENCOUNTER — Encounter (HOSPITAL_COMMUNITY): Payer: BC Managed Care – PPO

## 2012-04-19 ENCOUNTER — Encounter (HOSPITAL_COMMUNITY)
Admission: RE | Admit: 2012-04-19 | Discharge: 2012-04-19 | Disposition: A | Discharge status: Home / Self Care | Payer: BC Managed Care – PPO | Source: Ambulatory Visit | Attending: Interventional Cardiology | Admitting: Interventional Cardiology

## 2012-04-19 DIAGNOSIS — E785 Hyperlipidemia, unspecified: Secondary | ICD-10-CM | POA: Insufficient documentation

## 2012-04-19 DIAGNOSIS — I10 Essential (primary) hypertension: Secondary | ICD-10-CM | POA: Insufficient documentation

## 2012-04-19 DIAGNOSIS — G4730 Sleep apnea, unspecified: Secondary | ICD-10-CM | POA: Insufficient documentation

## 2012-04-19 DIAGNOSIS — I2510 Atherosclerotic heart disease of native coronary artery without angina pectoris: Secondary | ICD-10-CM | POA: Insufficient documentation

## 2012-04-19 DIAGNOSIS — M129 Arthropathy, unspecified: Secondary | ICD-10-CM | POA: Insufficient documentation

## 2012-04-19 DIAGNOSIS — Z882 Allergy status to sulfonamides status: Secondary | ICD-10-CM | POA: Insufficient documentation

## 2012-04-19 DIAGNOSIS — Z7982 Long term (current) use of aspirin: Secondary | ICD-10-CM | POA: Insufficient documentation

## 2012-04-19 DIAGNOSIS — Z951 Presence of aortocoronary bypass graft: Secondary | ICD-10-CM | POA: Insufficient documentation

## 2012-04-19 DIAGNOSIS — Z885 Allergy status to narcotic agent status: Secondary | ICD-10-CM | POA: Insufficient documentation

## 2012-04-19 DIAGNOSIS — J45909 Unspecified asthma, uncomplicated: Secondary | ICD-10-CM | POA: Insufficient documentation

## 2012-04-19 DIAGNOSIS — Z853 Personal history of malignant neoplasm of breast: Secondary | ICD-10-CM | POA: Insufficient documentation

## 2012-04-19 DIAGNOSIS — Z5189 Encounter for other specified aftercare: Secondary | ICD-10-CM | POA: Insufficient documentation

## 2012-04-19 DIAGNOSIS — I2584 Coronary atherosclerosis due to calcified coronary lesion: Secondary | ICD-10-CM | POA: Insufficient documentation

## 2012-04-19 DIAGNOSIS — K219 Gastro-esophageal reflux disease without esophagitis: Secondary | ICD-10-CM | POA: Insufficient documentation

## 2012-04-19 DIAGNOSIS — I214 Non-ST elevation (NSTEMI) myocardial infarction: Secondary | ICD-10-CM | POA: Insufficient documentation

## 2012-04-19 DIAGNOSIS — Z91040 Latex allergy status: Secondary | ICD-10-CM | POA: Insufficient documentation

## 2012-04-19 NOTE — Progress Notes (Signed)
Carolyn Fleming 61 y.o. female Nutrition Note Spoke with pt. Pt concerned re: lipid results received yesterday. Updated lipids not found in computer. Pt reports "everything looks good except my LDL keeps going up." Pt unable to take more than 10 mg Lipitor on Monday and Friday due to severe leg pain. Pt states she has tried "every cholesterol lowering medication" and has not been able to tolerate any of them. Pt educated re: Heart Healthy Fiber Tips (e.g. Increasing soluble fiber in the diet and adding plant stanol/sterol esters). Pt expressed understanding.  Nutrition Diagnosis   Food-and nutrition-related knowledge deficit related to lack of exposure to information as related to diagnosis of: ? CVD ? Pre-DM (A1c 6.4)   Obesity related to excessive energy intake as evidenced by a BMI of 33.3  Nutrition RX/ Estimated Daily Nutrition Needs for: wt loss  1300-1550 Kcal, 35-40 gm fat, 10-12 gm sat fat, 1.3-1.6 gm trans-fat, <1500 mg sodium  Nutrition Intervention   Pt's individual nutrition plan reviewed with pt including increasing soluble fiber and plant stanol/sterol esters in the diet.    Pt given handouts for: ? Heart Healthy Eating: Fiber Tips   Continue client-centered nutrition education by RD, as part of interdisciplinary care. Goal(s)   Pt to identify food quantities necessary to achieve: ? wt loss to a goal wt of 160-172 lb (72.9-78.3 kg) at graduation from cardiac rehab.  Monitor and Evaluate progress toward nutrition goal with team.  Nutrition Risk: Moderate Risk

## 2012-04-21 ENCOUNTER — Encounter (HOSPITAL_COMMUNITY): Payer: BC Managed Care – PPO

## 2012-04-21 ENCOUNTER — Encounter (HOSPITAL_COMMUNITY)
Admission: RE | Admit: 2012-04-21 | Discharge: 2012-04-21 | Disposition: A | Discharge status: Home / Self Care | Payer: BC Managed Care – PPO | Source: Ambulatory Visit | Attending: Interventional Cardiology | Admitting: Interventional Cardiology

## 2012-04-24 ENCOUNTER — Encounter (HOSPITAL_COMMUNITY)
Admission: RE | Admit: 2012-04-24 | Discharge: 2012-04-24 | Disposition: A | Discharge status: Home / Self Care | Payer: BC Managed Care – PPO | Source: Ambulatory Visit | Attending: Interventional Cardiology | Admitting: Interventional Cardiology

## 2012-04-24 ENCOUNTER — Encounter (HOSPITAL_COMMUNITY): Payer: BC Managed Care – PPO

## 2012-04-26 ENCOUNTER — Encounter (HOSPITAL_COMMUNITY): Payer: BC Managed Care – PPO

## 2012-04-26 ENCOUNTER — Encounter (HOSPITAL_COMMUNITY)
Admission: RE | Admit: 2012-04-26 | Discharge: 2012-04-26 | Disposition: A | Discharge status: Home / Self Care | Payer: BC Managed Care – PPO | Source: Ambulatory Visit | Attending: Interventional Cardiology | Admitting: Interventional Cardiology

## 2012-04-28 ENCOUNTER — Encounter (HOSPITAL_COMMUNITY)
Admission: RE | Admit: 2012-04-28 | Discharge: 2012-04-28 | Disposition: A | Discharge status: Home / Self Care | Payer: BC Managed Care – PPO | Source: Ambulatory Visit | Attending: Interventional Cardiology | Admitting: Interventional Cardiology

## 2012-04-28 ENCOUNTER — Encounter (HOSPITAL_COMMUNITY): Payer: BC Managed Care – PPO

## 2012-05-01 ENCOUNTER — Encounter (HOSPITAL_COMMUNITY)
Admission: RE | Admit: 2012-05-01 | Discharge: 2012-05-01 | Disposition: A | Discharge status: Home / Self Care | Payer: BC Managed Care – PPO | Source: Ambulatory Visit | Attending: Interventional Cardiology | Admitting: Interventional Cardiology

## 2012-05-01 ENCOUNTER — Encounter (HOSPITAL_COMMUNITY): Payer: BC Managed Care – PPO

## 2012-05-03 ENCOUNTER — Encounter (HOSPITAL_COMMUNITY)
Admission: RE | Admit: 2012-05-03 | Discharge: 2012-05-03 | Disposition: A | Discharge status: Home / Self Care | Payer: BC Managed Care – PPO | Source: Ambulatory Visit | Attending: Interventional Cardiology | Admitting: Interventional Cardiology

## 2012-05-03 ENCOUNTER — Encounter (HOSPITAL_COMMUNITY): Payer: BC Managed Care – PPO

## 2012-05-05 ENCOUNTER — Encounter (HOSPITAL_COMMUNITY)
Admission: RE | Admit: 2012-05-05 | Discharge: 2012-05-05 | Disposition: A | Discharge status: Home / Self Care | Payer: BC Managed Care – PPO | Source: Ambulatory Visit | Attending: Interventional Cardiology | Admitting: Interventional Cardiology

## 2012-05-05 ENCOUNTER — Encounter (HOSPITAL_COMMUNITY): Payer: BC Managed Care – PPO

## 2012-05-08 ENCOUNTER — Encounter (HOSPITAL_COMMUNITY): Payer: BC Managed Care – PPO

## 2012-05-08 ENCOUNTER — Encounter (HOSPITAL_COMMUNITY)
Admission: RE | Admit: 2012-05-08 | Discharge: 2012-05-08 | Disposition: A | Discharge status: Home / Self Care | Payer: BC Managed Care – PPO | Source: Ambulatory Visit | Attending: Interventional Cardiology | Admitting: Interventional Cardiology

## 2012-05-10 ENCOUNTER — Encounter (HOSPITAL_COMMUNITY): Payer: BC Managed Care – PPO

## 2012-05-10 ENCOUNTER — Encounter (HOSPITAL_COMMUNITY)
Admission: RE | Admit: 2012-05-10 | Discharge: 2012-05-10 | Disposition: A | Discharge status: Home / Self Care | Payer: BC Managed Care – PPO | Source: Ambulatory Visit | Attending: Interventional Cardiology | Admitting: Interventional Cardiology

## 2012-05-12 ENCOUNTER — Encounter (HOSPITAL_COMMUNITY)
Admission: RE | Admit: 2012-05-12 | Discharge: 2012-05-12 | Disposition: A | Discharge status: Home / Self Care | Payer: BC Managed Care – PPO | Source: Ambulatory Visit | Attending: Interventional Cardiology | Admitting: Interventional Cardiology

## 2012-05-12 ENCOUNTER — Encounter (HOSPITAL_COMMUNITY): Payer: BC Managed Care – PPO

## 2012-05-15 ENCOUNTER — Encounter (HOSPITAL_COMMUNITY)
Admission: RE | Admit: 2012-05-15 | Discharge: 2012-05-15 | Disposition: A | Discharge status: Home / Self Care | Payer: BC Managed Care – PPO | Source: Ambulatory Visit | Attending: Interventional Cardiology | Admitting: Interventional Cardiology

## 2012-05-15 ENCOUNTER — Encounter (HOSPITAL_COMMUNITY): Payer: BC Managed Care – PPO

## 2012-05-17 ENCOUNTER — Encounter (HOSPITAL_COMMUNITY): Payer: BC Managed Care – PPO

## 2012-05-17 ENCOUNTER — Encounter (HOSPITAL_COMMUNITY)
Admission: RE | Admit: 2012-05-17 | Discharge: 2012-05-17 | Disposition: A | Discharge status: Home / Self Care | Payer: BC Managed Care – PPO | Source: Ambulatory Visit | Attending: Interventional Cardiology | Admitting: Interventional Cardiology

## 2012-05-17 DIAGNOSIS — Z7982 Long term (current) use of aspirin: Secondary | ICD-10-CM | POA: Insufficient documentation

## 2012-05-17 DIAGNOSIS — Z882 Allergy status to sulfonamides status: Secondary | ICD-10-CM | POA: Insufficient documentation

## 2012-05-17 DIAGNOSIS — I214 Non-ST elevation (NSTEMI) myocardial infarction: Secondary | ICD-10-CM | POA: Insufficient documentation

## 2012-05-17 DIAGNOSIS — K219 Gastro-esophageal reflux disease without esophagitis: Secondary | ICD-10-CM | POA: Insufficient documentation

## 2012-05-17 DIAGNOSIS — M129 Arthropathy, unspecified: Secondary | ICD-10-CM | POA: Insufficient documentation

## 2012-05-17 DIAGNOSIS — Z951 Presence of aortocoronary bypass graft: Secondary | ICD-10-CM | POA: Insufficient documentation

## 2012-05-17 DIAGNOSIS — I2584 Coronary atherosclerosis due to calcified coronary lesion: Secondary | ICD-10-CM | POA: Insufficient documentation

## 2012-05-17 DIAGNOSIS — Z885 Allergy status to narcotic agent status: Secondary | ICD-10-CM | POA: Insufficient documentation

## 2012-05-17 DIAGNOSIS — I10 Essential (primary) hypertension: Secondary | ICD-10-CM | POA: Insufficient documentation

## 2012-05-17 DIAGNOSIS — Z853 Personal history of malignant neoplasm of breast: Secondary | ICD-10-CM | POA: Insufficient documentation

## 2012-05-17 DIAGNOSIS — E785 Hyperlipidemia, unspecified: Secondary | ICD-10-CM | POA: Insufficient documentation

## 2012-05-17 DIAGNOSIS — I2510 Atherosclerotic heart disease of native coronary artery without angina pectoris: Secondary | ICD-10-CM | POA: Insufficient documentation

## 2012-05-17 DIAGNOSIS — G4730 Sleep apnea, unspecified: Secondary | ICD-10-CM | POA: Insufficient documentation

## 2012-05-17 DIAGNOSIS — Z5189 Encounter for other specified aftercare: Secondary | ICD-10-CM | POA: Insufficient documentation

## 2012-05-17 DIAGNOSIS — J45909 Unspecified asthma, uncomplicated: Secondary | ICD-10-CM | POA: Insufficient documentation

## 2012-05-17 DIAGNOSIS — Z91040 Latex allergy status: Secondary | ICD-10-CM | POA: Insufficient documentation

## 2012-05-19 ENCOUNTER — Encounter (HOSPITAL_COMMUNITY): Payer: BC Managed Care – PPO

## 2012-05-19 ENCOUNTER — Encounter (HOSPITAL_COMMUNITY)
Admission: RE | Admit: 2012-05-19 | Discharge: 2012-05-19 | Disposition: A | Discharge status: Home / Self Care | Payer: BC Managed Care – PPO | Source: Ambulatory Visit | Attending: Interventional Cardiology | Admitting: Interventional Cardiology

## 2012-05-22 ENCOUNTER — Encounter (HOSPITAL_COMMUNITY): Payer: BC Managed Care – PPO

## 2012-05-22 ENCOUNTER — Encounter (HOSPITAL_COMMUNITY)
Admission: RE | Admit: 2012-05-22 | Discharge: 2012-05-22 | Disposition: A | Discharge status: Home / Self Care | Payer: BC Managed Care – PPO | Source: Ambulatory Visit | Attending: Interventional Cardiology | Admitting: Interventional Cardiology

## 2012-05-24 ENCOUNTER — Encounter (HOSPITAL_COMMUNITY)
Admission: RE | Admit: 2012-05-24 | Discharge: 2012-05-24 | Disposition: A | Discharge status: Home / Self Care | Payer: BC Managed Care – PPO | Source: Ambulatory Visit | Attending: Interventional Cardiology | Admitting: Interventional Cardiology

## 2012-05-24 ENCOUNTER — Encounter (HOSPITAL_COMMUNITY): Payer: BC Managed Care – PPO

## 2012-05-25 ENCOUNTER — Other Ambulatory Visit: Payer: Self-pay | Admitting: Family Medicine

## 2012-05-26 ENCOUNTER — Encounter (HOSPITAL_COMMUNITY): Payer: BC Managed Care – PPO

## 2012-05-26 ENCOUNTER — Encounter (HOSPITAL_COMMUNITY)
Admission: RE | Admit: 2012-05-26 | Discharge: 2012-05-26 | Disposition: A | Discharge status: Home / Self Care | Payer: BC Managed Care – PPO | Source: Ambulatory Visit | Attending: Interventional Cardiology | Admitting: Interventional Cardiology

## 2012-05-26 ENCOUNTER — Other Ambulatory Visit: Payer: Self-pay | Admitting: Obstetrics & Gynecology

## 2012-05-26 DIAGNOSIS — Z1231 Encounter for screening mammogram for malignant neoplasm of breast: Secondary | ICD-10-CM

## 2012-05-29 ENCOUNTER — Encounter (HOSPITAL_COMMUNITY)
Admission: RE | Admit: 2012-05-29 | Discharge: 2012-05-29 | Disposition: A | Discharge status: Home / Self Care | Payer: BC Managed Care – PPO | Source: Ambulatory Visit | Attending: Interventional Cardiology | Admitting: Interventional Cardiology

## 2012-05-29 ENCOUNTER — Encounter (HOSPITAL_COMMUNITY): Payer: BC Managed Care – PPO

## 2012-05-31 ENCOUNTER — Encounter (HOSPITAL_COMMUNITY)
Admission: RE | Admit: 2012-05-31 | Discharge: 2012-05-31 | Disposition: A | Discharge status: Home / Self Care | Payer: BC Managed Care – PPO | Source: Ambulatory Visit | Attending: Interventional Cardiology | Admitting: Interventional Cardiology

## 2012-05-31 ENCOUNTER — Encounter (HOSPITAL_COMMUNITY): Payer: BC Managed Care – PPO

## 2012-05-31 NOTE — Progress Notes (Signed)
[employer] today. Carolyn Fleming plans to continue exercise on her own via Zumb[employer] classes

## 2012-06-02 ENCOUNTER — Encounter (HOSPITAL_COMMUNITY): Payer: BC Managed Care – PPO

## 2012-06-05 ENCOUNTER — Encounter (HOSPITAL_COMMUNITY): Payer: BC Managed Care – PPO

## 2012-06-07 ENCOUNTER — Encounter (HOSPITAL_COMMUNITY): Payer: BC Managed Care – PPO

## 2012-06-09 ENCOUNTER — Encounter (HOSPITAL_COMMUNITY): Payer: BC Managed Care – PPO

## 2012-06-22 ENCOUNTER — Ambulatory Visit
Admission: RE | Admit: 2012-06-22 | Discharge: 2012-06-22 | Disposition: A | Discharge status: Home / Self Care | Payer: BC Managed Care – PPO | Source: Ambulatory Visit | Attending: Obstetrics & Gynecology | Admitting: Obstetrics & Gynecology

## 2012-06-22 ENCOUNTER — Ambulatory Visit: Payer: BC Managed Care – PPO

## 2012-06-22 DIAGNOSIS — Z1231 Encounter for screening mammogram for malignant neoplasm of breast: Secondary | ICD-10-CM

## 2012-08-04 ENCOUNTER — Ambulatory Visit (INDEPENDENT_AMBULATORY_CARE_PROVIDER_SITE_OTHER): Payer: Self-pay | Admitting: Surgery

## 2012-08-31 ENCOUNTER — Encounter (INDEPENDENT_AMBULATORY_CARE_PROVIDER_SITE_OTHER): Payer: Self-pay | Admitting: Surgery

## 2012-08-31 ENCOUNTER — Ambulatory Visit (INDEPENDENT_AMBULATORY_CARE_PROVIDER_SITE_OTHER): Payer: BC Managed Care – PPO | Admitting: Surgery

## 2012-08-31 VITALS — BP 122/74 | HR 72 | Temp 96.8°F | Resp 16 | Ht 62.0 in | Wt 179.8 lb

## 2012-08-31 DIAGNOSIS — C50919 Malignant neoplasm of unspecified site of unspecified female breast: Secondary | ICD-10-CM

## 2012-08-31 NOTE — Progress Notes (Signed)
[facility]  Ovidio Kin, MD,  FACS [address] [place]  [address] [place], [place]    [address] Phone:  [phone] FAX:  [phone]  ASSESSMENT AND PLAN: 1.  Right breast cancer  10 o'clock (UOQ) - Treatment in [place]  Doing well,  Disease free  See me back in one year  2.  She had a CABG x 2 by Dr. Barry Dienes on 01/24/2012  Dr. Mendel Ryder is her cardiologist.  She had a dramatic story about her heart disease this past summer and she is doing well now.  2.  OSA - on CPAP - doing well with this 3.  Glaucoma -   sees Dr. Dione Booze 4.  Colonoscopy by Dr. Matthias Hughs - Feb 2011.  HISTORY OF PRESENT ILLNESS: Chief Complaint  Patient presents with  . Follow-up    LTF-br reck   Carolyn Fleming is a 62 y.o. (DOB: 03-28-1951)  AA female who is a patient of WOLTERS,SHARON A, MD and comes to me today for follow up of right breast cancer.  Doing well from breast standpoint.  She spent most of the time entertaining me about the story of her heart disease and the urgent CABG she got this past summer.  Fortunately, she is doing very well and the final results are good.  She was on the way to her brother's funeral when her symptoms began and she had a neighbor who took her to the ER.  PHYSICAL EXAM: BP 122/74  Pulse 72  Temp 96.8 F (36 C) (Temporal)  Resp 16  Ht 5\' 2"  (1.575 m)  Wt 179 lb 12.8 oz (81.557 kg)  BMI 32.89 kg/m2  HEENT:  Pupils equal.  Dentition good.   NECK:  Supple.  No thyroid mass. LYMPH NODES:  No cervical, supraclavicular, or axillary adenopathy. Chest - Well healed median sternotomy scar. BREASTS -  RIGHT:  No palpable mass or nodule.  No nipple discharge.  Scar at 10 o'clock okay.   LEFT:  No palpable mass or nodule.  No nipple discharge. UPPER EXTREMITIES:  No evidence of lymphedema.  DATA REVIEWED: Mammogram - Nov 2012 - okay

## 2012-12-28 ENCOUNTER — Emergency Department (HOSPITAL_COMMUNITY): Payer: BC Managed Care – PPO

## 2012-12-28 ENCOUNTER — Encounter (HOSPITAL_COMMUNITY): Payer: Self-pay | Admitting: Emergency Medicine

## 2012-12-28 ENCOUNTER — Emergency Department (HOSPITAL_COMMUNITY)
Admission: EM | Admit: 2012-12-28 | Discharge: 2012-12-28 | Disposition: A | Discharge status: Home / Self Care | Payer: BC Managed Care – PPO | Attending: Emergency Medicine | Admitting: Emergency Medicine

## 2012-12-28 DIAGNOSIS — R209 Unspecified disturbances of skin sensation: Secondary | ICD-10-CM | POA: Insufficient documentation

## 2012-12-28 DIAGNOSIS — Z7982 Long term (current) use of aspirin: Secondary | ICD-10-CM | POA: Insufficient documentation

## 2012-12-28 DIAGNOSIS — I10 Essential (primary) hypertension: Secondary | ICD-10-CM | POA: Insufficient documentation

## 2012-12-28 DIAGNOSIS — Z951 Presence of aortocoronary bypass graft: Secondary | ICD-10-CM | POA: Insufficient documentation

## 2012-12-28 DIAGNOSIS — M5412 Radiculopathy, cervical region: Secondary | ICD-10-CM

## 2012-12-28 DIAGNOSIS — E669 Obesity, unspecified: Secondary | ICD-10-CM | POA: Insufficient documentation

## 2012-12-28 DIAGNOSIS — Z79899 Other long term (current) drug therapy: Secondary | ICD-10-CM | POA: Insufficient documentation

## 2012-12-28 DIAGNOSIS — Z853 Personal history of malignant neoplasm of breast: Secondary | ICD-10-CM | POA: Insufficient documentation

## 2012-12-28 DIAGNOSIS — J45909 Unspecified asthma, uncomplicated: Secondary | ICD-10-CM | POA: Insufficient documentation

## 2012-12-28 DIAGNOSIS — M129 Arthropathy, unspecified: Secondary | ICD-10-CM | POA: Insufficient documentation

## 2012-12-28 DIAGNOSIS — Z8719 Personal history of other diseases of the digestive system: Secondary | ICD-10-CM | POA: Insufficient documentation

## 2012-12-28 DIAGNOSIS — R079 Chest pain, unspecified: Secondary | ICD-10-CM | POA: Insufficient documentation

## 2012-12-28 DIAGNOSIS — G4730 Sleep apnea, unspecified: Secondary | ICD-10-CM | POA: Insufficient documentation

## 2012-12-28 DIAGNOSIS — Z91040 Latex allergy status: Secondary | ICD-10-CM | POA: Insufficient documentation

## 2012-12-28 DIAGNOSIS — E785 Hyperlipidemia, unspecified: Secondary | ICD-10-CM | POA: Insufficient documentation

## 2012-12-28 MED ORDER — PREDNISONE 20 MG PO TABS: ORAL_TABLET | ORAL | Status: DC

## 2012-12-28 MED ORDER — HYDROCODONE-ACETAMINOPHEN 5-325 MG PO TABS: 1.0000 | ORAL_TABLET | Freq: Four times a day (QID) | ORAL | Status: DC | PRN

## 2012-12-28 NOTE — ED Provider Notes (Signed)
History     CSN: [id number]  Arrival date & time 12/28/12  5621   First MD Initiated Contact with Patient 12/28/12 0830      Chief Complaint  Patient presents with  . L arm pain     (Consider location/radiation/quality/duration/timing/severity/associated sxs/prior treatment) Patient is a 62 y.o. female presenting with neurologic complaint. The history is provided by the patient and medical records. No language interpreter was used.  Neurologic Problem The primary symptoms include paresthesias. Primary symptoms do not include fever. Primary symptoms comment: Patient noted beyond pain in her left shoulder that goes into her left arm and fingers. This started yesterday. There was no injury.. The symptoms began yesterday. The symptoms are waxing and waning. The neurological symptoms are focal. Context: No injury or other apparent cause for the pain.  Paresthesias began greater than 24 hours ago. The paresthesias are waxing and waning. The paresthesias are described as tingling. Affected locations include the: left shoulder, left upper arm and left forearm.  Associated medical issues comments: She has known coronary artery disease with a prior CABG last year.. Workup history includes cardiac workup.    Past Medical History  Diagnosis Date  . Asthma   . Arthritis   . Sleep apnea     uses cpap  . Breast cancer   . GERD (gastroesophageal reflux disease)   . Hyperlipidemia   . S/P CABG x 2 01/24/2012    Emergency LIMA to LAD, SVG to OM1, EVH via right thigh  . Obesity (BMI 35.0-39.9 without comorbidity) 01/25/2012  . Hypertension 01/25/2012    Past Surgical History  Procedure Laterality Date  . Breast lumpectomy  1997    breast cancer  . Tubal ligation    . Abdominal hysterectomy    . Tonsillectomy    . Coronary artery bypass graft  01/24/2012    Procedure: CORONARY ARTERY BYPASS GRAFTING (CABG);  Surgeon: Purcell Nails, MD;  Location: [facility] OR;  Service: Open Heart Surgery;   Laterality: N/A;  Coronary Artery bypass Graft times two utilizing the left internal mammary artery and the right greater saphenous vein harvested endoscopically,  Transesophageal Echocardiogram    Family History  Problem Relation Age of Onset  . Cancer Mother     Colon  . Cancer Brother     Lung    History  Substance Use Topics  . Smoking status: Never Smoker   . Smokeless tobacco: Never Used  . Alcohol Use: No    OB History   Grav Para Term Preterm Abortions TAB SAB Ect Mult Living                  Review of Systems  Constitutional: Negative for fever and chills.  HENT: Negative.   Eyes: Negative.   Respiratory: Negative.   Cardiovascular: Positive for chest pain.  Gastrointestinal: Negative.   Genitourinary: Negative.   Musculoskeletal: Negative.   Skin: Negative.   Neurological: Positive for paresthesias.       Left shoulder and arm pain and tingling in the left hand.  Psychiatric/Behavioral: Negative.     Allergies  Codeine; Latex; and Sulfonamide derivatives  Home Medications   Current Outpatient Rx  Name  Route  Sig  Dispense  Refill  . amLODipine (NORVASC) 5 MG tablet               . aspirin 325 MG tablet   Oral   Take 325 mg by mouth daily.         Marland Kitchen  atorvastatin (LIPITOR) 10 MG tablet   Oral   Take 10 mg by mouth 2 (two) times a week. On Mondays and Fridays         . Betaxolol HCl (BETOPTIC-S OP)   Ophthalmic   Apply 1 drop to eye 2 (two) times daily.          . Bimatoprost (LUMIGAN OP)   Ophthalmic   Apply 1 drop to eye every evening.           . dorzolamide (TRUSOPT) 2 % ophthalmic solution   Both Eyes   Place 1 drop into both eyes 3 (three) times daily.         Marland Kitchen losartan (COZAAR) 50 MG tablet   Oral   Take 50 mg by mouth daily.         . mometasone (NASONEX) 50 MCG/ACT nasal spray   Nasal   Place 2 sprays into the nose 2 (two) times daily.           BP 133/81  Pulse 73  Temp(Src) 97.6 F (36.4 C) (Oral)   Resp 16  SpO2 100%  Physical Exam  Nursing note and vitals reviewed. Constitutional: She is oriented to person, place, and time. She appears well-developed and well-nourished. No distress.  HENT:  Head: Normocephalic and atraumatic.  Right Ear: External ear normal.  Left Ear: External ear normal.  Mouth/Throat: Oropharynx is clear and moist.  Eyes: Conjunctivae and EOM are normal. Pupils are equal, round, and reactive to light. No scleral icterus.  Neck: Normal range of motion. Neck supple.  No bony deformity or point tenderness in her neck.   Cardiovascular: Normal rate, regular rhythm and normal heart sounds.   Pulmonary/Chest: Effort normal and breath sounds normal.  Abdominal: Soft. Bowel sounds are normal.  Musculoskeletal: Normal range of motion. She exhibits no edema and no tenderness.  Neurological: She is alert and oriented to person, place, and time.  No sensory or motor deficit.  Skin: Skin is warm and dry.  Psychiatric: She has a normal mood and affect. Her behavior is normal.    ED Course  Procedures (including critical care time)  8:32 AM  Date: 12/28/2012  Rate: 77  Rhythm: normal sinus rhythm  QRS Axis: normal  Intervals: normal  ST/T Wave abnormalities: nonspecific T wave changes  Conduction Disutrbances:none  Narrative Interpretation: Abnormal EKG  Old EKG Reviewed: changes noted--EKG done 01/23/2012 suggested prior anterior myocardial infarction.  10:05 AM Pt was seen and had physical exam.  EKG was nonacute.  Cervical spine x-rays were ordered.  Results for orders placed during the hospital encounter of 01/23/12  MRSA PCR SCREENING      Result Value Range   MRSA by PCR NEGATIVE  NEGATIVE  CBC      Result Value Range   WBC 15.4 (*) 4.0 - 10.5 K/uL   RBC 5.34 (*) 3.87 - 5.11 MIL/uL   Hemoglobin 15.4 (*) 12.0 - 15.0 g/dL   HCT 16.1  09.6 - 04.5 %   MCV 83.3  78.0 - 100.0 fL   MCH 28.8  26.0 - 34.0 pg   MCHC 34.6  30.0 - 36.0 g/dL   RDW 40.9  81.1 -  91.4 %   Platelets 292  150 - 400 K/uL  DIFFERENTIAL      Result Value Range   Neutrophils Relative % 87 (*) 43 - 77 %   Neutro Abs 13.4 (*) 1.7 - 7.7 K/uL   Lymphocytes Relative 8 (*) 12 -  46 %   Lymphs Abs 1.3  0.7 - 4.0 K/uL   Monocytes Relative 5  3 - 12 %   Monocytes Absolute 0.7  0.1 - 1.0 K/uL   Eosinophils Relative 0  0 - 5 %   Eosinophils Absolute 0.0  0.0 - 0.7 K/uL   Basophils Relative 0  0 - 1 %   Basophils Absolute 0.0  0.0 - 0.1 K/uL  BASIC METABOLIC PANEL      Result Value Range   Sodium 138  135 - 145 mEq/L   Potassium 4.4  3.5 - 5.1 mEq/L   Chloride 101  96 - 112 mEq/L   CO2 23  19 - 32 mEq/L   Glucose, Bld 126 (*) 70 - 99 mg/dL   BUN 17  6 - 23 mg/dL   Creatinine, Ser 9.56  0.50 - 1.10 mg/dL   Calcium 21.3  8.4 - 08.6 mg/dL   GFR calc non Af Amer 60 (*) >90 mL/min   GFR calc Af Amer 70 (*) >90 mL/min  PROTIME-INR      Result Value Range   Prothrombin Time 13.0  11.6 - 15.2 seconds   INR 0.96  0.00 - 1.49  D-DIMER, QUANTITATIVE      Result Value Range   D-Dimer, Quant 0.29  0.00 - 0.48 ug/mL-FEU  PRO B NATRIURETIC PEPTIDE      Result Value Range   Pro B Natriuretic peptide (BNP) 1559.0 (*) 0 - 125 pg/mL  CARDIAC PANEL(CRET KIN+CKTOT+MB+TROPI)      Result Value Range   Total CK 1123 (*) 7 - 177 U/L   CK, MB 151.9 (*) 0.3 - 4.0 ng/mL   Troponin I >25.00 (*) <0.30 ng/mL   Relative Index 13.5 (*) 0.0 - 2.5  CARDIAC PANEL(CRET KIN+CKTOT+MB+TROPI)      Result Value Range   Total CK 1162 (*) 7 - 177 U/L   CK, MB 156.3 (*) 0.3 - 4.0 ng/mL   Troponin I >25.00 (*) <0.30 ng/mL   Relative Index 13.5 (*) 0.0 - 2.5  CARDIAC PANEL(CRET KIN+CKTOT+MB+TROPI)      Result Value Range   Total CK 878 (*) 7 - 177 U/L   CK, MB 101.8 (*) 0.3 - 4.0 ng/mL   Troponin I >20.00 (*) <0.30 ng/mL   Relative Index 11.6 (*) 0.0 - 2.5  TSH      Result Value Range   TSH 1.353  0.350 - 4.500 uIU/mL  MAGNESIUM      Result Value Range   Magnesium 1.7  1.5 - 2.5 mg/dL  HEMOGLOBIN  V7Q      Result Value Range   Hemoglobin A1C 6.3 (*) <5.7 %   Mean Plasma Glucose 134 (*) <117 mg/dL  CBC      Result Value Range   WBC 16.5 (*) 4.0 - 10.5 K/uL   RBC 5.01  3.87 - 5.11 MIL/uL   Hemoglobin 14.3  12.0 - 15.0 g/dL   HCT 46.9  62.9 - 52.8 %   MCV 83.2  78.0 - 100.0 fL   MCH 28.5  26.0 - 34.0 pg   MCHC 34.3  30.0 - 36.0 g/dL   RDW 41.3  24.4 - 01.0 %   Platelets 254  150 - 400 K/uL  BASIC METABOLIC PANEL      Result Value Range   Sodium 142  135 - 145 mEq/L   Potassium 3.6  3.5 - 5.1 mEq/L   Chloride 107  96 - 112 mEq/L  CO2 21  19 - 32 mEq/L   Glucose, Bld 123 (*) 70 - 99 mg/dL   BUN 16  6 - 23 mg/dL   Creatinine, Ser 1.61  0.50 - 1.10 mg/dL   Calcium 9.3  8.4 - 09.6 mg/dL   GFR calc non Af Amer 59 (*) >90 mL/min   GFR calc Af Amer 69 (*) >90 mL/min  LIPID PANEL      Result Value Range   Cholesterol 264 (*) 0 - 200 mg/dL   Triglycerides 045  <409 mg/dL   HDL 49  >81 mg/dL   Total CHOL/HDL Ratio 5.4     VLDL 27  0 - 40 mg/dL   LDL Cholesterol 191 (*) 0 - 99 mg/dL  HEPARIN LEVEL (UNFRACTIONATED)      Result Value Range   Heparin Unfractionated 0.65  0.30 - 0.70 IU/mL  HEPARIN LEVEL (UNFRACTIONATED)      Result Value Range   Heparin Unfractionated 0.69  0.30 - 0.70 IU/mL  CARDIAC PANEL(CRET KIN+CKTOT+MB+TROPI)      Result Value Range   Total CK 609 (*) 7 - 177 U/L   CK, MB 60.6 (*) 0.3 - 4.0 ng/mL   Troponin I >20.00 (*) <0.30 ng/mL   Relative Index 10.0 (*) 0.0 - 2.5  CBC      Result Value Range   WBC 8.3  4.0 - 10.5 K/uL   RBC 4.61  3.87 - 5.11 MIL/uL   Hemoglobin 12.9  12.0 - 15.0 g/dL   HCT 47.8  29.5 - 62.1 %   MCV 83.9  78.0 - 100.0 fL   MCH 28.0  26.0 - 34.0 pg   MCHC 33.3  30.0 - 36.0 g/dL   RDW 30.8  65.7 - 84.6 %   Platelets 197  150 - 400 K/uL  COMPREHENSIVE METABOLIC PANEL      Result Value Range   Sodium 136  135 - 145 mEq/L   Potassium 3.4 (*) 3.5 - 5.1 mEq/L   Chloride 102  96 - 112 mEq/L   CO2 24  19 - 32 mEq/L   Glucose, Bld  114 (*) 70 - 99 mg/dL   BUN 18  6 - 23 mg/dL   Creatinine, Ser 9.62 (*) 0.50 - 1.10 mg/dL   Calcium 8.4  8.4 - 95.2 mg/dL   Total Protein 6.2  6.0 - 8.3 g/dL   Albumin 3.1 (*) 3.5 - 5.2 g/dL   AST 99 (*) 0 - 37 U/L   ALT 27  0 - 35 U/L   Alkaline Phosphatase 63  39 - 117 U/L   Total Bilirubin 0.3  0.3 - 1.2 mg/dL   GFR calc non Af Amer 50 (*) >90 mL/min   GFR calc Af Amer 58 (*) >90 mL/min  HEMOGLOBIN A1C      Result Value Range   Hemoglobin A1C 6.4 (*) <5.7 %   Mean Plasma Glucose 137 (*) <117 mg/dL  PROTIME-INR      Result Value Range   Prothrombin Time 14.4  11.6 - 15.2 seconds   INR 1.10  0.00 - 1.49  APTT      Result Value Range   aPTT 54 (*) 24 - 37 seconds  LIPID PANEL      Result Value Range   Cholesterol 226 (*) 0 - 200 mg/dL   Triglycerides 841 (*) <150 mg/dL   HDL 41  >32 mg/dL   Total CHOL/HDL Ratio 5.5     VLDL 38  0 -  40 mg/dL   LDL Cholesterol 161 (*) 0 - 99 mg/dL  PLATELET COUNT      Result Value Range   Platelets 146 (*) 150 - 400 K/uL  HEMOGLOBIN AND HEMATOCRIT, BLOOD      Result Value Range   Hemoglobin 7.9 (*) 12.0 - 15.0 g/dL   HCT 09.6 (*) 04.5 - 40.9 %  CBC      Result Value Range   WBC 8.5  4.0 - 10.5 K/uL   RBC 3.63 (*) 3.87 - 5.11 MIL/uL   Hemoglobin 10.0 (*) 12.0 - 15.0 g/dL   HCT 81.1 (*) 91.4 - 78.2 %   MCV 82.6  78.0 - 100.0 fL   MCH 27.5  26.0 - 34.0 pg   MCHC 33.3  30.0 - 36.0 g/dL   RDW 95.6  21.3 - 08.6 %   Platelets 140 (*) 150 - 400 K/uL  BASIC METABOLIC PANEL      Result Value Range   Sodium 137  135 - 145 mEq/L   Potassium 4.0  3.5 - 5.1 mEq/L   Chloride 105  96 - 112 mEq/L   CO2 23  19 - 32 mEq/L   Glucose, Bld 127 (*) 70 - 99 mg/dL   BUN 16  6 - 23 mg/dL   Creatinine, Ser 5.78  0.50 - 1.10 mg/dL   Calcium 8.3 (*) 8.4 - 10.5 mg/dL   GFR calc non Af Amer 60 (*) >90 mL/min   GFR calc Af Amer 70 (*) >90 mL/min  CBC      Result Value Range   WBC 13.4 (*) 4.0 - 10.5 K/uL   RBC 3.87  3.87 - 5.11 MIL/uL   Hemoglobin 10.6  (*) 12.0 - 15.0 g/dL   HCT 46.9 (*) 62.9 - 52.8 %   MCV 83.2  78.0 - 100.0 fL   MCH 27.4  26.0 - 34.0 pg   MCHC 32.9  30.0 - 36.0 g/dL   RDW 41.3  24.4 - 01.0 %   Platelets 177  150 - 400 K/uL  PROTIME-INR      Result Value Range   Prothrombin Time 17.7 (*) 11.6 - 15.2 seconds   INR 1.43  0.00 - 1.49  APTT      Result Value Range   aPTT 26  24 - 37 seconds  MAGNESIUM      Result Value Range   Magnesium 2.7 (*) 1.5 - 2.5 mg/dL  GLUCOSE, CAPILLARY      Result Value Range   Glucose-Capillary 111 (*) 70 - 99 mg/dL  GLUCOSE, CAPILLARY      Result Value Range   Glucose-Capillary 126 (*) 70 - 99 mg/dL  GLUCOSE, CAPILLARY      Result Value Range   Glucose-Capillary 114 (*) 70 - 99 mg/dL  GLUCOSE, CAPILLARY      Result Value Range   Glucose-Capillary 114 (*) 70 - 99 mg/dL  GLUCOSE, CAPILLARY      Result Value Range   Glucose-Capillary 114 (*) 70 - 99 mg/dL  GLUCOSE, CAPILLARY      Result Value Range   Glucose-Capillary 122 (*) 70 - 99 mg/dL  GLUCOSE, CAPILLARY      Result Value Range   Glucose-Capillary 129 (*) 70 - 99 mg/dL  GLUCOSE, CAPILLARY      Result Value Range   Glucose-Capillary 129 (*) 70 - 99 mg/dL  GLUCOSE, CAPILLARY      Result Value Range   Glucose-Capillary 101 (*) 70 - 99 mg/dL  GLUCOSE, CAPILLARY      Result Value Range   Glucose-Capillary 97  70 - 99 mg/dL  GLUCOSE, CAPILLARY      Result Value Range   Glucose-Capillary 97  70 - 99 mg/dL   Comment 1 Notify RN     Comment 2 Documented in Chart    GLUCOSE, CAPILLARY      Result Value Range   Glucose-Capillary 130 (*) 70 - 99 mg/dL   Comment 1 Documented in Chart     Comment 2 Notify RN    GLUCOSE, CAPILLARY      Result Value Range   Glucose-Capillary 107 (*) 70 - 99 mg/dL   Comment 1 Notify RN     Comment 2 Documented in Chart    CBC      Result Value Range   WBC 11.3 (*) 4.0 - 10.5 K/uL   RBC 3.85 (*) 3.87 - 5.11 MIL/uL   Hemoglobin 10.8 (*) 12.0 - 15.0 g/dL   HCT 16.1 (*) 09.6 - 04.5 %   MCV  84.9  78.0 - 100.0 fL   MCH 28.1  26.0 - 34.0 pg   MCHC 33.0  30.0 - 36.0 g/dL   RDW 40.9  81.1 - 91.4 %   Platelets 173  150 - 400 K/uL  BASIC METABOLIC PANEL      Result Value Range   Sodium 139  135 - 145 mEq/L   Potassium 3.9  3.5 - 5.1 mEq/L   Chloride 104  96 - 112 mEq/L   CO2 25  19 - 32 mEq/L   Glucose, Bld 131 (*) 70 - 99 mg/dL   BUN 17  6 - 23 mg/dL   Creatinine, Ser 7.82 (*) 0.50 - 1.10 mg/dL   Calcium 8.5  8.4 - 95.6 mg/dL   GFR calc non Af Amer 47 (*) >90 mL/min   GFR calc Af Amer 54 (*) >90 mL/min  GLUCOSE, CAPILLARY      Result Value Range   Glucose-Capillary 94  70 - 99 mg/dL   Comment 1 Notify RN    GLUCOSE, CAPILLARY      Result Value Range   Glucose-Capillary 131 (*) 70 - 99 mg/dL  GLUCOSE, CAPILLARY      Result Value Range   Glucose-Capillary 105 (*) 70 - 99 mg/dL   Comment 1 Notify RN    BASIC METABOLIC PANEL      Result Value Range   Sodium 139  135 - 145 mEq/L   Potassium 4.2  3.5 - 5.1 mEq/L   Chloride 106  96 - 112 mEq/L   CO2 27  19 - 32 mEq/L   Glucose, Bld 112 (*) 70 - 99 mg/dL   BUN 21  6 - 23 mg/dL   Creatinine, Ser 2.13 (*) 0.50 - 1.10 mg/dL   Calcium 8.2 (*) 8.4 - 10.5 mg/dL   GFR calc non Af Amer 48 (*) >90 mL/min   GFR calc Af Amer 55 (*) >90 mL/min  CBC      Result Value Range   WBC 9.9  4.0 - 10.5 K/uL   RBC 3.59 (*) 3.87 - 5.11 MIL/uL   Hemoglobin 10.0 (*) 12.0 - 15.0 g/dL   HCT 08.6 (*) 57.8 - 46.9 %   MCV 85.2  78.0 - 100.0 fL   MCH 27.9  26.0 - 34.0 pg   MCHC 32.7  30.0 - 36.0 g/dL   RDW 62.9  52.8 - 41.3 %   Platelets 157  150 -  400 K/uL  GLUCOSE, CAPILLARY      Result Value Range   Glucose-Capillary 125 (*) 70 - 99 mg/dL  GLUCOSE, CAPILLARY      Result Value Range   Glucose-Capillary 101 (*) 70 - 99 mg/dL  GLUCOSE, CAPILLARY      Result Value Range   Glucose-Capillary 96  70 - 99 mg/dL  GLUCOSE, CAPILLARY      Result Value Range   Glucose-Capillary 91  70 - 99 mg/dL  GLUCOSE, CAPILLARY      Result Value Range    Glucose-Capillary 82  70 - 99 mg/dL   Comment 1 Documented in Chart     Comment 2 Notify RN    GLUCOSE, CAPILLARY      Result Value Range   Glucose-Capillary 141 (*) 70 - 99 mg/dL   Comment 1 Documented in Chart     Comment 2 Notify RN    GLUCOSE, CAPILLARY      Result Value Range   Glucose-Capillary 109 (*) 70 - 99 mg/dL   Comment 1 Documented in Chart     Comment 2 Notify RN    GLUCOSE, CAPILLARY      Result Value Range   Glucose-Capillary 95  70 - 99 mg/dL  GLUCOSE, CAPILLARY      Result Value Range   Glucose-Capillary 91  70 - 99 mg/dL  GLUCOSE, CAPILLARY      Result Value Range   Glucose-Capillary 119 (*) 70 - 99 mg/dL  GLUCOSE, CAPILLARY      Result Value Range   Glucose-Capillary 108 (*) 70 - 99 mg/dL  GLUCOSE, CAPILLARY      Result Value Range   Glucose-Capillary 86  70 - 99 mg/dL  POCT I-STAT TROPONIN I      Result Value Range   Troponin i, poc 3.28 (*) 0.00 - 0.08 ng/mL   Comment NOTIFIED PHYSICIAN     Comment 3           POCT I-STAT 3, BLOOD GAS (G3+)      Result Value Range   pH, Arterial 7.364  7.350 - 7.400   pCO2 arterial 42.4  35.0 - 45.0 mmHg   pO2, Arterial 97.0  80.0 - 100.0 mmHg   Bicarbonate 24.2 (*) 20.0 - 24.0 mEq/L   TCO2 25  0 - 100 mmol/L   O2 Saturation 97.0     Acid-base deficit 1.0  0.0 - 2.0 mmol/L   Sample type ARTERIAL    POCT ACTIVATED CLOTTING TIME      Result Value Range   Activated Clotting Time 138    POCT I-STAT 4, (NA,K, GLUC, HGB,HCT)      Result Value Range   Sodium 142  135 - 145 mEq/L   Potassium 3.5  3.5 - 5.1 mEq/L   Glucose, Bld 131 (*) 70 - 99 mg/dL   HCT 65.7  84.6 - 96.2 %   Hemoglobin 13.3  12.0 - 15.0 g/dL  POCT I-STAT 4, (NA,K, GLUC, HGB,HCT)      Result Value Range   Sodium 139  135 - 145 mEq/L   Potassium 3.8  3.5 - 5.1 mEq/L   Glucose, Bld 126 (*) 70 - 99 mg/dL   HCT 95.2 (*) 84.1 - 32.4 %   Hemoglobin 11.2 (*) 12.0 - 15.0 g/dL  POCT I-STAT 3, BLOOD GAS (G3+)      Result Value Range   pH, Arterial  7.410 (*) 7.350 - 7.400   pCO2 arterial 38.2  35.0 -  45.0 mmHg   pO2, Arterial 351.0 (*) 80.0 - 100.0 mmHg   Bicarbonate 24.2 (*) 20.0 - 24.0 mEq/L   TCO2 25  0 - 100 mmol/L   O2 Saturation 100.0     Sample type ARTERIAL    POCT I-STAT 4, (NA,K, GLUC, HGB,HCT)      Result Value Range   Sodium 134 (*) 135 - 145 mEq/L   Potassium 4.8  3.5 - 5.1 mEq/L   Glucose, Bld 105 (*) 70 - 99 mg/dL   HCT 29.5 (*) 28.4 - 13.2 %   Hemoglobin 7.8 (*) 12.0 - 15.0 g/dL  POCT I-STAT 4, (NA,K, GLUC, HGB,HCT)      Result Value Range   Sodium 136  135 - 145 mEq/L   Potassium 4.7  3.5 - 5.1 mEq/L   Glucose, Bld 117 (*) 70 - 99 mg/dL   HCT 44.0 (*) 10.2 - 72.5 %   Hemoglobin 7.8 (*) 12.0 - 15.0 g/dL  POCT I-STAT 4, (NA,K, GLUC, HGB,HCT)      Result Value Range   Sodium 138  135 - 145 mEq/L   Potassium 4.1  3.5 - 5.1 mEq/L   Glucose, Bld 142 (*) 70 - 99 mg/dL   HCT 36.6 (*) 44.0 - 34.7 %   Hemoglobin 8.2 (*) 12.0 - 15.0 g/dL  POCT I-STAT 4, (NA,K, GLUC, HGB,HCT)      Result Value Range   Sodium 138  135 - 145 mEq/L   Potassium 4.0  3.5 - 5.1 mEq/L   Glucose, Bld 124 (*) 70 - 99 mg/dL   HCT 42.5 (*) 95.6 - 38.7 %   Hemoglobin 11.9 (*) 12.0 - 15.0 g/dL  POCT ACTIVATED CLOTTING TIME      Result Value Range   Activated Clotting Time 83    POCT I-STAT 3, BLOOD GAS (G3+)      Result Value Range   pH, Arterial 7.407 (*) 7.350 - 7.400   pCO2 arterial 31.4 (*) 35.0 - 45.0 mmHg   pO2, Arterial 96.0  80.0 - 100.0 mmHg   Bicarbonate 20.0  20.0 - 24.0 mEq/L   TCO2 21  0 - 100 mmol/L   O2 Saturation 98.0     Acid-base deficit 4.0 (*) 0.0 - 2.0 mmol/L   Patient temperature 35.6 C     Collection site ARTERIAL LINE     Drawn by :MD     Sample type ARTERIAL    POCT I-STAT 3, BLOOD GAS (G3+)      Result Value Range   pH, Arterial 7.326 (*) 7.350 - 7.400   pCO2 arterial 40.0  35.0 - 45.0 mmHg   pO2, Arterial 107.0 (*) 80.0 - 100.0 mmHg   Bicarbonate 20.9  20.0 - 24.0 mEq/L   TCO2 22  0 - 100 mmol/L   O2  Saturation 98.0     Acid-base deficit 5.0 (*) 0.0 - 2.0 mmol/L   Patient temperature 37.0 C     Collection site ARTERIAL LINE     Drawn by Operator     Sample type ARTERIAL    POCT I-STAT 3, BLOOD GAS (G3+)      Result Value Range   pH, Arterial 7.347 (*) 7.350 - 7.400   pCO2 arterial 39.7  35.0 - 45.0 mmHg   pO2, Arterial 98.0  80.0 - 100.0 mmHg   Bicarbonate 21.8  20.0 - 24.0 mEq/L   TCO2 23  0 - 100 mmol/L   O2 Saturation 97.0     Acid-base  deficit 4.0 (*) 0.0 - 2.0 mmol/L   Patient temperature 36.9 C     Collection site ARTERIAL LINE     Drawn by Operator     Sample type ARTERIAL    TYPE AND SCREEN      Result Value Range   ABO/RH(D) O POS     Antibody Screen NEG     Sample Expiration 01/27/2012     Unit Number [id number]     Blood Component Type RBC LR PHER2     Unit division 00     Status of Unit REL FROM [facility]     Transfusion Status OK TO TRANSFUSE     Crossmatch Result Compatible     Unit Number [id number]     Blood Component Type RBC LR PHER2     Unit division 00     Status of Unit REL FROM [facility]     Transfusion Status OK TO TRANSFUSE     Crossmatch Result Compatible    PREPARE RBC (CROSSMATCH)      Result Value Range   Order Confirmation ORDER PROCESSED BY BLOOD BANK    ABO/RH      Result Value Range   ABO/RH(D) O POS     Dg Cervical Spine Complete  12/28/2012   *RADIOLOGY REPORT*  Clinical Data: Neck pain with left arm pain  CERVICAL SPINE - COMPLETE 4+ VIEW  Comparison: None  Findings: Mild anterior slip C4-5.  Disc degeneration at C4-5. Moderate disc degeneration and spurring C5-6.  Foraminal encroachment on the left at C5-6 may account for the patient's symptoms.  Negative for fracture.  IMPRESSION: Disc degeneration and spondylosis at C5-6 causing left foraminal encroachment.   Original Report Authenticated By: Janeece Riggers, M.D.    X-rays suggest that the cause of her pain is cervical radiculopathy.  Rx with prednisone to reduce swelling in her discs,  hydrocodone-acetaminophen q4h prn pain, F/U with Dr. Mila Palmer, her PCP, in appx 1 week.  1. Cervical radiculopathy at C6            Carleene Cooper III, MD 12/28/12 1108

## 2012-12-28 NOTE — ED Notes (Signed)
Patient states had open heart surgery 01/25/12.   Patient states no problems since.  Patient states she has been having L arm pain x 2 days and called into work.  Patient advised her supervisor made her come to ED - she was just going to see her regular doctor at 54.   Patient denies chest pain, shortness of breath and any other symptoms.   Patient states "theres nothing wrong with me, I'm just mad".

## 2013-01-15 ENCOUNTER — Other Ambulatory Visit: Payer: Self-pay | Admitting: Family Medicine

## 2013-01-15 ENCOUNTER — Ambulatory Visit
Admission: RE | Admit: 2013-01-15 | Discharge: 2013-01-15 | Disposition: A | Discharge status: Home / Self Care | Payer: BC Managed Care – PPO | Source: Ambulatory Visit | Attending: Family Medicine | Admitting: Family Medicine

## 2013-01-15 DIAGNOSIS — M5412 Radiculopathy, cervical region: Secondary | ICD-10-CM

## 2013-01-20 ENCOUNTER — Other Ambulatory Visit: Payer: BC Managed Care – PPO

## 2013-05-12 ENCOUNTER — Other Ambulatory Visit: Payer: Self-pay | Admitting: Interventional Cardiology

## 2013-05-12 DIAGNOSIS — Z79899 Other long term (current) drug therapy: Secondary | ICD-10-CM

## 2013-05-12 DIAGNOSIS — E785 Hyperlipidemia, unspecified: Secondary | ICD-10-CM

## 2013-05-22 ENCOUNTER — Telehealth: Payer: Self-pay | Admitting: Interventional Cardiology

## 2013-05-22 NOTE — Telephone Encounter (Signed)
New Problem  Pt calls throat is scratchy,, feels drainage// chest is tight// no pain///Believes that it is due to sinus// Please call to discuss relief options.

## 2013-05-23 ENCOUNTER — Other Ambulatory Visit: Payer: Self-pay

## 2013-05-23 DIAGNOSIS — Z853 Personal history of malignant neoplasm of breast: Secondary | ICD-10-CM

## 2013-05-23 DIAGNOSIS — Z9889 Other specified postprocedural states: Secondary | ICD-10-CM

## 2013-05-23 DIAGNOSIS — Z1231 Encounter for screening mammogram for malignant neoplasm of breast: Secondary | ICD-10-CM

## 2013-05-23 NOTE — Telephone Encounter (Signed)
lmom. Dr.Tvedt does not feel symptoms are cardiac related and pt should f/u with her pcp.

## 2013-05-28 ENCOUNTER — Other Ambulatory Visit: Payer: BC Managed Care – PPO

## 2013-05-28 ENCOUNTER — Other Ambulatory Visit (INDEPENDENT_AMBULATORY_CARE_PROVIDER_SITE_OTHER): Payer: BC Managed Care – PPO

## 2013-05-28 ENCOUNTER — Telehealth: Payer: Self-pay | Admitting: Interventional Cardiology

## 2013-05-28 DIAGNOSIS — E785 Hyperlipidemia, unspecified: Secondary | ICD-10-CM

## 2013-05-28 DIAGNOSIS — Z79899 Other long term (current) drug therapy: Secondary | ICD-10-CM

## 2013-05-28 LAB — LIPID PANEL
Cholesterol: 212 mg/dL — ABNORMAL HIGH (ref 0–200)
Total CHOL/HDL Ratio: 4

## 2013-05-28 LAB — ALT: ALT: 29 U/L (ref 0–35)

## 2013-05-28 NOTE — Telephone Encounter (Signed)
Discussed with patient face to face after she gave bloodwork.  She has been told she has ezcema.  She feels the wafers are also worsening her rash.  This is not a reaction that should be occurring with Metamucil.  She has multiple intolerances to lipid lowering medications.  Patient advised to stop wafers today, and in 1 week change to metamucil powder - mix 1 tablespoon with 8 ounces of water and drink this twice daily.  She will let me know if this causes the same problem.

## 2013-05-28 NOTE — Telephone Encounter (Signed)
New message  For Jeremy---pt gets a rash every time she eats the metamucil wafers.  Coming over today for lab---want to talk to [place] at that time.

## 2013-05-30 ENCOUNTER — Telehealth: Payer: Self-pay

## 2013-05-30 NOTE — Telephone Encounter (Signed)
Message copied by Jarvis Newcomer on Wed May 30, 2013  1:17 PM ------      Message from: Verdis Prime      Created: Wed May 30, 2013 12:59 PM       Repeat in 6 months Lipid panel with ALT. ------

## 2013-05-30 NOTE — Telephone Encounter (Signed)
lmom for pt to return call regarding labs

## 2013-05-31 ENCOUNTER — Other Ambulatory Visit: Payer: Self-pay

## 2013-05-31 ENCOUNTER — Telehealth: Payer: Self-pay

## 2013-05-31 DIAGNOSIS — E785 Hyperlipidemia, unspecified: Secondary | ICD-10-CM

## 2013-05-31 DIAGNOSIS — I2510 Atherosclerotic heart disease of native coronary artery without angina pectoris: Secondary | ICD-10-CM

## 2013-05-31 NOTE — Telephone Encounter (Signed)
pt given lab results.Not at target but the best we can due given medication intolerance. Continue diet and exercise.Liver test is normal.repeat 6 mo pt verbalized understanding

## 2013-05-31 NOTE — Telephone Encounter (Signed)
Message copied by Jarvis Newcomer on Thu May 31, 2013  9:12 AM ------      Message from: Verdis Prime      Created: Wed May 30, 2013 12:59 PM       Repeat in 6 months Lipid panel with ALT. ------

## 2013-06-01 ENCOUNTER — Other Ambulatory Visit: Payer: BC Managed Care – PPO

## 2013-06-06 ENCOUNTER — Telehealth: Payer: Self-pay | Admitting: *Deleted

## 2013-06-06 NOTE — Telephone Encounter (Signed)
Patient states she tried the metamucil powder for cholesterol and it made her nausiated, and almost made her vomit.  She wants to try to go back to the wafer again twice daily.  She stopped the wafer in the past as she felt it may be causing a rash, but now she is not positive that was the culprit.  She will call back if a rash develops.

## 2013-06-13 ENCOUNTER — Telehealth: Payer: Self-pay | Admitting: Interventional Cardiology

## 2013-06-13 ENCOUNTER — Other Ambulatory Visit: Payer: Self-pay | Admitting: Pharmacist

## 2013-06-13 NOTE — Telephone Encounter (Signed)
New Problem  Pt states that she has started taking the Metamucil Fiber Wafers as of Monday and the rash has returned.. She states that she cannot continue to take this medication.. Requests an alternative. Please advise

## 2013-06-13 NOTE — Telephone Encounter (Signed)
Patient with multiple intolerances to lipid lowering meds, now which includes metamucil (powder - nausea; wafters - rash). Other Intolerance Pravachol, Zocor, Lipitor (daily), Lescol, Crestor, Zetia (muscle aches), Niacin, Welchol, Evacetrapib (rash).  She is currently on atorvastatin 10 mg tiw (Monday, Wednesday, Friday).  She is advised to continue on this as we have exhausted other treatment options, and if a clinical trial she qualifies for becomes available, I will try to reach out to patient to enroll.

## 2013-06-25 ENCOUNTER — Ambulatory Visit
Admission: RE | Admit: 2013-06-25 | Discharge: 2013-06-25 | Disposition: A | Discharge status: Home / Self Care | Payer: BC Managed Care – PPO | Source: Ambulatory Visit

## 2013-06-25 DIAGNOSIS — Z1231 Encounter for screening mammogram for malignant neoplasm of breast: Secondary | ICD-10-CM

## 2013-06-25 DIAGNOSIS — Z853 Personal history of malignant neoplasm of breast: Secondary | ICD-10-CM

## 2013-06-25 DIAGNOSIS — Z9889 Other specified postprocedural states: Secondary | ICD-10-CM

## 2013-08-14 ENCOUNTER — Emergency Department (HOSPITAL_COMMUNITY): Payer: BC Managed Care – PPO

## 2013-08-14 ENCOUNTER — Emergency Department (HOSPITAL_COMMUNITY)
Admission: EM | Admit: 2013-08-14 | Discharge: 2013-08-14 | Disposition: A | Discharge status: Home / Self Care | Payer: BC Managed Care – PPO | Attending: Emergency Medicine | Admitting: Emergency Medicine

## 2013-08-14 ENCOUNTER — Encounter (HOSPITAL_COMMUNITY): Payer: Self-pay | Admitting: Emergency Medicine

## 2013-08-14 DIAGNOSIS — Z853 Personal history of malignant neoplasm of breast: Secondary | ICD-10-CM | POA: Insufficient documentation

## 2013-08-14 DIAGNOSIS — R110 Nausea: Secondary | ICD-10-CM | POA: Insufficient documentation

## 2013-08-14 DIAGNOSIS — J45909 Unspecified asthma, uncomplicated: Secondary | ICD-10-CM | POA: Insufficient documentation

## 2013-08-14 DIAGNOSIS — Z951 Presence of aortocoronary bypass graft: Secondary | ICD-10-CM | POA: Insufficient documentation

## 2013-08-14 DIAGNOSIS — Z8719 Personal history of other diseases of the digestive system: Secondary | ICD-10-CM | POA: Insufficient documentation

## 2013-08-14 DIAGNOSIS — I10 Essential (primary) hypertension: Secondary | ICD-10-CM | POA: Insufficient documentation

## 2013-08-14 DIAGNOSIS — E785 Hyperlipidemia, unspecified: Secondary | ICD-10-CM | POA: Insufficient documentation

## 2013-08-14 DIAGNOSIS — G4730 Sleep apnea, unspecified: Secondary | ICD-10-CM | POA: Insufficient documentation

## 2013-08-14 DIAGNOSIS — R42 Dizziness and giddiness: Secondary | ICD-10-CM | POA: Insufficient documentation

## 2013-08-14 DIAGNOSIS — H9209 Otalgia, unspecified ear: Secondary | ICD-10-CM | POA: Insufficient documentation

## 2013-08-14 DIAGNOSIS — R55 Syncope and collapse: Secondary | ICD-10-CM

## 2013-08-14 DIAGNOSIS — Z91040 Latex allergy status: Secondary | ICD-10-CM | POA: Insufficient documentation

## 2013-08-14 DIAGNOSIS — Z79899 Other long term (current) drug therapy: Secondary | ICD-10-CM | POA: Insufficient documentation

## 2013-08-14 DIAGNOSIS — Z7982 Long term (current) use of aspirin: Secondary | ICD-10-CM | POA: Insufficient documentation

## 2013-08-14 DIAGNOSIS — M129 Arthropathy, unspecified: Secondary | ICD-10-CM | POA: Insufficient documentation

## 2013-08-14 DIAGNOSIS — E669 Obesity, unspecified: Secondary | ICD-10-CM | POA: Insufficient documentation

## 2013-08-14 DIAGNOSIS — R197 Diarrhea, unspecified: Secondary | ICD-10-CM | POA: Insufficient documentation

## 2013-08-14 LAB — URINALYSIS W MICROSCOPIC + REFLEX CULTURE
Leukocytes, UA: NEGATIVE
Nitrite: NEGATIVE
Specific Gravity, Urine: 1.019 (ref 1.005–1.030)
Urobilinogen, UA: 1 mg/dL (ref 0.0–1.0)

## 2013-08-14 LAB — CBC
Hemoglobin: 14.7 g/dL (ref 12.0–15.0)
Platelets: 208 10*3/uL (ref 150–400)
RBC: 5.02 MIL/uL (ref 3.87–5.11)
WBC: 8.6 10*3/uL (ref 4.0–10.5)

## 2013-08-14 LAB — BASIC METABOLIC PANEL
CO2: 28 mEq/L (ref 19–32)
Chloride: 104 mEq/L (ref 96–112)
Sodium: 143 mEq/L (ref 137–147)

## 2013-08-14 LAB — POCT I-STAT TROPONIN I: Troponin i, poc: 0 ng/mL (ref 0.00–0.08)

## 2013-08-14 NOTE — ED Provider Notes (Signed)
CSN: [id number]     Arrival date & time 08/14/13  1326 History   First MD Initiated Contact with Patient 08/14/13 1506     Chief Complaint  Patient presents with  . Loss of Consciousness   (Consider location/radiation/quality/duration/timing/severity/associated sxs/prior Treatment) HPI Carolyn Fleming is a 62 y.o. female who presents to ED with complaint of a syncopal episode early this morning. Pt states she woke up around 7am, states first thing she did was take her mouth piece out from her cpap machine, and went to the bathroom to wash it, states began feeling "flushing to the face and dizziness." States next thing she woke up on the floor. States she is not sure how long syncopal episode lasted, but states she does not remember falling. States that she stayed on the floor for about 20 min due to dizziness and nausea. States once she felt better, she got up and has been feeling well for the rest of the day. States she did have several episodes of diarrhea today, and pain in right ear. She went to see her PCP who sent her here. Pt denies feeling current dizziness, no chest pain, no shortness of breath, no headache, no other complaints.   Past Medical History  Diagnosis Date  . Asthma   . Arthritis   . Sleep apnea     uses cpap  . Breast cancer   . GERD (gastroesophageal reflux disease)   . Hyperlipidemia   . S/P CABG x 2 01/24/2012    Emergency LIMA to LAD, SVG to OM1, EVH via right thigh  . Obesity (BMI 35.0-39.9 without comorbidity) 01/25/2012  . Hypertension 01/25/2012   Past Surgical History  Procedure Laterality Date  . Breast lumpectomy  1997    breast cancer  . Tubal ligation    . Abdominal hysterectomy    . Tonsillectomy    . Coronary artery bypass graft  01/24/2012    Procedure: CORONARY ARTERY BYPASS GRAFTING (CABG);  Surgeon: Purcell Nails, MD;  Location: [facility] OR;  Service: Open Heart Surgery;  Laterality: N/A;  Coronary Artery bypass Graft times two utilizing the left  internal mammary artery and the right greater saphenous vein harvested endoscopically,  Transesophageal Echocardiogram   Family History  Problem Relation Age of Onset  . Cancer Mother     Colon  . Cancer Brother     Lung   History  Substance Use Topics  . Smoking status: Never Smoker   . Smokeless tobacco: Never Used  . Alcohol Use: No   OB History   Grav Para Term Preterm Abortions TAB SAB Ect Mult Living                 Review of Systems  Constitutional: Negative for fever, chills and fatigue.  HENT: Positive for ear pain.   Eyes: Negative for photophobia, pain and visual disturbance.  Respiratory: Negative for cough, chest tightness and shortness of breath.   Cardiovascular: Negative for chest pain, palpitations and leg swelling.  Gastrointestinal: Negative for nausea, vomiting, abdominal pain and diarrhea.  Genitourinary: Negative for dysuria, flank pain, vaginal bleeding, vaginal discharge, vaginal pain and pelvic pain.  Musculoskeletal: Negative for arthralgias, myalgias, neck pain and neck stiffness.  Skin: Negative for rash.  Neurological: Positive for dizziness, syncope and light-headedness. Negative for facial asymmetry, weakness and headaches.  All other systems reviewed and are negative.    Allergies  Codeine; Latex; and Sulfonamide derivatives  Home Medications   Current Outpatient Rx  Name  Route  Sig  Dispense  Refill  . amLODipine (NORVASC) 5 MG tablet   Oral   Take 5 mg by mouth daily.          Marland Kitchen aspirin EC 81 MG tablet   Oral   Take 81 mg by mouth daily.         Marland Kitchen atorvastatin (LIPITOR) 10 MG tablet   Oral   Take 10 mg by mouth 3 (three) times a week. On Mondays and Wednesday and Fridays         . Betaxolol HCl (BETOPTIC-S OP)   Ophthalmic   Apply 1 drop to eye 2 (two) times daily.          . bimatoprost (LUMIGAN) 0.01 % SOLN   Both Eyes   Place 1 drop into both eyes at bedtime.         . brimonidine (ALPHAGAN) 0.15 %  ophthalmic solution   Both Eyes   Place 1 drop into both eyes 2 (two) times daily.         . dorzolamide (TRUSOPT) 2 % ophthalmic solution   Both Eyes   Place 1 drop into both eyes 3 (three) times daily.         Marland Kitchen losartan-hydrochlorothiazide (HYZAAR) 100-12.5 MG per tablet   Oral   Take 1 tablet by mouth daily.          BP 112/61  Pulse 67  Resp 18  SpO2 100% Physical Exam  Nursing note and vitals reviewed. Constitutional: She is oriented to person, place, and time. She appears well-developed and well-nourished. No distress.  HENT:  Head: Normocephalic.  Right Ear: External ear normal.  Left Ear: External ear normal.  Nose: Nose normal.  TMs normal bilaterally  Eyes: Conjunctivae and EOM are normal. Pupils are equal, round, and reactive to light.  Neck: Normal range of motion. Neck supple.  Cardiovascular: Normal rate, regular rhythm and normal heart sounds.   Pulmonary/Chest: Effort normal and breath sounds normal. No respiratory distress. She has no wheezes. She has no rales.  Abdominal: Soft. Bowel sounds are normal. She exhibits no distension. There is no tenderness. There is no rebound.  Musculoskeletal: She exhibits no edema.  Neurological: She is alert and oriented to person, place, and time.  5/5 and equal upper and lower extremity strength bilaterally. Equal grip strength bilaterally. Normal finger to nose and heel to shin. No pronator drift.   Skin: Skin is warm and dry.  Psychiatric: She has a normal mood and affect. Her behavior is normal.    ED Course  Procedures (including critical care time) Labs Review Labs Reviewed  BASIC METABOLIC PANEL - Abnormal; Notable for the following:    Potassium 3.4 (*)    BUN 24 (*)    Creatinine, Ser 1.36 (*)    GFR calc non Af Amer 41 (*)    GFR calc Af Amer 47 (*)    All other components within normal limits  CBC  URINALYSIS W MICROSCOPIC + REFLEX CULTURE  POCT I-STAT TROPONIN I   Imaging Review Dg Chest 2  View  08/14/2013   CLINICAL DATA:  Loss of consciousness.  Previous CABG.  EXAM: CHEST  2 VIEW  COMPARISON:  02/21/2012  FINDINGS: There has been previous median sternotomy and CABG. The aorta is unfolded. Heart size is at the upper limits of normal. There may be pulmonary venous hypertension but there is no frank edema. No infiltrate or collapse. No significant bony finding.  IMPRESSION: Previous  CABG. Borderline cardiomegaly. Question pulmonary venous hypertension but no edema.   Electronically Signed   By: Paulina Fusi M.D.   On: 08/14/2013 15:31    EKG Interpretation    Date/Time:  Tuesday August 14 2013 13:30:55 EST Ventricular Rate:  83 PR Interval:  142 QRS Duration: 76 QT Interval:  384 QTC Calculation: 451 R Axis:   39 Text Interpretation:  Normal sinus rhythm Nonspecific T wave abnormality Abnormal ECG since last tracing no significant change Confirmed by WENTZ  MD, ELLIOTT ([id number]) on 08/14/2013 4:30:30 PM            MDM   1. Syncope     Patient with a syncopal episode after standing up out of bed this morning. She states she normally sits up in bed for a few minutes but today she was in a hurry to wash her mouth appliance for her CPAP machine and states she got up too quickly. Given her medical history it was concerning to her primary care Dr. and she was sent here for further evaluation. In emergency department her EKG is unchanged, her vital signs are all within normal, she's not orthostatic, chest x-ray showing pulmonary venous hypertension without any edema. Her oxygen saturations 100% she denies any current chest pain or shortness of breath. She denies any dyspnea on exertion. She denies any dizziness. Her labs are all unremarkable. Patient is adamant about going home. She states she feels fine and really would like to be discharged home with outpatient followup. At this time given she is asymptomatic with no significant findings and labs were vital signs or examination,  she is safe to be discharged home. She's instructed to return if she has any more syncopal episodes or followup with her primary care Dr. since she's able. Patient voiced understanding.  Filed Vitals:   08/14/13 1558 08/14/13 1559 08/14/13 1600 08/14/13 1700  BP: 108/63 113/66 108/75 111/81  Pulse: 68 65 66 59  Resp: 18 18 20 17   SpO2: 99% 99% 100% 100%       Myriam Jacobson Collyn Selk, PA-C 08/15/13 0149

## 2013-08-14 NOTE — ED Notes (Signed)
Tatyana, PA at the bedside  

## 2013-08-14 NOTE — ED Notes (Signed)
Pt in c/o syncopal episode this morning, states she was standing at her bathroom sink and passed out, woke up on the floor, states when she woke up she felt nauseous and weak, states she laid in the floor for approx 20 min the got up and went to her bed, later had multiple episodes of diarrhea, states she went to see her MD this morning who referred her to the ED for follow up

## 2013-08-15 NOTE — ED Provider Notes (Signed)
Medical screening examination/treatment/procedure(s) were performed by non-physician practitioner and as supervising physician I was immediately available for consultation/collaboration.  Kitt Minardi L Marlane Hirschmann, MD 08/15/13 1850 

## 2013-08-31 ENCOUNTER — Telehealth: Payer: Self-pay | Admitting: Interventional Cardiology

## 2013-08-31 NOTE — Telephone Encounter (Signed)
New Message  Pt states that 2 weeks ago she blacked out// seen PCP who regulated her potassium. However, while at work the pt states that she felt a "whoozie"  feeling as if she was about to black out again. Made appt for pt with Dr. [name] for 09/06/2013 at 8:30am. Pt is requesting a call back from nurse.

## 2013-08-31 NOTE — Telephone Encounter (Signed)
returned pt call.ot sts that she had an episode of syncope 2 wks ago.pt sts that she f/u with her pcp who told her that her k+ was lo and she was dehydrated.she was encouraged to eat a banana daily and drink fluids.pt sts that her labs were rechecked and came back normal.pt sts  That today she felt whoozzie and thought she was going to have another episode.pt rqst appt with Dr.Pense appt scheduled for 08/2213 @2 :45.pt verbalized understanding

## 2013-09-04 ENCOUNTER — Ambulatory Visit (INDEPENDENT_AMBULATORY_CARE_PROVIDER_SITE_OTHER): Payer: BC Managed Care – PPO | Admitting: Interventional Cardiology

## 2013-09-04 ENCOUNTER — Encounter: Payer: Self-pay | Admitting: Interventional Cardiology

## 2013-09-04 VITALS — BP 116/78 | HR 78 | Ht 62.0 in | Wt 171.0 lb

## 2013-09-04 DIAGNOSIS — R42 Dizziness and giddiness: Secondary | ICD-10-CM

## 2013-09-04 DIAGNOSIS — I2510 Atherosclerotic heart disease of native coronary artery without angina pectoris: Secondary | ICD-10-CM

## 2013-09-04 DIAGNOSIS — Z951 Presence of aortocoronary bypass graft: Secondary | ICD-10-CM

## 2013-09-04 DIAGNOSIS — I219 Acute myocardial infarction, unspecified: Secondary | ICD-10-CM

## 2013-09-04 DIAGNOSIS — I10 Essential (primary) hypertension: Secondary | ICD-10-CM

## 2013-09-04 MED ORDER — LOSARTAN POTASSIUM-HCTZ 100-12.5 MG PO TABS: 0.5000 | ORAL_TABLET | Freq: Every day | ORAL | Status: DC

## 2013-09-04 MED ORDER — MECLIZINE HCL 12.5 MG PO TABS: 12.5000 mg | ORAL_TABLET | Freq: Three times a day (TID) | ORAL | Status: DC | PRN

## 2013-09-04 NOTE — Progress Notes (Signed)
Patient ID: Carolyn Fleming, female   DOB: 02/21/1951, 63 y.o.   MRN: [id number]    [address], [place]  [address] Phone: [phone] Fax:  [phone]  Date:  09/04/2013   ID:  Carolyn Fleming, DOB April 08, 1951, MRN [id number]  PCP:  Lilian Coma, MD   ASSESSMENT:  1. Dizziness with spinning sensation consistent with vertigo. The same complaint was associated with an episode of syncope on August 14, 2014  2. Vertigo occurred prior to fainting and has recurred once since then. 3. CAD with CABG x 2 and no symptoms of angina. 4. Relatively low blood pressures without orthostasis 4. Sleep apnea, treated  PLAN:  1. Decrease losartan HCT 100/12.5 mg one half tablet daily 2. Antivert 12.5 mg every 8 hours when necessary 3. Call if recurrent vertigo. If this occurs she may need to see ENT. 4. Record blood pressure at work once or twice per week over the next 2 weeks in response to the medication adjustment.   SUBJECTIVE: Carolyn Fleming is a 63 y.o. female with an episode of syncope on December 30. This started with a vertiginous feeling. She was sent to the emergency room by Dr. Stephanie Acre. She then underwent evaluation including blood work, x-ray, EKGs, and monitoring. No significant abnormality was found. 4 days ago she had a recurrence of vertigo when she stepped into her closet. She sat down and this feeling gradually resolved. She has had the dizziness/vertigo there are no other associated neurological complaints. Blood pressures were checked today including orthostatics and there was no evidence of blood pressure drop with standing. Her pressures are running in the 110 to 614 mmHg systolic range.   Wt Readings from Last 3 Encounters:  09/04/13 171 lb (77.565 kg)  12/28/12 180 lb (81.647 kg)  08/31/12 179 lb 12.8 oz (81.557 kg)     Past Medical History  Diagnosis Date  . Asthma   . Arthritis   . Sleep apnea     uses cpap  . Breast cancer   . GERD  (gastroesophageal reflux disease)   . Hyperlipidemia   . S/P CABG x 2 01/24/2012    Emergency LIMA to LAD, SVG to OM1, EVH via right thigh  . Obesity (BMI 35.0-39.9 without comorbidity) 01/25/2012  . Hypertension 01/25/2012    Current Outpatient Prescriptions  Medication Sig Dispense Refill  . amLODipine (NORVASC) 5 MG tablet Take 5 mg by mouth daily.       Marland Kitchen aspirin EC 81 MG tablet Take 81 mg by mouth daily.      Marland Kitchen atorvastatin (LIPITOR) 10 MG tablet Take 10 mg by mouth 3 (three) times a week. On Mondays and Wednesday and Fridays      . Betaxolol HCl (BETOPTIC-S OP) Apply 1 drop to eye 2 (two) times daily.       . bimatoprost (LUMIGAN) 0.01 % SOLN Place 1 drop into both eyes at bedtime.      . brimonidine (ALPHAGAN) 0.15 % ophthalmic solution Place 1 drop into both eyes 2 (two) times daily.      . dorzolamide (TRUSOPT) 2 % ophthalmic solution Place 1 drop into both eyes 3 (three) times daily.      Marland Kitchen losartan-hydrochlorothiazide (HYZAAR) 100-12.5 MG per tablet Take 0.5 tablets by mouth daily.      . meclizine (ANTIVERT) 12.5 MG tablet Take 1 tablet (12.5 mg total) by mouth 3 (three) times daily as needed for dizziness.  90 tablet  0  No current facility-administered medications for this visit.    Allergies:    Allergies  Allergen Reactions  . Codeine Other (See Comments)    Lethargy  . Latex     REACTION: hives  . Sulfonamide Derivatives     REACTION: gi upset    Social History:  The patient  reports that she has never smoked. She has never used smokeless tobacco. She reports that she does not drink alcohol or use illicit drugs.   ROS:  Please see the history of present illness.   Denies transient neurological complaints other than the vertigo. No chest discomfort or dyspnea. She denies lower extremity edema. No medication side effects.   All other systems reviewed and negative.   OBJECTIVE: VS:  BP 116/78  Pulse 78  Ht 5\' 2"  (1.575 m)  Wt 171 lb (77.565 kg)  BMI 31.27  kg/m2 Well nourished, well developed, in no acute distress, healthy HEENT: normal. No nystagmus. Neck: JVD flat. Carotid bruit absent  Cardiac:  normal S1, S2; RRR; no murmur Lungs:  clear to auscultation bilaterally, no wheezing, rhonchi or rales Abd: soft, nontender, no hepatomegaly Ext: Edema absent. Pulses 2+ bilateral Skin: warm and dry Neuro:  CNs 2-12 intact, no focal abnormalities noted  EKG:  Not repeated       Signed, Illene Labrador III, MD 09/04/2013 4:36 PM  Past Medical History  Asthma   Seasonal Allergic rhinitis   Glaucoma   Breast Cancer, 1999, right (s/p lumpectomy and chemo in [place])   Sleep apnea   family history of colon cancer (M-c.70) & colon p's (S); scr colon neg in [place] (c. '95?); neg 2/08, Dr. Cristina Gong   GERD; exacerbation 11/2011 (diet), BaS: +GER, dysmotility, no strict, transient pause of tablet    CAD with CABG x 2, June 2013, Dr. [name]   intolerant of statin, on Pharmquest study with [facility]   GYN: Dr. Dellis Filbert   DECLINES FLU SHOT

## 2013-09-04 NOTE — Patient Instructions (Addendum)
Start Antivert 12.mg Three times  daily as needed  Decrease Losartan Hctz to 1/2 tablet 50-6.25mg  daily  Have your blood pressure checked 1-2 per week over the next 2 weeks.  Your physician wants you to follow-up in: 3-6 months You will receive a reminder letter in the mail two months in advance. If you don't receive a letter, please call our office to schedule the follow-up appointment.

## 2013-09-05 ENCOUNTER — Telehealth: Payer: Self-pay | Admitting: Interventional Cardiology

## 2013-09-05 ENCOUNTER — Telehealth: Payer: Self-pay | Admitting: *Deleted

## 2013-09-05 DIAGNOSIS — J45909 Unspecified asthma, uncomplicated: Secondary | ICD-10-CM

## 2013-09-05 MED ORDER — AMLODIPINE BESYLATE 5 MG PO TABS: 5.0000 mg | ORAL_TABLET | Freq: Every day | ORAL | Status: DC

## 2013-09-05 MED ORDER — LOSARTAN POTASSIUM-HCTZ 100-12.5 MG PO TABS: 0.5000 | ORAL_TABLET | Freq: Every day | ORAL | Status: DC

## 2013-09-05 NOTE — Telephone Encounter (Deleted)
New Message  Please call in medication// Sent to refills//SR

## 2013-09-05 NOTE — Telephone Encounter (Signed)
returned pt call pt.adv that Dr.Cost is ok with her to [facility] primary Dr.Norrins if he is available.pt adv that I would put ref in and someone form our scheduling dept.would call her to get her scheduled in that office.pt verbalized understanding.

## 2013-09-05 NOTE — Addendum Note (Signed)
Addended by: Lamar Laundry on: 09/05/2013 04:34 PM   Modules accepted: Orders

## 2013-09-05 NOTE — Telephone Encounter (Signed)
Patient would like a referral to see a [place] pcp. She can be reached at her work number until 4:30 and after that on her home number that is listed. Please advise. Thanks, MI

## 2013-09-06 ENCOUNTER — Ambulatory Visit: Payer: BC Managed Care – PPO | Admitting: Interventional Cardiology

## 2013-09-07 NOTE — Telephone Encounter (Signed)
New problem   Pt's blood pressure reading are has following: 09-05-13   116/72 09-06-13    122/76 09-07-13    132/80  Please advise pt. She was very concern

## 2013-09-07 NOTE — Telephone Encounter (Signed)
returned pt call pt adv.that bp numbers were reviewed by Dr.Barrows. pt  is to continue to monitor bp, and call if bp is over 140/90. pt verbalized understanding.

## 2013-09-12 ENCOUNTER — Ambulatory Visit (INDEPENDENT_AMBULATORY_CARE_PROVIDER_SITE_OTHER): Payer: BC Managed Care – PPO | Admitting: Surgery

## 2013-09-14 ENCOUNTER — Telehealth: Payer: Self-pay | Admitting: Interventional Cardiology

## 2013-09-14 NOTE — Telephone Encounter (Signed)
lmom for pt to return call. °

## 2013-09-14 NOTE — Telephone Encounter (Signed)
New Message  Pt called to give BP readings. She is not sure why it keeps dropping and she feeling light headed. Please call back to assist.   01/28 112/76  01/30 110/72

## 2013-09-14 NOTE — Telephone Encounter (Signed)
Follow Up   Following up on call from earlier. BP reading below.  120/68

## 2013-09-14 NOTE — Telephone Encounter (Signed)
Follow Up ° ° °Returning call from earlier. Please call back. °

## 2013-09-14 NOTE — Telephone Encounter (Signed)
returned pt call pt sts that she took took her bp 1 1/2 hour after taking her bp med.adv pt to take again this afternoon and call back with that reading.

## 2013-09-21 NOTE — Telephone Encounter (Signed)
F/u     Please call concerning previous message

## 2013-09-21 NOTE — Telephone Encounter (Signed)
122/74.adv pt that those were good readings .pt can continue to monitor bp 1 weekly, and call if reading is consistently 140/80.pt agreeable with plan and verbalized understanding

## 2013-10-03 ENCOUNTER — Encounter (INDEPENDENT_AMBULATORY_CARE_PROVIDER_SITE_OTHER): Payer: Self-pay | Admitting: Surgery

## 2013-10-04 ENCOUNTER — Telehealth: Payer: Self-pay | Admitting: Interventional Cardiology

## 2013-10-04 NOTE — Telephone Encounter (Signed)
Follow UP:  Pt is calling back... States she is coughing really bad and wants a call back form Lattie Haw.

## 2013-10-04 NOTE — Telephone Encounter (Signed)
New Problem:  Pt is asking if she can take 800 mg of ibprofen and if she can take it... Can Dr. [name] call it in for her.. Pt states her throat is very red and sore.Marland KitchenMarland Kitchen

## 2013-10-04 NOTE — Telephone Encounter (Signed)
returned pt call.pt sts that she has had a cough and sore throat.adv pt that she needs to f/u with her pcp.pt sts that she was seen by her pcp last week and given 2 rounds of a z-pack.adv pt that she can take Tylenol for pain. and corticin for cold symptoms pt.if she does not feel better in the next couple of days to follow back up with her pcp.pt verbalized understanding.

## 2013-11-19 ENCOUNTER — Telehealth: Payer: Self-pay | Admitting: Interventional Cardiology

## 2013-11-19 ENCOUNTER — Other Ambulatory Visit (INDEPENDENT_AMBULATORY_CARE_PROVIDER_SITE_OTHER): Payer: BC Managed Care – PPO

## 2013-11-19 DIAGNOSIS — I2510 Atherosclerotic heart disease of native coronary artery without angina pectoris: Secondary | ICD-10-CM

## 2013-11-19 LAB — LIPID PANEL
CHOLESTEROL: 169 mg/dL (ref 0–200)
HDL: 39.9 mg/dL (ref >39.00)
LDL Cholesterol: 110 mg/dL — ABNORMAL HIGH (ref 0–99)
Total CHOL/HDL Ratio: 4
Triglycerides: 97 mg/dL (ref 0.0–149.0)
VLDL: 19.4 mg/dL (ref 0.0–40.0)

## 2013-11-19 LAB — ALT: ALT: 29 U/L (ref 0–35)

## 2013-11-19 NOTE — Telephone Encounter (Signed)
Patient has questions about lab work that was done today, please call an advise.

## 2013-11-19 NOTE — Telephone Encounter (Signed)
pt sts  that she is concerned baout her K+ her last bmet pt K+ was 07/2014 and was 3.4.pt was encoureged to increase K+ intake with diet.pt concerned that she had a reoccurence of that "wave" feeling she had in the past and would like her K+ rechecked.adv pt I will fwd a message to [place] for approval and if he is ok with her having a repeat bmet I will call her to sch a lab appt. Pt agreeable with plan and verbalized understanding.

## 2013-11-20 NOTE — Telephone Encounter (Signed)
Message copied by Lamar Laundry on Tue Nov 20, 2013  8:27 AM ------      Message from: Daneen Schick      Created: Mon Nov 19, 2013  4:42 PM       Much improved. Is she seeing Lipid clinic? ------

## 2013-11-20 NOTE — Telephone Encounter (Signed)
pt called in. pt given lab results.much improved.pt not participating in lipid clinic.pt sts that she is dieting and exercising and excited about her progress.pt rqst copy of labs mailed. done

## 2013-11-20 NOTE — Telephone Encounter (Signed)
Message copied by Lamar Laundry on Tue Nov 20, 2013  8:44 AM ------      Message from: Daneen Schick      Created: Mon Nov 19, 2013  4:42 PM       Much improved. Is she seeing Lipid clinic? ------

## 2013-11-20 NOTE — Telephone Encounter (Signed)
lmom.for pt to call back  Much improved. Is she seeing Lipid clinic?

## 2013-12-04 ENCOUNTER — Other Ambulatory Visit: Payer: Self-pay

## 2013-12-04 MED ORDER — ATORVASTATIN CALCIUM 10 MG PO TABS: 10.0000 mg | ORAL_TABLET | ORAL | Status: DC

## 2014-01-01 ENCOUNTER — Encounter: Payer: Self-pay | Admitting: Interventional Cardiology

## 2014-01-15 ENCOUNTER — Ambulatory Visit (INDEPENDENT_AMBULATORY_CARE_PROVIDER_SITE_OTHER): Payer: BC Managed Care – PPO | Admitting: Cardiology

## 2014-01-15 ENCOUNTER — Telehealth: Payer: Self-pay | Admitting: *Deleted

## 2014-01-15 VITALS — BP 110/60 | HR 68

## 2014-01-15 DIAGNOSIS — R42 Dizziness and giddiness: Secondary | ICD-10-CM

## 2014-01-15 LAB — BASIC METABOLIC PANEL
BUN: 18 mg/dL (ref 6–23)
CHLORIDE: 104 meq/L (ref 96–112)
CO2: 29 mEq/L (ref 19–32)
CREATININE: 1.1 mg/dL (ref 0.4–1.2)
Calcium: 8.9 mg/dL (ref 8.4–10.5)
GFR: 66.7 mL/min (ref >60.00)
Glucose, Bld: 82 mg/dL (ref 70–99)
Potassium: 3.7 mEq/L (ref 3.5–5.1)
SODIUM: 137 meq/L (ref 135–145)

## 2014-01-15 NOTE — Progress Notes (Signed)
Walk-in patient of Dr. [name]. Stated he told her to come in next time she experienced "dizziness and nausea like last time when K+ was low". Patient verbalized that yesterday she began to feel "not right" and with moderate nausea and "alot of dizziness like the room was about to spin". She stated that she lays down and it improves "somewhat". Denies vomiting but stated that she can't eat due to the nausea and the feeling that she is about to vomit. She stated that the last time this happened was around December 2014. She actually "blacked out" and fell backwards. She stated that her PCP sent her to ED and they ruled out cardiac issues and diagnosed her with vertigo. She visited Dr. [name] and he checked her K+ level and it was significantly low, per pt. After decreasing her Losartan and improving her diet with K+ rich foods, she reportedly felt better and it resolved. She stated that she did go to her PCP yesterday but that the patient was not told of any diagnosis "just gave me a Zpack rx". Patient states she feels like she may be having s/s of an oncoming sinus infection. Education provided regarding s/s of sinus infection can include dizziness and nausea. Reviewed medications with patient and she verified that she did not start the [place] yet and she does have Antivert still from last time.  Orthostatic BP's taken and WNL.  Pulse strong and regular. Dr. Radford Pax in to see patient. Stat BMET ordered. Patient education provided regarding not driving while dizzy. Patient verbalized understanding and agreed with precautions.  Patient seen and interviewed and data reviewed.  Agree with above documentation.  Symptoms most consistent with vertigo.  The room spins with any movement of her body along with nausea.  She also has a sinus infection and has not started her antibiotics.  I have recommended that we check a BMET to make sure her electrolytes are stable.  She is not orthostatic on exam.  VS are stable.  I have  recommended that she get in to see her PCP today.    Signed:  Fransico Him, MD

## 2014-01-15 NOTE — Telephone Encounter (Signed)
Patient called to inform us that she got home okay from her visit. She took her Antivert and a nap. She is feeling "a little bit better" but still a little nauseous. She thanked [place] for seeing her today without an appointment. Informed her that nurse will call her as soon as her Stat lab results are in.   BMET results WNL. Patient was notified. She was encouraged to call us back should she have any further questions or concerns. She was advised to follow up with her PCP and consider ENT referral. Patient requested that Dr. [name] advise her on ENT recommendation. Routed to Dr. [name].

## 2014-01-23 ENCOUNTER — Ambulatory Visit: Payer: BC Managed Care – PPO | Attending: Family Medicine

## 2014-01-23 DIAGNOSIS — IMO0001 Reserved for inherently not codable concepts without codable children: Secondary | ICD-10-CM | POA: Insufficient documentation

## 2014-01-23 DIAGNOSIS — M542 Cervicalgia: Secondary | ICD-10-CM | POA: Insufficient documentation

## 2014-01-23 DIAGNOSIS — M6281 Muscle weakness (generalized): Secondary | ICD-10-CM | POA: Insufficient documentation

## 2014-01-23 DIAGNOSIS — M25519 Pain in unspecified shoulder: Secondary | ICD-10-CM | POA: Insufficient documentation

## 2014-01-23 DIAGNOSIS — R5381 Other malaise: Secondary | ICD-10-CM | POA: Insufficient documentation

## 2014-01-29 ENCOUNTER — Ambulatory Visit: Payer: BC Managed Care – PPO | Admitting: Physical Therapy

## 2014-01-30 ENCOUNTER — Ambulatory Visit: Payer: BC Managed Care – PPO | Admitting: Physical Therapy

## 2014-02-05 ENCOUNTER — Ambulatory Visit: Payer: BC Managed Care – PPO | Admitting: Physical Therapy

## 2014-02-07 ENCOUNTER — Ambulatory Visit: Payer: BC Managed Care – PPO | Admitting: Physical Therapy

## 2014-02-11 ENCOUNTER — Ambulatory Visit: Payer: BC Managed Care – PPO

## 2014-02-12 ENCOUNTER — Ambulatory Visit: Payer: BC Managed Care – PPO | Admitting: Physical Therapy

## 2014-02-12 ENCOUNTER — Ambulatory Visit: Payer: BC Managed Care – PPO | Admitting: Interventional Cardiology

## 2014-02-18 ENCOUNTER — Ambulatory Visit: Payer: BC Managed Care – PPO

## 2014-02-20 ENCOUNTER — Ambulatory Visit: Payer: BC Managed Care – PPO | Admitting: Physical Therapy

## 2014-02-25 ENCOUNTER — Encounter: Payer: BC Managed Care – PPO | Admitting: Physical Therapy

## 2014-03-01 ENCOUNTER — Ambulatory Visit (INDEPENDENT_AMBULATORY_CARE_PROVIDER_SITE_OTHER): Payer: BC Managed Care – PPO | Admitting: Interventional Cardiology

## 2014-03-01 ENCOUNTER — Encounter: Payer: Self-pay | Admitting: Interventional Cardiology

## 2014-03-01 VITALS — BP 137/82 | HR 78 | Ht 62.0 in | Wt 176.0 lb

## 2014-03-01 DIAGNOSIS — I2510 Atherosclerotic heart disease of native coronary artery without angina pectoris: Secondary | ICD-10-CM

## 2014-03-01 DIAGNOSIS — E785 Hyperlipidemia, unspecified: Secondary | ICD-10-CM

## 2014-03-01 DIAGNOSIS — I10 Essential (primary) hypertension: Secondary | ICD-10-CM

## 2014-03-01 NOTE — Progress Notes (Signed)
Patient ID: Carolyn Fleming, female   DOB: [birth year], 63 y.o.   MRN: [id number]    [address], [address] [place], [place]  [address] Phone: [phone] Fax:  [phone]  Date:  03/01/2014   ID:  Carolyn Fleming, DOB 20-Apr-1951, MRN [id number]  PCP:  Lilian Coma, MD   ASSESSMENT:  1. Coronary artery disease, stable without angina 2. Hypertension, stable 3. Hyperlipidemia currently on research protocol 4. Right calf claudication improving with walking.  PLAN:  1. Heart healthy diet, decreased fluid intake, aerobic activity. 2. Clinical followup in one year 3. Call if any chest discomfort   SUBJECTIVE: Carolyn Fleming is a 63 y.o. female who is now 2 years status post coronary bypass grafting. She denies angina, orthopnea, PND, and syncope. She has occasional fluid retention. She tells me that she drinks between 4 and 616 ounce bottles of water per day at her primary care physician's instructions. She is walking on a regular basis without limitations. She does have claudication of the right calf but this is improving with walking.   Wt Readings from Last 3 Encounters:  03/01/14 176 lb (79.833 kg)  09/04/13 171 lb (77.565 kg)  12/28/12 180 lb (81.647 kg)     Past Medical History  Diagnosis Date  . Asthma   . Arthritis   . Sleep apnea     uses cpap  . Breast cancer   . GERD (gastroesophageal reflux disease)   . Hyperlipidemia   . S/P CABG x 2 01/24/2012    Emergency LIMA to LAD, SVG to OM1, EVH via right thigh  . Obesity (BMI 35.0-39.9 without comorbidity) 01/25/2012  . Hypertension 01/25/2012    Current Outpatient Prescriptions  Medication Sig Dispense Refill  . amLODipine (NORVASC) 5 MG tablet Take 1 tablet (5 mg total) by mouth daily.  90 tablet  2  . aspirin EC 81 MG tablet Take 81 mg by mouth daily.      Marland Kitchen atorvastatin (LIPITOR) 10 MG tablet Take 1 tablet (10 mg total) by mouth 3 (three) times a week. On Mondays and Wednesday and Fridays  36 tablet  3  .  Betaxolol HCl (BETOPTIC-S OP) Apply 1 drop to eye 2 (two) times daily.       . bimatoprost (LUMIGAN) 0.01 % SOLN Place 1 drop into both eyes at bedtime.      . brimonidine (ALPHAGAN) 0.15 % ophthalmic solution Place 1 drop into both eyes 2 (two) times daily.      . dorzolamide (TRUSOPT) 2 % ophthalmic solution Place 1 drop into both eyes 3 (three) times daily.      Marland Kitchen losartan-hydrochlorothiazide (HYZAAR) 100-12.5 MG per tablet Take 0.5 tablets by mouth daily.  45 tablet  2  . meclizine (ANTIVERT) 12.5 MG tablet Take 1 tablet (12.5 mg total) by mouth 3 (three) times daily as needed for dizziness.  90 tablet  0   No current facility-administered medications for this visit.    Allergies:    Allergies  Allergen Reactions  . Codeine Other (See Comments)    Lethargy  . Latex     REACTION: hives  . Sulfonamide Derivatives     REACTION: gi upset    Social History:  The patient  reports that she has never smoked. She has never used smokeless tobacco. She reports that she does not drink alcohol or use illicit drugs.   ROS:  Please see the history of present illness.   Be, stable appetite, no  weight loss, denies orthopnea PND, no palpitations or syncope.   All other systems reviewed and negative.   OBJECTIVE: VS:  BP 137/82  Pulse 78  Ht 5\' 2"  (1.575 m)  Wt 176 lb (79.833 kg)  BMI 32.18 kg/m2 Well nourished, well developed, in no acute distress, obese HEENT: normal Neck: JVD flat. Carotid bruit absent  Cardiac:  normal S1, S2; RRR; no murmur Lungs:  clear to auscultation bilaterally, no wheezing, rhonchi or rales Abd: soft, nontender, no hepatomegaly Ext: Edema absent. Pulses 2+ Skin: warm and dry Neuro:  CNs 2-12 intact, no focal abnormalities noted  EKG:  Not repeated       Signed, Illene Labrador III, MD 03/01/2014 3:18 PM

## 2014-03-01 NOTE — Patient Instructions (Signed)
Your physician recommends that you continue on your current medications as directed. Please refer to the Current Medication list given to you today.  Your physician discussed the importance of regular exercise and recommended that you start or continue a regular exercise program for good health.  REDUCE WATER INTAKE   Your physician wants you to follow-up in: [place] will receive a reminder letter in the mail two months in advance. If you don't receive a letter, please call our office to schedule the follow-up appointment.   Cardiac Diet This diet can help prevent heart disease and stroke. Many factors influence your heart health, including eating and exercise habits. Coronary risk rises a lot with abnormal blood fat (lipid) levels. Cardiac meal planning includes limiting unhealthy fats, increasing healthy fats, and making other small dietary changes. General guidelines are as follows:  Adjust calorie intake to reach and maintain desirable body weight.  Limit total fat intake to less than 30% of total calories. Saturated fat should be less than 7% of calories.  Saturated fats are found in animal products and in some vegetable products. Saturated vegetable fats are found in coconut oil, cocoa butter, palm oil, and palm kernel oil. Read labels carefully to avoid these products as much as possible. Use butter in moderation. Choose tub margarines and oils that have 2 grams of fat or less. Good cooking oils are canola and olive oils.  Practice low-fat cooking techniques. Do not fry food. Instead, broil, bake, boil, steam, grill, roast on a rack, stir-fry, or microwave it. Other fat reducing suggestions include:  Remove the skin from poultry.  Remove all visible fat from meats.  Skim the fat off stews, soups, and gravies before serving them.  Steam vegetables in water or broth instead of sauting them in fat.  Avoid foods with trans fat (or hydrogenated oils), such as commercially fried  foods and commercially baked goods. Commercial shortening and deep-frying fats will contain trans fat.  Increase intake of fruits, vegetables, whole grains, and legumes to replace foods high in fat.  Increase consumption of nuts, legumes, and seeds to at least 4 servings weekly. One serving of a legume equals  cup, and 1 serving of nuts or seeds equals  cup.  Choose whole grains more often. Have 3 servings per day (a serving is 1 ounce [oz]).  Eat 4 to 5 servings of vegetables per day. A serving of vegetables is 1 cup of raw leafy vegetables;  cup of raw or cooked cut-up vegetables;  cup of vegetable juice.  Eat 4 to 5 servings of fruit per day. A serving of fruit is 1 medium whole fruit;  cup of dried fruit;  cup of fresh, frozen, or canned fruit;  cup of 100% fruit juice.  Increase your intake of dietary fiber to 20 to 30 grams per day. Insoluble fiber may help lower your risk of heart disease and may help curb your appetite.  Soluble fiber binds cholesterol to be removed from the blood. Foods high in soluble fiber are dried beans, citrus fruits, oats, apples, bananas, broccoli, Brussels sprouts, and eggplant.  Try to include foods fortified with plant sterols or stanols, such as yogurt, breads, juices, or margarines. Choose several fortified foods to achieve a daily intake of 2 to 3 grams of plant sterols or stanols.  Foods with omega-3 fats can help reduce your risk of heart disease. Aim to have a 3.5 oz portion of fatty fish twice per week, such as salmon, mackerel,  albacore tuna, sardines, lake trout, or herring. If you wish to take a fish oil supplement, choose one that contains 1 gram of both DHA and EPA.  Limit processed meats to 2 servings (3 oz portion) weekly.  Limit the sodium in your diet to 1500 milligrams (mg) per day. If you have high blood pressure, talk to a registered dietitian about a DASH (Dietary Approaches to Stop Hypertension) eating plan.  Limit sweets and  beverages with added sugar, such as soda, to no more than 5 servings per week. One serving is:   1 tablespoon sugar.  1 tablespoon jelly or jam.   cup sorbet.  1 cup lemonade.   cup regular soda. CHOOSING FOODS Starches  Allowed: Breads: All kinds (wheat, rye, raisin, white, oatmeal, [place], [place], and English muffin bread). Low-fat rolls: English muffins, frankfurter and hamburger buns, bagels, pita bread, tortillas (not fried). Pancakes, waffles, biscuits, and muffins made with recommended oil.  Avoid: Products made with saturated or trans fats, oils, or whole milk products. Butter rolls, cheese breads, croissants. Commercial doughnuts, muffins, sweet rolls, biscuits, waffles, pancakes, store-bought mixes. Crackers  Allowed: Low-fat crackers and snacks: Animal, graham, rye, saltine (with recommended oil, no lard), oyster, and matzo crackers. Bread sticks, melba toast, rusks, flatbread, pretzels, and light popcorn.  Avoid: High-fat crackers: cheese crackers, butter crackers, and those made with coconut, palm oil, or trans fat (hydrogenated oils). Buttered popcorn. Cereals  Allowed: Hot or cold whole-grain cereals.  Avoid: Cereals containing coconut, hydrogenated vegetable fat, or animal fat. Potatoes / Pasta / Rice  Allowed: All kinds of potatoes, rice, and pasta (such as macaroni, spaghetti, and noodles).  Avoid: Pasta or rice prepared with cream sauce or high-fat cheese. Chow mein noodles, [place] fries. Vegetables  Allowed: All vegetables and vegetable juices.  Avoid: Fried vegetables. Vegetables in cream, butter, or high-fat cheese sauces. Limit coconut. Fruit in cream or custard. Protein  Allowed: Limit your intake of meat, seafood, and poultry to no more than 6 oz (cooked weight) per day. All lean, well-trimmed beef, veal, pork, and lamb. All chicken and [place] without skin. All fish and shellfish. Wild game: wild duck, rabbit, pheasant, and venison. Egg whites or  low-cholesterol egg substitutes may be used as desired. Meatless dishes: recipes with dried beans, peas, lentils, and tofu (soybean curd). Seeds and nuts: all seeds and most nuts.  Avoid: Prime grade and other heavily marbled and fatty meats, such as short ribs, spare ribs, rib eye roast or steak, frankfurters, sausage, bacon, and high-fat luncheon meats, mutton. Caviar. Commercially fried fish. Domestic duck, goose, venison sausage. Organ meats: liver, gizzard, heart, chitterlings, brains, kidney, sweetbreads. Dairy  Allowed: Low-fat cheeses: nonfat or low-fat cottage cheese (1% or 2% fat), cheeses made with part skim milk, such as mozzarella, farmers, string, or ricotta. (Cheeses should be labeled no more than 2 to 6 grams fat per oz.). Skim (or 1%) milk: liquid, powdered, or evaporated. Buttermilk made with low-fat milk. Drinks made with skim or low-fat milk or cocoa. Chocolate milk or cocoa made with skim or low-fat (1%) milk. Nonfat or low-fat yogurt.  Avoid: Whole milk cheeses, including colby, cheddar, [place], [place], [place], [place], [place], American, Swiss, and blue. Creamed cottage cheese, cream cheese. Whole milk and whole milk products, including buttermilk or yogurt made from whole milk, drinks made from whole milk. Condensed milk, evaporated whole milk, and 2% milk. Soups and Combination Foods  Allowed: Low-fat low-sodium soups: broth, dehydrated soups, homemade broth, soups with the fat removed, homemade  cream soups made with skim or low-fat milk. Low-fat spaghetti, lasagna, chili, and Spanish rice if low-fat ingredients and low-fat cooking techniques are used.  Avoid: Cream soups made with whole milk, cream, or high-fat cheese. All other soups. Desserts and Sweets  Allowed: Sherbet, fruit ices, gelatins, meringues, and angel food cake. Homemade desserts with recommended fats, oils, and milk products. Jam, jelly, honey, marmalade, sugars, and syrups. Pure sugar candy, such as  gum drops, hard candy, jelly beans, marshmallows, mints, and small amounts of dark chocolate.  Avoid: Commercially prepared cakes, pies, cookies, frosting, pudding, or mixes for these products. Desserts containing whole milk products, chocolate, coconut, lard, palm oil, or palm kernel oil. Ice cream or ice cream drinks. Candy that contains chocolate, coconut, butter, hydrogenated fat, or unknown ingredients. Buttered syrups. Fats and Oils  Allowed: Vegetable oils: safflower, sunflower, corn, soybean, cottonseed, sesame, canola, olive, or peanut. Non-hydrogenated margarines. Salad dressing or mayonnaise: homemade or commercial, made with a recommended oil. Low or nonfat salad dressing or mayonnaise.  Limit added fats and oils to 6 to 8 tsp per day (includes fats used in cooking, baking, salads, and spreads on bread). Remember to count the "hidden fats" in foods.  Avoid: Solid fats and shortenings: butter, lard, salt pork, bacon drippings. Gravy containing meat fat, shortening, or suet. Cocoa butter, coconut. Coconut oil, palm oil, palm kernel oil, or hydrogenated oils: these ingredients are often used in bakery products, nondairy creamers, whipped toppings, candy, and commercially fried foods. Read labels carefully. Salad dressings made of unknown oils, sour cream, or cheese, such as blue cheese and [place]. Cream, all kinds: half-and-half, light, heavy, or whipping. Sour cream or cream cheese (even if "light" or low-fat). Nondairy cream substitutes: coffee creamers and sour cream substitutes made with palm, palm kernel, hydrogenated oils, or coconut oil. Beverages  Allowed: Coffee (regular or decaffeinated), tea. Diet carbonated beverages, mineral water. Alcohol: Check with your caregiver. Moderation is recommended.  Avoid: Whole milk, regular sodas, and juice drinks with added sugar. Condiments  Allowed: All seasonings and condiments. Cocoa powder. "Cream" sauces made with recommended  ingredients.  Avoid: Carob powder made with hydrogenated fats. SAMPLE MENU Breakfast   cup orange juice   cup oatmeal  1 slice toast  1 tsp margarine  1 cup skim milk Lunch  [place] sandwich with 2 oz [place], 2 slices bread  Lettuce and tomato slices  Fresh fruit  Carrot sticks  Coffee or tea Snack  Fresh fruit or low-fat crackers Dinner  3 oz lean ground beef  1 baked potato  1 tsp margarine   cup asparagus  Lettuce salad  1 tbs non-creamy dressing   cup peach slices  1 cup skim milk Document Released: 05/11/2008 Document Revised: 02/01/2012 Document Reviewed: 10/26/2011 Exit[employer] Patient Information 2015 [place], [place]. This information is not intended to replace advice given to you by your health care provider. Make sure you discuss any questions you have with your health care provider.

## 2014-03-11 ENCOUNTER — Other Ambulatory Visit: Payer: Self-pay

## 2014-03-11 DIAGNOSIS — Z1231 Encounter for screening mammogram for malignant neoplasm of breast: Secondary | ICD-10-CM

## 2014-03-15 ENCOUNTER — Telehealth: Payer: Self-pay | Admitting: Interventional Cardiology

## 2014-03-15 NOTE — Telephone Encounter (Signed)
New Prob    Pt has some questions regarding her possible limitations per Cardiologist. Please call.

## 2014-03-15 NOTE — Telephone Encounter (Signed)
Spoke with patient who wants to know if she can go bowling.  I advised patient that at her last visit with Dr. [name] he recommended physical exercise.  Patient verbalized agreement and gratitude.

## 2014-03-28 ENCOUNTER — Telehealth: Payer: Self-pay | Admitting: Interventional Cardiology

## 2014-03-28 NOTE — Telephone Encounter (Signed)
No compression stockings. It will cause to feel worse.

## 2014-03-28 NOTE — Telephone Encounter (Signed)
lmtrc

## 2014-03-28 NOTE — Telephone Encounter (Signed)
Per pt Dr Irish Lack helps her with this Right leg.

## 2014-03-28 NOTE — Telephone Encounter (Signed)
Spoke with pt and she is having right calf pain, which Dr. [name] is aware of. She feels that the pain doesn't improve with wallking 5-10 minutes (excercising). She will get cramps when she walks long distances. Pt does fine at work with walking around the office (no pain). Pt wants to know if she should use compression stockings?

## 2014-03-28 NOTE — Telephone Encounter (Signed)
New question:    Per Pt has a question about compression hose. Can she wear them for the cramps she gets in her leg.

## 2014-03-29 NOTE — Telephone Encounter (Signed)
Pt.notified

## 2014-04-19 ENCOUNTER — Ambulatory Visit: Payer: BC Managed Care – PPO | Admitting: Internal Medicine

## 2014-04-24 ENCOUNTER — Telehealth: Payer: Self-pay | Admitting: Interventional Cardiology

## 2014-04-24 NOTE — Telephone Encounter (Signed)
°  Patient had flu shot years ago and had a reaction, it made her foam at the mouth until it was out of her system. Her employer recommends flu shot and she would like to you regarding the flu shot. please call and advise.

## 2014-04-24 NOTE — Telephone Encounter (Signed)
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

## 2014-04-24 NOTE — Telephone Encounter (Signed)
New problem:    Per pt she was Exposed to positive Whooping Cough,   Pt would like to know if she should do any thing else including the Zpac?  Please give her a call back at work.

## 2014-05-03 ENCOUNTER — Ambulatory Visit: Payer: BC Managed Care – PPO | Admitting: Physician Assistant

## 2014-05-14 ENCOUNTER — Ambulatory Visit: Payer: BC Managed Care – PPO | Admitting: Interventional Cardiology

## 2014-05-16 ENCOUNTER — Ambulatory Visit (INDEPENDENT_AMBULATORY_CARE_PROVIDER_SITE_OTHER): Payer: BC Managed Care – PPO | Admitting: Surgery

## 2014-07-01 ENCOUNTER — Ambulatory Visit
Admission: RE | Admit: 2014-07-01 | Discharge: 2014-07-01 | Disposition: A | Discharge status: Home / Self Care | Payer: BC Managed Care – HMO | Source: Ambulatory Visit

## 2014-07-01 DIAGNOSIS — Z1231 Encounter for screening mammogram for malignant neoplasm of breast: Secondary | ICD-10-CM

## 2014-07-25 ENCOUNTER — Encounter (HOSPITAL_COMMUNITY): Payer: Self-pay | Admitting: Interventional Cardiology

## 2014-10-07 ENCOUNTER — Ambulatory Visit: Payer: BC Managed Care – HMO | Admitting: Interventional Cardiology

## 2014-10-15 ENCOUNTER — Encounter: Payer: Self-pay | Admitting: Interventional Cardiology

## 2015-03-11 NOTE — Progress Notes (Signed)
Cardiology Office Note   Date:  03/12/2015   ID:  ANNELLA Fleming, DOB [birth year], MRN [id number]  PCP:  Lilian Coma, MD  Cardiologist:  [name], MD   Chief Complaint  Patient presents with  . Coronary Artery Disease      History of Present Illness: Carolyn Fleming is a 64 y.o. female who presents for coronary artery disease, prior LIMA to LAD and saphenous vein graft to OM1 (7829), diastolic heart failure, hypertension, and hyperlipidemia. Also has history of PAD with claudication right lower extremity.  Patient is doing well. She denies angina and dyspnea. She has not required nitroglycerin. There is no exertional claudication. This is improved tremendously. No medication side effects.    Past Medical History  Diagnosis Date  . Asthma   . Arthritis   . Sleep apnea     uses cpap  . Breast cancer   . GERD (gastroesophageal reflux disease)   . Hyperlipidemia   . S/P CABG x 2 01/24/2012    Emergency LIMA to LAD, SVG to OM1, EVH via right thigh  . Obesity (BMI 35.0-39.9 without comorbidity) 01/25/2012  . Hypertension 01/25/2012    Past Surgical History  Procedure Laterality Date  . Breast lumpectomy  1997    breast cancer  . Tubal ligation    . Abdominal hysterectomy    . Tonsillectomy    . Coronary artery bypass graft  01/24/2012    Procedure: CORONARY ARTERY BYPASS GRAFTING (CABG);  Surgeon: [name], MD;  Location: [facility];  Service: Open Heart Surgery;  Laterality: N/A;  Coronary Artery bypass Graft times two utilizing the left internal mammary artery and the right greater saphenous vein harvested endoscopically,  Transesophageal Echocardiogram  . Left heart catheterization with coronary angiogram N/A 01/24/2012    Procedure: LEFT HEART CATHETERIZATION WITH CORONARY ANGIOGRAM;  Surgeon: [name], MD;  Location: [facility];  Service: Cardiovascular;  Laterality: N/A;     Current Outpatient Prescriptions  Medication Sig Dispense Refill    . amLODipine (NORVASC) 5 MG tablet Take 1 tablet (5 mg total) by mouth daily. 90 tablet 2  . aspirin EC 81 MG tablet Take 81 mg by mouth daily.    Marland Kitchen atorvastatin (LIPITOR) 10 MG tablet Take 1 tablet (10 mg total) by mouth 3 (three) times a week. On Mondays and Wednesday and Fridays 36 tablet 3  . bimatoprost (LUMIGAN) 0.01 % SOLN Place 1 drop into both eyes at bedtime.    . brimonidine (ALPHAGAN) 0.15 % ophthalmic solution Place 1 drop into both eyes 2 (two) times daily.    . dorzolamide (TRUSOPT) 2 % ophthalmic solution Place 1 drop into both eyes 2 (two) times daily.     Marland Kitchen losartan-hydrochlorothiazide (HYZAAR) 100-12.5 MG per tablet Take 0.5 tablets by mouth daily. 45 tablet 2  . meclizine (ANTIVERT) 12.5 MG tablet Take 1 tablet (12.5 mg total) by mouth 3 (three) times daily as needed for dizziness. 90 tablet 0  . timolol (BETIMOL) 0.25 % ophthalmic solution Place 1 drop into both eyes 2 (two) times daily.     No current facility-administered medications for this visit.    Allergies:   Codeine; Latex; and Sulfonamide derivatives    Social History:  The patient  reports that she has never smoked. She has never used smokeless tobacco. She reports that she does not drink alcohol or use illicit drugs.   Family History:  The patient's family history includes Cancer in her  brother and mother.    ROS:  Please see the history of present illness.   Otherwise, review of systems are positive for increased stress and fatigue at work.   All other systems are reviewed and negative.    PHYSICAL EXAM: VS:  BP 120/90 mmHg  Pulse 64  Ht 5\' 2"  (1.575 m)  Wt 82.01 kg (180 lb 12.8 oz)  BMI 33.06 kg/m2 , BMI Body mass index is 33.06 kg/(m^2). GEN: Well nourished, well developed, in no acute distress HEENT: normal Neck: no JVD, carotid bruits, or masses Cardiac: RRR; no murmurs, rubs, or gallops,no edema  Respiratory:  clear to auscultation bilaterally, normal work of breathing GI: soft, nontender,  nondistended, + BS MS: no deformity or atrophy Skin: warm and dry, no rash Neuro:  Strength and sensation are intact Psych: euthymic mood, full affect   EKG:  EKG is ordered today. The ekg ordered today demonstrates normal sinus rhythm with T-wave inversion V1 through V2 unchanged from prior tracing. Paul wave progression V1 through V6. No change compared to prior.   Recent Labs: No results found for requested labs within last 365 days.    Lipid Panel    Component Value Date/Time   CHOL 169 11/19/2013 0754   TRIG 97.0 11/19/2013 0754   HDL 39.90 11/19/2013 0754   CHOLHDL 4 11/19/2013 0754   VLDL 19.4 11/19/2013 0754   LDLCALC 110* 11/19/2013 0754   LDLDIRECT 149.1 05/28/2013 0946      Wt Readings from Last 3 Encounters:  03/12/15 82.01 kg (180 lb 12.8 oz)  03/01/14 79.833 kg (176 lb)  09/04/13 77.565 kg (171 lb)      Other studies Reviewed: Additional studies/ records that were reviewed today include: None    ASSESSMENT AND PLAN:  1. Atherosclerosis of native coronary artery of native heart without angina pectoris Stable without anginal complaints  2. S/P CABG x 2 Now 3 years status post bypass surgery  3. Essential hypertension Controlled  4. Hyperlipidemia Current status unknown    Current medicines are reviewed at length with the patient today.  The patient does not have concerns regarding medicines.  The following changes have been made:  We encouraged patient to maintain an active lifestyle. The lipid panel will be done today.  Labs/ tests ordered today include:   Orders Placed This Encounter  Procedures  . Lipid panel  . Hepatic function panel  . EKG 12-Lead     Disposition:   FU with HS in 1 year  Signed, [name], MD  03/12/2015 8:34 AM    [facility] [place], [place], [place]  [address] Phone: [phone]; Fax: [phone]

## 2015-03-12 ENCOUNTER — Encounter: Payer: Self-pay | Admitting: Interventional Cardiology

## 2015-03-12 ENCOUNTER — Ambulatory Visit (INDEPENDENT_AMBULATORY_CARE_PROVIDER_SITE_OTHER): Payer: BLUE CROSS/BLUE SHIELD | Admitting: Interventional Cardiology

## 2015-03-12 VITALS — BP 120/90 | HR 64 | Ht 62.0 in | Wt 180.8 lb

## 2015-03-12 DIAGNOSIS — I2510 Atherosclerotic heart disease of native coronary artery without angina pectoris: Secondary | ICD-10-CM | POA: Diagnosis not present

## 2015-03-12 DIAGNOSIS — E785 Hyperlipidemia, unspecified: Secondary | ICD-10-CM

## 2015-03-12 DIAGNOSIS — I10 Essential (primary) hypertension: Secondary | ICD-10-CM

## 2015-03-12 DIAGNOSIS — Z951 Presence of aortocoronary bypass graft: Secondary | ICD-10-CM | POA: Diagnosis not present

## 2015-03-12 LAB — LIPID PANEL
Cholesterol: 182 mg/dL (ref 0–200)
HDL: 35.6 mg/dL — ABNORMAL LOW (ref >39.00)
LDL Cholesterol: 122 mg/dL — ABNORMAL HIGH (ref 0–99)
NonHDL: 146.4
Total CHOL/HDL Ratio: 5
Triglycerides: 120 mg/dL (ref 0.0–149.0)
VLDL: 24 mg/dL (ref 0.0–40.0)

## 2015-03-12 LAB — HEPATIC FUNCTION PANEL
ALBUMIN: 3.9 g/dL (ref 3.5–5.2)
ALK PHOS: 69 U/L (ref 39–117)
ALT: 19 U/L (ref 0–35)
AST: 22 U/L (ref 0–37)
Bilirubin, Direct: 0.1 mg/dL (ref 0.0–0.3)
Total Bilirubin: 0.4 mg/dL (ref 0.2–1.2)
Total Protein: 7.5 g/dL (ref 6.0–8.3)

## 2015-03-12 NOTE — Patient Instructions (Signed)
Medication Instructions:  Your physician recommends that you continue on your current medications as directed. Please refer to the Current Medication list given to you today.   Labwork: Lipid and Lft today  Testing/Procedures: None   Follow-Up: Your physician wants you to follow-up in: 1 year with Dr.Heidemann You will receive a reminder letter in the mail two months in advance. If you don't receive a letter, please call our office to schedule the follow-up appointment.   Any Other Special Instructions Will Be Listed Below (If Applicable).

## 2015-03-14 ENCOUNTER — Telehealth: Payer: Self-pay | Admitting: Interventional Cardiology

## 2015-03-14 NOTE — Telephone Encounter (Signed)
New message      Pt returning Pam's call Please call to discuss

## 2015-03-14 NOTE — Telephone Encounter (Signed)
Left message to call back  

## 2015-03-14 NOTE — Telephone Encounter (Signed)
Called patient about lab results. Per Dr. [name], Patient's LDL is not at goal,nd he wants to see if patient can tolerate daily statin therapy. Patient stated she cannot tolerate a daily statin therapy. Patient stated she can take half a pill three days a week, but unable to do more than that. Patient also said her levels are up because she has been under a lot of stress. Patient stated that she has this under control, and she is working on this and it will improve. Will forward to Dr. [name] for further advisement.

## 2015-04-02 ENCOUNTER — Telehealth: Payer: Self-pay | Admitting: Interventional Cardiology

## 2015-04-02 NOTE — Telephone Encounter (Signed)
It is okay to use Tylenol. If this doesn't help decrease atorvastatin to twice weekly.

## 2015-04-02 NOTE — Telephone Encounter (Signed)
Follow up   Pt is calling back again to speak to Rn about if she should take her medication or not   Please call pt

## 2015-04-02 NOTE — Telephone Encounter (Signed)
Returned pt call. Pt sts that since starting atorvastatin 5mg  M,W,F. She has had increased leg muscle and joint pain. She is having to take Tylenol to help her sleep. Adv her I will fwd an update to Dr.Vastine and call back with his recommendation. Pt agreeable and verbalized understanding.

## 2015-04-02 NOTE — Telephone Encounter (Signed)
New message     Pt c/o medication issue:  1. Name of Medication: Lipitor  2. How are you currently taking this medication (dosage and times per day)? 5 mg on Monday, Wednesday & Friday)  3. Are you having a reaction (difficulty breathing--STAT)? No  4. What is your medication issue? Pain in legs and arms; legs feel tired  Please call to discuss

## 2015-04-03 NOTE — Telephone Encounter (Signed)
Pt aware of Dr.Duba recommendation. It is okay to use Tylenol. If this doesn't help decrease atorvastatin to twice weekly Pt agreeable with plan and verbalized understanding.

## 2015-04-29 ENCOUNTER — Telehealth: Payer: Self-pay | Admitting: Interventional Cardiology

## 2015-04-29 NOTE — Telephone Encounter (Signed)
New message     Dr Stephanie Acre office faxed recent ov notes and ekg to Dr [name].  Patient want to know if she need to keep her appt with Dr [name] on Friday based on the ov notes and ekg.  Please call pt and let her know

## 2015-04-30 ENCOUNTER — Ambulatory Visit: Payer: BLUE CROSS/BLUE SHIELD | Admitting: Interventional Cardiology

## 2015-04-30 NOTE — Telephone Encounter (Signed)
Returned call to Morton @ Dr.Wolters office. She sts that she has been trying to fax over pt o/v and ekg since 9/9 to our main fax # it has been busy.adv her that I have not received the fax. She will attempt to refax to [phone]. Adv her I will look out for it today

## 2015-05-02 ENCOUNTER — Ambulatory Visit (INDEPENDENT_AMBULATORY_CARE_PROVIDER_SITE_OTHER): Payer: BLUE CROSS/BLUE SHIELD | Admitting: Interventional Cardiology

## 2015-05-02 ENCOUNTER — Encounter: Payer: Self-pay | Admitting: Interventional Cardiology

## 2015-05-02 VITALS — BP 128/78 | HR 70 | Ht 62.0 in | Wt 183.1 lb

## 2015-05-02 DIAGNOSIS — I25811 Atherosclerosis of native coronary artery of transplanted heart without angina pectoris: Secondary | ICD-10-CM | POA: Diagnosis not present

## 2015-05-02 DIAGNOSIS — I252 Old myocardial infarction: Secondary | ICD-10-CM

## 2015-05-02 DIAGNOSIS — I25810 Atherosclerosis of coronary artery bypass graft(s) without angina pectoris: Secondary | ICD-10-CM

## 2015-05-02 DIAGNOSIS — I10 Essential (primary) hypertension: Secondary | ICD-10-CM | POA: Diagnosis not present

## 2015-05-02 DIAGNOSIS — E785 Hyperlipidemia, unspecified: Secondary | ICD-10-CM

## 2015-05-02 LAB — CBC WITH DIFFERENTIAL/PLATELET
BASOS ABS: 0 10*3/uL (ref 0.0–0.1)
Basophils Relative: 0.6 % (ref 0.0–3.0)
EOS PCT: 7.2 % — AB (ref 0.0–5.0)
Eosinophils Absolute: 0.3 10*3/uL (ref 0.0–0.7)
HCT: 42.7 % (ref 36.0–46.0)
HEMOGLOBIN: 14.1 g/dL (ref 12.0–15.0)
LYMPHS ABS: 1.5 10*3/uL (ref 0.7–4.0)
Lymphocytes Relative: 34.4 % (ref 12.0–46.0)
MCHC: 33.1 g/dL (ref 30.0–36.0)
MCV: 85.5 fl (ref 78.0–100.0)
MONO ABS: 0.4 10*3/uL (ref 0.1–1.0)
Monocytes Relative: 10.1 % (ref 3.0–12.0)
Neutro Abs: 2.1 10*3/uL (ref 1.4–7.7)
Neutrophils Relative %: 47.7 % (ref 43.0–77.0)
Platelets: 219 10*3/uL (ref 150.0–400.0)
RBC: 4.99 Mil/uL (ref 3.87–5.11)
RDW: 14.2 % (ref 11.5–15.5)
WBC: 4.4 10*3/uL (ref 4.0–10.5)

## 2015-05-02 LAB — BASIC METABOLIC PANEL
BUN: 20 mg/dL (ref 6–23)
CALCIUM: 9 mg/dL (ref 8.4–10.5)
CO2: 28 mEq/L (ref 19–32)
Chloride: 105 mEq/L (ref 96–112)
Creatinine, Ser: 1.24 mg/dL — ABNORMAL HIGH (ref 0.40–1.20)
GFR: 56.03 mL/min — AB (ref >60.00)
GLUCOSE: 98 mg/dL (ref 70–99)
POTASSIUM: 3.8 meq/L (ref 3.5–5.1)
Sodium: 139 mEq/L (ref 135–145)

## 2015-05-02 LAB — PROTIME-INR
INR: 1.2 ratio — AB (ref 0.8–1.0)
Prothrombin Time: 13.5 s — ABNORMAL HIGH (ref 9.6–13.1)

## 2015-05-02 MED ORDER — CLOPIDOGREL BISULFATE 75 MG PO TABS: 75.0000 mg | ORAL_TABLET | Freq: Every day | ORAL | Status: DC

## 2015-05-02 MED ORDER — ISOSORBIDE MONONITRATE ER 60 MG PO TB24: 60.0000 mg | ORAL_TABLET | Freq: Every day | ORAL | Status: DC

## 2015-05-02 NOTE — Patient Instructions (Signed)
Medication Instructions:  Your physician has recommended you make the following change in your medication:  1) START Plavix 75mg  daily. An Rx has been sent to your pharmacy 2) START Isosorbide 60mg  daily. An Rx has been sent to your pharmacy   Labwork: Bmet, Cbc, Pt/Inr today  Testing/Procedures: Your physician has requested that you have a cardiac catheterization. Cardiac catheterization is used to diagnose and/or treat various heart conditions. Doctors may recommend this procedure for a number of different reasons. The most common reason is to evaluate chest pain. Chest pain can be a symptom of coronary artery disease (CAD), and cardiac catheterization can show whether plaque is narrowing or blocking your heart's arteries. This procedure is also used to evaluate the valves, as well as measure the blood flow and oxygen levels in different parts of your heart. For further information please visit [email or web address]. Please follow instruction sheet, as given.    Follow-Up: Your physician recommends that you schedule a follow-up appointment pending cath   Any Other Special Instructions Will Be Listed Below (If Applicable).

## 2015-05-02 NOTE — Telephone Encounter (Signed)
Records received. Pt seen today @ 8:30 am

## 2015-05-02 NOTE — Progress Notes (Signed)
Cardiology Office Note   Date:  05/02/2015   ID:  Carolyn Fleming, DOB [birth year], MRN [id number]  PCP:  Lilian Coma, MD  Cardiologist:  [name], MD   Chief Complaint  Patient presents with  . Coronary Artery Disease      History of Present Illness: Carolyn Fleming is a 64 y.o. female who presents for presents for coronary artery disease, prior LIMA to LAD and saphenous vein graft to OM1 (8295), diastolic heart failure, hypertension, and hyperlipidemia. Also has history of PAD with claudication right lower extremity.  Over the past week, Carolyn Fleming is a recurring episodes of "indigestion" that are very similar to pre-CABG but not as severe. She has had some associated nausea. The discomfort is nonexertional. She denies dyspnea. Nitroglycerin helps. She has not had lower extremity swelling.  Past Medical History  Diagnosis Date  . Asthma   . Arthritis   . Sleep apnea     uses cpap  . Breast cancer   . GERD (gastroesophageal reflux disease)   . Hyperlipidemia   . S/P CABG x 2 01/24/2012    Emergency LIMA to LAD, SVG to OM1, EVH via right thigh  . Obesity (BMI 35.0-39.9 without comorbidity) 01/25/2012  . Hypertension 01/25/2012    Past Surgical History  Procedure Laterality Date  . Breast lumpectomy  1997    breast cancer  . Tubal ligation    . Abdominal hysterectomy    . Tonsillectomy    . Coronary artery bypass graft  01/24/2012    Procedure: CORONARY ARTERY BYPASS GRAFTING (CABG);  Surgeon: [name], MD;  Location: [facility];  Service: Open Heart Surgery;  Laterality: N/A;  Coronary Artery bypass Graft times two utilizing the left internal mammary artery and the right greater saphenous vein harvested endoscopically,  Transesophageal Echocardiogram  . Left heart catheterization with coronary angiogram N/A 01/24/2012    Procedure: LEFT HEART CATHETERIZATION WITH CORONARY ANGIOGRAM;  Surgeon: [name], MD;  Location: [facility] CATH LAB;  Service:  Cardiovascular;  Laterality: N/A;     Current Outpatient Prescriptions  Medication Sig Dispense Refill  . amLODipine (NORVASC) 5 MG tablet Take 1 tablet (5 mg total) by mouth daily. 90 tablet 2  . aspirin EC 81 MG tablet Take 81 mg by mouth daily.    Marland Kitchen atorvastatin (LIPITOR) 10 MG tablet Take 1 tablet (10 mg total) by mouth 3 (three) times a week. On Mondays and Wednesday and Fridays 36 tablet 3  . brimonidine (ALPHAGAN) 0.15 % ophthalmic solution Place 1 drop into both eyes 2 (two) times daily.    . dorzolamide (TRUSOPT) 2 % ophthalmic solution Place 1 drop into both eyes 2 (two) times daily.     Marland Kitchen latanoprost (XALATAN) 0.005 % ophthalmic solution Place 1 drop into both eyes at bedtime.  12  . losartan-hydrochlorothiazide (HYZAAR) 100-12.5 MG per tablet Take 0.5 tablets by mouth daily. 45 tablet 2  . meclizine (ANTIVERT) 12.5 MG tablet Take 1 tablet (12.5 mg total) by mouth 3 (three) times daily as needed for dizziness. 90 tablet 0  . NITROSTAT 0.4 MG SL tablet Place 1 tablet under the tongue every 5 (five) minutes as needed for chest pain.   0  . timolol (BETIMOL) 0.25 % ophthalmic solution Place 1 drop into both eyes every morning.     . clopidogrel (PLAVIX) 75 MG tablet Take 1 tablet (75 mg total) by mouth daily. 30 tablet 11  . isosorbide mononitrate (IMDUR) 60  MG 24 hr tablet Take 1 tablet (60 mg total) by mouth daily. 30 tablet 11   No current facility-administered medications for this visit.    Allergies:   Codeine; Latex; and Sulfonamide derivatives    Social History:  The patient  reports that she has never smoked. She has never used smokeless tobacco. She reports that she does not drink alcohol or use illicit drugs.   Family History:  The patient's family history includes Cancer in her brother and mother.    ROS:  Please see the history of present illness.   Otherwise, review of systems are positive for chest pressure, anxiety, and insomnia. Also some difficulty with  claudication..   All other systems are reviewed and negative.    PHYSICAL EXAM: VS:  BP 128/78 mmHg  Pulse 70  Ht 5\' 2"  (1.575 m)  Wt 83.063 kg (183 lb 1.9 oz)  BMI 33.48 kg/m2  SpO2 98% , BMI Body mass index is 33.48 kg/(m^2). GEN: Well nourished, well developed, in no acute distress HEENT: normal Neck: no JVD, carotid bruits, or masses Cardiac: RRR.  There is no murmur, rub, or gallop. There is no edema. Respiratory:  clear to auscultation bilaterally, normal work of breathing. GI: soft, nontender, nondistended, + BS MS: no deformity or atrophy Skin: warm and dry, no rash Neuro:  Strength and sensation are intact Psych: euthymic mood, full affect   EKG:  EKG is not ordered today. The ekg performed at Dr. Cheron Schaumann also is on 04/26/15 reveals poor R-wave progression with T-wave inversion V1 through the 3.   Recent Labs: 03/12/2015: ALT 19    Lipid Panel    Component Value Date/Time   CHOL 182 03/12/2015 0837   TRIG 120.0 03/12/2015 0837   HDL 35.60* 03/12/2015 0837   CHOLHDL 5 03/12/2015 0837   VLDL 24.0 03/12/2015 0837   LDLCALC 122* 03/12/2015 0837   LDLDIRECT 149.1 05/28/2013 0946      Wt Readings from Last 3 Encounters:  05/02/15 83.063 kg (183 lb 1.9 oz)  03/12/15 82.01 kg (180 lb 12.8 oz)  03/01/14 79.833 kg (176 lb)      Other studies Reviewed: Additional studies/ records that were reviewed today include: Reviewed prior angiograms and surgical bypass report...    ASSESSMENT AND PLAN:  1. Atherosclerosis of native coronary artery of  heart with angina pectoris  Recurrent angina similar to pre-bypass. Bypass graft failure needs to be excluded. Progression of disease and ungrafted territories needs to be considered. - Basic metabolic panel - CBC w/Diff - INR/PT - isosorbide mononitrate (IMDUR) 60 MG 24 hr tablet; Take 1 tablet (60 mg total) by mouth daily.  Dispense: 30 tablet; Refill: 11 - clopidogrel (PLAVIX) 75 MG tablet; Take 1 tablet (75 mg total)  by mouth daily.  Dispense: 30 tablet; Refill: 11  2. Hyperlipidemia On therapy  3. Essential hypertension Control  4. Old MI (myocardial infarction) Previous anterior myocardial infarction. Current EF is uncertain - clopidogrel (PLAVIX) 75 MG tablet; Take 1 tablet (75 mg total) by mouth daily.  Dispense: 30 tablet; Refill: 11  5. CAD of autologous bypass graft There is concern for bypass graft failure.   Current medicines are reviewed at length with the patient today.  The patient has the following concerns regarding medicines: None.  The following changes/actions have been instituted:    Start Imdur 60 mg per day  Plavix 75 mg per day  Coronary artery and bypass graft angiography as soon as possible. The patient was counseled  to undergo heart catheterization which may include left and right heart evaluation, coronary angiography, and possible percutaneous coronary intervention with stent implantation. The procedural risks and benefits were discussed in detail. The risks discussed included death, stroke, myocardial infarction, life-threatening bleeding, limb ischemia, kidney injury, allergy, and possible emergency cardiac surgery. The risk of these significant complications occurred less than 1% of the time. After discussion, the patient has agreed to proceed.  Summary will nitroglycerin if recurrent chest discomfort  Hospital of prolonged chest discomfort or acceleration in pattern  Labs/ tests ordered today include:   Orders Placed This Encounter  Procedures  . Basic metabolic panel  . CBC w/Diff  . INR/PT   Prolonged VISIT various multiple questions were answered.  Disposition:   FU with HS in  PRN after cath.  Signed, [name], MD  05/02/2015 8:43 AM    [address], [place], [place]  [address] Phone: [phone]; Fax: [phone]

## 2015-05-04 ENCOUNTER — Other Ambulatory Visit: Payer: Self-pay | Admitting: Interventional Cardiology

## 2015-05-06 ENCOUNTER — Telehealth: Payer: Self-pay | Admitting: Interventional Cardiology

## 2015-05-06 NOTE — Telephone Encounter (Signed)
New Message       Pt calling to clarify the medications that she needs to take in the morning prior to her procedure tomorrow. Please call back and advise.

## 2015-05-06 NOTE — Interval H&P Note (Signed)
Cath Lab Visit (complete for each Cath Lab visit)  Clinical Evaluation Leading to the Procedure:   ACS: No.  Non-ACS:    Anginal Classification: CCS IV  Anti-ischemic medical therapy: Maximal Therapy (2 or more classes of medications)  Non-Invasive Test Results: No non-invasive testing performed  Prior CABG: Previous CABG      History and Physical Interval Note:  05/06/2015 5:59 PM  Asa Lente  has presented today for surgery, with the diagnosis of cad with angina  The various methods of treatment have been discussed with the patient and family. After consideration of risks, benefits and other options for treatment, the patient has consented to  Procedure(s): Left Heart Cath and Cors/Grafts Angiography (N/A) as a surgical intervention .  The patient's history has been reviewed, patient examined, no change in status, stable for surgery.  I have reviewed the patient's chart and labs.  Questions were answered to the patient's satisfaction.     [name]

## 2015-05-06 NOTE — Telephone Encounter (Signed)
Returned pt call. Adv pt that she does not need to HOLD any medications prior to her cardiac cath. Pt sts that she has been 1 Nitro tab daily. Pt denies chest pain, she thought she was suppose to take it daily. Pt adv that Nitro is as needed for chest pain, instructions given again on how and when to use Nitro, pt voiced appreciations and verbalized understanding.

## 2015-05-06 NOTE — H&P (View-Only) (Signed)
Cardiology Office Note   Date:  05/02/2015   ID:  Carolyn Fleming, DOB 09/18/1950, MRN [id number]  PCP:  Lilian Coma, MD  Cardiologist:  [name], MD   Chief Complaint  Patient presents with  . Coronary Artery Disease      History of Present Illness: Carolyn Fleming is a 64 y.o. female who presents for presents for coronary artery disease, prior LIMA to LAD and saphenous vein graft to OM1 (7939), diastolic heart failure, hypertension, and hyperlipidemia. Also has history of PAD with claudication right lower extremity.  Over the past week, Carolyn Fleming is a recurring episodes of "indigestion" that are very similar to pre-CABG but not as severe. She has had some associated nausea. The discomfort is nonexertional. She denies dyspnea. Nitroglycerin helps. She has not had lower extremity swelling.  Past Medical History  Diagnosis Date  . Asthma   . Arthritis   . Sleep apnea     uses cpap  . Breast cancer   . GERD (gastroesophageal reflux disease)   . Hyperlipidemia   . S/P CABG x 2 01/24/2012    Emergency LIMA to LAD, SVG to OM1, EVH via right thigh  . Obesity (BMI 35.0-39.9 without comorbidity) 01/25/2012  . Hypertension 01/25/2012    Past Surgical History  Procedure Laterality Date  . Breast lumpectomy  1997    breast cancer  . Tubal ligation    . Abdominal hysterectomy    . Tonsillectomy    . Coronary artery bypass graft  01/24/2012    Procedure: CORONARY ARTERY BYPASS GRAFTING (CABG);  Surgeon: [name], MD;  Location: [facility];  Service: Open Heart Surgery;  Laterality: N/A;  Coronary Artery bypass Graft times two utilizing the left internal mammary artery and the right greater saphenous vein harvested endoscopically,  Transesophageal Echocardiogram  . Left heart catheterization with coronary angiogram N/A 01/24/2012    Procedure: LEFT HEART CATHETERIZATION WITH CORONARY ANGIOGRAM;  Surgeon: [name], MD;  Location: [facility] CATH LAB;  Service:  Cardiovascular;  Laterality: N/A;     Current Outpatient Prescriptions  Medication Sig Dispense Refill  . amLODipine (NORVASC) 5 MG tablet Take 1 tablet (5 mg total) by mouth daily. 90 tablet 2  . aspirin EC 81 MG tablet Take 81 mg by mouth daily.    Marland Kitchen atorvastatin (LIPITOR) 10 MG tablet Take 1 tablet (10 mg total) by mouth 3 (three) times a week. On Mondays and Wednesday and Fridays 36 tablet 3  . brimonidine (ALPHAGAN) 0.15 % ophthalmic solution Place 1 drop into both eyes 2 (two) times daily.    . dorzolamide (TRUSOPT) 2 % ophthalmic solution Place 1 drop into both eyes 2 (two) times daily.     Marland Kitchen latanoprost (XALATAN) 0.005 % ophthalmic solution Place 1 drop into both eyes at bedtime.  12  . losartan-hydrochlorothiazide (HYZAAR) 100-12.5 MG per tablet Take 0.5 tablets by mouth daily. 45 tablet 2  . meclizine (ANTIVERT) 12.5 MG tablet Take 1 tablet (12.5 mg total) by mouth 3 (three) times daily as needed for dizziness. 90 tablet 0  . NITROSTAT 0.4 MG SL tablet Place 1 tablet under the tongue every 5 (five) minutes as needed for chest pain.   0  . timolol (BETIMOL) 0.25 % ophthalmic solution Place 1 drop into both eyes every morning.     . clopidogrel (PLAVIX) 75 MG tablet Take 1 tablet (75 mg total) by mouth daily. 30 tablet 11  . isosorbide mononitrate (IMDUR) 60  MG 24 hr tablet Take 1 tablet (60 mg total) by mouth daily. 30 tablet 11   No current facility-administered medications for this visit.    Allergies:   Codeine; Latex; and Sulfonamide derivatives    Social History:  The patient  reports that she has never smoked. She has never used smokeless tobacco. She reports that she does not drink alcohol or use illicit drugs.   Family History:  The patient's family history includes Cancer in her brother and mother.    ROS:  Please see the history of present illness.   Otherwise, review of systems are positive for chest pressure, anxiety, and insomnia. Also some difficulty with  claudication..   All other systems are reviewed and negative.    PHYSICAL EXAM: VS:  BP 128/78 mmHg  Pulse 70  Ht 5\' 2"  (1.575 m)  Wt 83.063 kg (183 lb 1.9 oz)  BMI 33.48 kg/m2  SpO2 98% , BMI Body mass index is 33.48 kg/(m^2). GEN: Well nourished, well developed, in no acute distress HEENT: normal Neck: no JVD, carotid bruits, or masses Cardiac: RRR.  There is no murmur, rub, or gallop. There is no edema. Respiratory:  clear to auscultation bilaterally, normal work of breathing. GI: soft, nontender, nondistended, + BS MS: no deformity or atrophy Skin: warm and dry, no rash Neuro:  Strength and sensation are intact Psych: euthymic mood, full affect   EKG:  EKG is not ordered today. The ekg performed at Dr. Cheron Schaumann also is on 04/26/15 reveals poor R-wave progression with T-wave inversion V1 through the 3.   Recent Labs: 03/12/2015: ALT 19    Lipid Panel    Component Value Date/Time   CHOL 182 03/12/2015 0837   TRIG 120.0 03/12/2015 0837   HDL 35.60* 03/12/2015 0837   CHOLHDL 5 03/12/2015 0837   VLDL 24.0 03/12/2015 0837   LDLCALC 122* 03/12/2015 0837   LDLDIRECT 149.1 05/28/2013 0946      Wt Readings from Last 3 Encounters:  05/02/15 83.063 kg (183 lb 1.9 oz)  03/12/15 82.01 kg (180 lb 12.8 oz)  03/01/14 79.833 kg (176 lb)      Other studies Reviewed: Additional studies/ records that were reviewed today include: Reviewed prior angiograms and surgical bypass report...    ASSESSMENT AND PLAN:  1. Atherosclerosis of native coronary artery of  heart with angina pectoris  Recurrent angina similar to pre-bypass. Bypass graft failure needs to be excluded. Progression of disease and ungrafted territories needs to be considered. - Basic metabolic panel - CBC w/Diff - INR/PT - isosorbide mononitrate (IMDUR) 60 MG 24 hr tablet; Take 1 tablet (60 mg total) by mouth daily.  Dispense: 30 tablet; Refill: 11 - clopidogrel (PLAVIX) 75 MG tablet; Take 1 tablet (75 mg total)  by mouth daily.  Dispense: 30 tablet; Refill: 11  2. Hyperlipidemia On therapy  3. Essential hypertension Control  4. Old MI (myocardial infarction) Previous anterior myocardial infarction. Current EF is uncertain - clopidogrel (PLAVIX) 75 MG tablet; Take 1 tablet (75 mg total) by mouth daily.  Dispense: 30 tablet; Refill: 11  5. CAD of autologous bypass graft There is concern for bypass graft failure.   Current medicines are reviewed at length with the patient today.  The patient has the following concerns regarding medicines: None.  The following changes/actions have been instituted:    Start Imdur 60 mg per day  Plavix 75 mg per day  Coronary artery and bypass graft angiography as soon as possible. The patient was counseled  to undergo heart catheterization which may include left and right heart evaluation, coronary angiography, and possible percutaneous coronary intervention with stent implantation. The procedural risks and benefits were discussed in detail. The risks discussed included death, stroke, myocardial infarction, life-threatening bleeding, limb ischemia, kidney injury, allergy, and possible emergency cardiac surgery. The risk of these significant complications occurred less than 1% of the time. After discussion, the patient has agreed to proceed.  Summary will nitroglycerin if recurrent chest discomfort  Hospital of prolonged chest discomfort or acceleration in pattern  Labs/ tests ordered today include:   Orders Placed This Encounter  Procedures  . Basic metabolic panel  . CBC w/Diff  . INR/PT   Prolonged VISIT various multiple questions were answered.  Disposition:   FU with HS in  PRN after cath.  Signed, [name], MD  05/02/2015 8:43 AM    [address], [place], [place]  [address] Phone: [phone]; Fax: [phone]

## 2015-05-07 ENCOUNTER — Ambulatory Visit (HOSPITAL_COMMUNITY)
Admission: RE | Admit: 2015-05-07 | Discharge: 2015-05-07 | Disposition: A | Discharge status: Home / Self Care | Payer: BLUE CROSS/BLUE SHIELD | Source: Ambulatory Visit | Attending: Interventional Cardiology | Admitting: Interventional Cardiology

## 2015-05-07 ENCOUNTER — Encounter (HOSPITAL_COMMUNITY)
Admission: RE | Disposition: A | Discharge status: Home / Self Care | Payer: Self-pay | Source: Ambulatory Visit | Attending: Interventional Cardiology

## 2015-05-07 DIAGNOSIS — Z7902 Long term (current) use of antithrombotics/antiplatelets: Secondary | ICD-10-CM | POA: Insufficient documentation

## 2015-05-07 DIAGNOSIS — I25110 Atherosclerotic heart disease of native coronary artery with unstable angina pectoris: Secondary | ICD-10-CM | POA: Insufficient documentation

## 2015-05-07 DIAGNOSIS — I252 Old myocardial infarction: Secondary | ICD-10-CM | POA: Diagnosis not present

## 2015-05-07 DIAGNOSIS — I2582 Chronic total occlusion of coronary artery: Secondary | ICD-10-CM | POA: Insufficient documentation

## 2015-05-07 DIAGNOSIS — Z7982 Long term (current) use of aspirin: Secondary | ICD-10-CM | POA: Diagnosis not present

## 2015-05-07 DIAGNOSIS — Z951 Presence of aortocoronary bypass graft: Secondary | ICD-10-CM | POA: Diagnosis not present

## 2015-05-07 DIAGNOSIS — I10 Essential (primary) hypertension: Secondary | ICD-10-CM | POA: Diagnosis present

## 2015-05-07 DIAGNOSIS — I25810 Atherosclerosis of coronary artery bypass graft(s) without angina pectoris: Secondary | ICD-10-CM | POA: Diagnosis present

## 2015-05-07 DIAGNOSIS — I2510 Atherosclerotic heart disease of native coronary artery without angina pectoris: Secondary | ICD-10-CM | POA: Diagnosis not present

## 2015-05-07 DIAGNOSIS — E785 Hyperlipidemia, unspecified: Secondary | ICD-10-CM | POA: Diagnosis present

## 2015-05-07 HISTORY — PX: CARDIAC CATHETERIZATION: SHX172

## 2015-05-07 SURGERY — LEFT HEART CATH AND CORS/GRAFTS ANGIOGRAPHY
Anesthesia: LOCAL

## 2015-05-07 MED ORDER — HEPARIN SODIUM (PORCINE) 1000 UNIT/ML IJ SOLN
INTRAMUSCULAR | Status: AC
  Filled 2015-05-07: qty 1

## 2015-05-07 MED ORDER — HEPARIN SODIUM (PORCINE) 1000 UNIT/ML IJ SOLN
INTRAMUSCULAR | Status: DC | PRN
  Administered 2015-05-07: 4000 [IU] via INTRAVENOUS

## 2015-05-07 MED ORDER — LIDOCAINE HCL (PF) 1 % IJ SOLN
INTRAMUSCULAR | Status: AC
  Filled 2015-05-07: qty 30

## 2015-05-07 MED ORDER — MIDAZOLAM HCL 2 MG/2ML IJ SOLN
INTRAMUSCULAR | Status: AC
  Filled 2015-05-07: qty 4

## 2015-05-07 MED ORDER — HEPARIN (PORCINE) IN NACL 2-0.9 UNIT/ML-% IJ SOLN
INTRAMUSCULAR | Status: AC
  Filled 2015-05-07: qty 1500

## 2015-05-07 MED ORDER — FENTANYL CITRATE (PF) 100 MCG/2ML IJ SOLN
INTRAMUSCULAR | Status: DC | PRN
  Administered 2015-05-07: 50 ug via INTRAVENOUS

## 2015-05-07 MED ORDER — VERAPAMIL HCL 2.5 MG/ML IV SOLN
INTRAVENOUS | Status: AC
  Filled 2015-05-07: qty 2

## 2015-05-07 MED ORDER — HEPARIN (PORCINE) IN NACL 2-0.9 UNIT/ML-% IJ SOLN
INTRAMUSCULAR | Status: DC | PRN
  Administered 2015-05-07: 5 mL

## 2015-05-07 MED ORDER — MIDAZOLAM HCL 2 MG/2ML IJ SOLN
INTRAMUSCULAR | Status: DC | PRN
  Administered 2015-05-07 (×2): 1 mg via INTRAVENOUS

## 2015-05-07 MED ORDER — VERAPAMIL HCL 2.5 MG/ML IV SOLN
INTRAVENOUS | Status: DC | PRN
  Administered 2015-05-07: 10 mL via INTRA_ARTERIAL

## 2015-05-07 MED ORDER — FENTANYL CITRATE (PF) 100 MCG/2ML IJ SOLN
INTRAMUSCULAR | Status: AC
  Filled 2015-05-07: qty 4

## 2015-05-07 MED ORDER — SODIUM CHLORIDE 0.9 % WEIGHT BASED INFUSION
1.0000 mL/kg/h | INTRAVENOUS | Status: DC
  Administered 2015-05-07: 1 mL/kg/h via INTRAVENOUS

## 2015-05-07 MED ORDER — ACETAMINOPHEN 325 MG PO TABS: 650.0000 mg | ORAL_TABLET | ORAL | Status: DC | PRN

## 2015-05-07 MED ORDER — SODIUM CHLORIDE 0.9 % WEIGHT BASED INFUSION
3.0000 mL/kg/h | INTRAVENOUS | Status: DC
  Administered 2015-05-07: 3 mL/kg/h via INTRAVENOUS

## 2015-05-07 MED ORDER — FENTANYL CITRATE (PF) 100 MCG/2ML IJ SOLN
INTRAMUSCULAR | Status: DC | PRN
  Administered 2015-05-07: 25 ug via INTRAVENOUS
  Administered 2015-05-07: 50 ug via INTRAVENOUS

## 2015-05-07 MED ORDER — SODIUM CHLORIDE 0.9 % IJ SOLN: 3.0000 mL | Freq: Two times a day (BID) | INTRAMUSCULAR | Status: DC

## 2015-05-07 MED ORDER — IOHEXOL 350 MG/ML SOLN
INTRAVENOUS | Status: DC | PRN
  Administered 2015-05-07: 125 mL via INTRA_ARTERIAL

## 2015-05-07 MED ORDER — ONDANSETRON HCL 4 MG/2ML IJ SOLN: 4.0000 mg | Freq: Four times a day (QID) | INTRAMUSCULAR | Status: DC | PRN

## 2015-05-07 MED ORDER — SODIUM CHLORIDE 0.9 % IV SOLN: 250.0000 mL | INTRAVENOUS | Status: DC | PRN

## 2015-05-07 MED ORDER — SODIUM CHLORIDE 0.9 % WEIGHT BASED INFUSION
3.0000 mL/kg/h | INTRAVENOUS | Status: AC
  Administered 2015-05-07: 3 mL/kg/h via INTRAVENOUS

## 2015-05-07 MED ORDER — SODIUM CHLORIDE 0.9 % IJ SOLN: 3.0000 mL | INTRAMUSCULAR | Status: DC | PRN

## 2015-05-07 MED ORDER — ASPIRIN 81 MG PO CHEW: 81.0000 mg | CHEWABLE_TABLET | ORAL | Status: DC

## 2015-05-07 SURGICAL SUPPLY — 11 items
CATH INFINITI 5 FR JL3.5 (CATHETERS) ×2 IMPLANT
CATH INFINITI 5FR JL4 (CATHETERS) ×2 IMPLANT
CATH INFINITI JR4 5F (CATHETERS) ×2 IMPLANT
DEVICE RAD COMP TR BAND LRG (VASCULAR PRODUCTS) ×2 IMPLANT
GLIDESHEATH SLEND A-KIT 6F 22G (SHEATH) ×2 IMPLANT
KIT HEART LEFT (KITS) ×2 IMPLANT
PACK CARDIAC CATHETERIZATION (CUSTOM PROCEDURE TRAY) ×2 IMPLANT
TRANSDUCER W/STOPCOCK (MISCELLANEOUS) ×2 IMPLANT
TUBING CIL FLEX 10 FLL-RA (TUBING) ×2 IMPLANT
WIRE HI TORQ VERSACORE-J 145CM (WIRE) ×2 IMPLANT
WIRE SAFE-T 1.5MM-J .035X260CM (WIRE) ×2 IMPLANT

## 2015-05-07 NOTE — Progress Notes (Addendum)
Pt brought from procedure room postponing cath due to incoming STEMI. Pt sedated but oriented and appropriate. Family at bedside. To monitor recording Vitals.

## 2015-05-07 NOTE — Discharge Instructions (Signed)
Radial Site Care °Refer to this sheet in the next few weeks. These instructions provide you with information on caring for yourself after your procedure. Your caregiver may also give you more specific instructions. Your treatment has been planned according to current medical practices, but problems sometimes occur. Call your caregiver if you have any problems or questions after your procedure. °HOME CARE INSTRUCTIONS °· You may shower the day after the procedure. Remove the bandage (dressing) and gently wash the site with plain soap and water. Gently pat the site dry. °· Do not apply powder or lotion to the site. °· Do not submerge the affected site in water for 3 to 5 days. °· Inspect the site at least twice daily. °· Do not flex or bend the affected arm for 24 hours. °· No lifting over 5 pounds (2.3 kg) for 5 days after your procedure. °· Do not drive home if you are discharged the same day of the procedure. Have someone else drive you. °· You may drive 24 hours after the procedure unless otherwise instructed by your caregiver. °· Do not operate machinery or power tools for 24 hours. °· A responsible adult should be with you for the first 24 hours after you arrive home. °What to expect: °· Any bruising will usually fade within 1 to 2 weeks. °· Blood that collects in the tissue (hematoma) may be painful to the touch. It should usually decrease in size and tenderness within 1 to 2 weeks. °SEEK IMMEDIATE MEDICAL CARE IF: °· You have unusual pain at the radial site. °· You have redness, warmth, swelling, or pain at the radial site. °· You have drainage (other than a small amount of blood on the dressing). °· You have chills. °· You have a fever or persistent symptoms for more than 72 hours. °· You have a fever and your symptoms suddenly get worse. °· Your arm becomes pale, cool, tingly, or numb. °· You have heavy bleeding from the site. Hold pressure on the site. CALL 911 °Document Released: 09/04/2010 Document  Revised: 10/25/2011 Document Reviewed: 09/04/2010 °Exit[employer]® Patient Information ©2015 [employer]. This information is not intended to replace advice given to you by your health care provider. Make sure you discuss any questions you have with your health care provider. ° °

## 2015-05-07 NOTE — Interval H&P Note (Signed)
History and Physical Interval Note:  05/07/2015 Cath Lab Visit (complete for each Cath Lab visit)  Clinical Evaluation Leading to the Procedure:   ACS: No.  Non-ACS:    Anginal Classification: CCS IV  Anti-ischemic medical therapy: Maximal Therapy (2 or more classes of medications)  Non-Invasive Test Results: No non-invasive testing performed  Prior CABG: Previous CABG       9:01 AM  Carolyn Fleming  has presented today for surgery, with the diagnosis of cad with angina  The various methods of treatment have been discussed with the patient and family. After consideration of risks, benefits and other options for treatment, the patient has consented to  Procedure(s): Left Heart Cath and Cors/Grafts Angiography (N/A) as a surgical intervention .  The patient's history has been reviewed, patient examined, no change in status, stable for surgery.  I have reviewed the patient's chart and labs.  Questions were answered to the patient's satisfaction.     [name]

## 2015-05-08 ENCOUNTER — Encounter (HOSPITAL_COMMUNITY): Payer: Self-pay | Admitting: Interventional Cardiology

## 2015-05-08 ENCOUNTER — Telehealth: Payer: Self-pay | Admitting: Interventional Cardiology

## 2015-05-08 NOTE — Telephone Encounter (Signed)
Pt aware of post cath f/u appt scheduled on 10/3 @ 8am

## 2015-05-08 NOTE — Telephone Encounter (Signed)
New problem   Pt need to speak to nurse concerning an appt post cath she had done yesterday.

## 2015-05-15 ENCOUNTER — Telehealth: Payer: Self-pay | Admitting: Interventional Cardiology

## 2015-05-15 NOTE — Telephone Encounter (Signed)
Pt had a cardiac cath on 9/21 last week. Pt states that last night she started having blurred vision in both eyes, but there blurring is more on her   left eye. Pt has used regular eye drops. Pt said that she started on 2 new medications; IMDUR 60 mg  and Plavix 75 mg. Pt has an appointment  to the eye doctor today. She did not know what doctor to see for this symptom. Pt was made aware that she needs to keep the appointment with the eye doctor. Pt has a post-cath F/U appointment with Dr. [name] Monday 05/19/15 at 8:00 AM. Pt wants for Dr. [name] to know about her symptoms. Pt is aware to call back after seen he eye doctor if needed.

## 2015-05-15 NOTE — Telephone Encounter (Signed)
Pt calling had a hard time seeing vision very blurry

## 2015-05-19 ENCOUNTER — Encounter: Payer: Self-pay | Admitting: Interventional Cardiology

## 2015-05-19 ENCOUNTER — Ambulatory Visit (INDEPENDENT_AMBULATORY_CARE_PROVIDER_SITE_OTHER): Payer: BLUE CROSS/BLUE SHIELD | Admitting: Interventional Cardiology

## 2015-05-19 VITALS — BP 120/82 | HR 78 | Ht 62.0 in | Wt 180.1 lb

## 2015-05-19 DIAGNOSIS — E785 Hyperlipidemia, unspecified: Secondary | ICD-10-CM

## 2015-05-19 DIAGNOSIS — I10 Essential (primary) hypertension: Secondary | ICD-10-CM

## 2015-05-19 DIAGNOSIS — I25810 Atherosclerosis of coronary artery bypass graft(s) without angina pectoris: Secondary | ICD-10-CM | POA: Diagnosis not present

## 2015-05-19 NOTE — Progress Notes (Signed)
Cardiology Office Note   Date:  05/19/2015   ID:  Carolyn Fleming, DOB 11-Jan-1951, MRN [id number]  PCP:  Lilian Coma, MD  Cardiologist:  [name], MD   Chief Complaint  Patient presents with  . Coronary Artery Disease      History of Present Illness: Carolyn Fleming is a 64 y.o. female who presents for  CAD with prior CABG, hyperlipidemia, hypertension, and obesity. Also has sleep apnea and uses CPAP.   Recent recurrence of angina. This led to coronary angiography. Angiography demonstrated patent saphenous vein graft to OM and LIMA to LAD. He did identify that the left main is now totally occluded. The LIMA to LAD supplies limited retrograde  Flow in the distribution of the proximal LAD and diagonals. There is very little retrograde circumflex flow from the SVG to OM into this area. We considered PCI of the totally occluded left main. First we instituted and he ischemic therapy with isosorbide. This is rendered the patient asymptomatic. She is back to her normal functional capacity without complaints.   There are no side effects on isosorbide.    Past Medical History  Diagnosis Date  . Asthma   . Arthritis   . Sleep apnea     uses cpap  . Breast cancer ([place])   . GERD (gastroesophageal reflux disease)   . Hyperlipidemia   . S/P CABG x 2 01/24/2012    Emergency LIMA to LAD, SVG to OM1, EVH via right thigh  . Obesity (BMI 35.0-39.9 without comorbidity) ([place]) 01/25/2012  . Hypertension 01/25/2012    Past Surgical History  Procedure Laterality Date  . Breast lumpectomy  1997    breast cancer  . Tubal ligation    . Abdominal hysterectomy    . Tonsillectomy    . Coronary artery bypass graft  01/24/2012    Procedure: CORONARY ARTERY BYPASS GRAFTING (CABG);  Surgeon: [name], MD;  Location: [facility];  Service: Open Heart Surgery;  Laterality: N/A;  Coronary Artery bypass Graft times two utilizing the left internal mammary artery and the right greater saphenous  vein harvested endoscopically,  Transesophageal Echocardiogram  . Left heart catheterization with coronary angiogram N/A 01/24/2012    Procedure: LEFT HEART CATHETERIZATION WITH CORONARY ANGIOGRAM;  Surgeon: [name], MD;  Location: [facility] CATH LAB;  Service: Cardiovascular;  Laterality: N/A;  . Cardiac catheterization N/A 05/07/2015    Procedure: Left Heart Cath and Cors/Grafts Angiography;  Surgeon: [name], MD;  Location: [facility] CV LAB;  Service: Cardiovascular;  Laterality: N/A;     Current Outpatient Prescriptions  Medication Sig Dispense Refill  . amLODipine (NORVASC) 5 MG tablet Take 1 tablet (5 mg total) by mouth daily. 90 tablet 2  . aspirin EC 81 MG tablet Take 81 mg by mouth daily.    Marland Kitchen atorvastatin (LIPITOR) 10 MG tablet Take 1 tablet (10 mg total) by mouth 3 (three) times a week. On Mondays and Wednesday and Fridays (Patient taking differently: Take 10 mg by mouth 3 (three) times a week. On Mondays Wednesday and Friday) 36 tablet 3  . brimonidine (ALPHAGAN) 0.15 % ophthalmic solution Place 1 drop into both eyes 2 (two) times daily.    . clopidogrel (PLAVIX) 75 MG tablet Take 75 mg by mouth every evening.     . dorzolamide (TRUSOPT) 2 % ophthalmic solution Place 1 drop into both eyes 2 (two) times daily.     . isosorbide mononitrate (IMDUR) 60 MG 24  hr tablet Take 1 tablet (60 mg total) by mouth daily. 30 tablet 11  . latanoprost (XALATAN) 0.005 % ophthalmic solution Place 1 drop into both eyes at bedtime.  12  . levocetirizine (XYZAL) 5 MG tablet Take 5 mg by mouth every evening.  3  . losartan-hydrochlorothiazide (HYZAAR) 50-12.5 MG per tablet Take 1 tablet by mouth daily.    . meclizine (ANTIVERT) 12.5 MG tablet Take 1 tablet (12.5 mg total) by mouth 3 (three) times daily as needed for dizziness. 90 tablet 0  . NITROSTAT 0.4 MG SL tablet Place 1 tablet under the tongue every 5 (five) minutes as needed for chest pain.   0  . timolol (BETIMOL) 0.25 % ophthalmic  solution Place 1 drop into both eyes every morning.      No current facility-administered medications for this visit.    Allergies:   Codeine; Latex; and Sulfonamide derivatives    Social History:  The patient  reports that she has never smoked. She has never used smokeless tobacco. She reports that she does not drink alcohol or use illicit drugs.   Family History:  The patient's family history includes Cancer in her brother and mother.    ROS:  Please see the history of present illness.   Otherwise, review of systems are positive for  none.   All other systems are reviewed and negative.    PHYSICAL EXAM: VS:  BP 120/82 mmHg  Pulse 78  Ht 5\' 2"  (1.575 m)  Wt 81.702 kg (180 lb 1.9 oz)  BMI 32.94 kg/m2  SpO2 98% , BMI Body mass index is 32.94 kg/(m^2). GEN: Well nourished, well developed, in no acute distress HEENT: normal Neck: no JVD, carotid bruits, or masses Cardiac:  RRR.  There  is no murmur, rub, or gallop. There is  no edema. Respiratory:  clear to auscultation bilaterally, normal work of breathing. GI: soft, nontender, nondistended, + BS MS: no deformity or atrophy Skin: warm and dry, no rash Neuro:  Strength and sensation are intact Psych: euthymic mood, full affect   EKG:  EKG  Is not ordered today.   Recent Labs: 03/12/2015: ALT 19 05/02/2015: BUN 20; Creatinine, Ser 1.24*; Hemoglobin 14.1; Platelets 219.0; Potassium 3.8; Sodium 139    Lipid Panel    Component Value Date/Time   CHOL 182 03/12/2015 0837   TRIG 120.0 03/12/2015 0837   HDL 35.60* 03/12/2015 0837   CHOLHDL 5 03/12/2015 0837   VLDL 24.0 03/12/2015 0837   LDLCALC 122* 03/12/2015 0837   LDLDIRECT 149.1 05/28/2013 0946      Wt Readings from Last 3 Encounters:  05/19/15 81.702 kg (180 lb 1.9 oz)  05/07/15 83.008 kg (183 lb)  05/02/15 83.063 kg (183 lb 1.9 oz)      Other studies Reviewed: Additional studies/ records that were reviewed today include:  Angiography and discussed with  patient.. .    ASSESSMENT AND PLAN:  1. CAD of autologous bypass graft  total occlusion of the native left main. Patent LIMA to LAD and SVG to OM. Isosorbide added to the treatment regimen rendering the patient asymptomatic. No further management issues at this time.  2. Essential hypertension  controlled  3. Hyperlipidemia  treated by primary care    Current medicines are reviewed at length with the patient today.  The patient has the following concerns regarding medicines:  none.  The following changes/actions have been instituted:    Stop Plavix in December  Six-month follow-up  Call if angina  Eventually  DC Imdur  Labs/ tests ordered today include:  No orders of the defined types were placed in this encounter.     Disposition:   FU with HS in 6 months  Signed, [name], MD  05/19/2015 8:16 AM    [facility] [place], [place], [place]  [address] Phone: [phone]; Fax: [phone]

## 2015-05-19 NOTE — Patient Instructions (Signed)
Medication Instructions:  STOP Plavix in December 2016  Labwork: None ordered  Testing/Procedures: None ordered  Follow-Up: Your physician wants you to follow-up in: 6 months with Dr.Darco You will receive a reminder letter in the mail two months in advance. If you don't receive a letter, please call our office to schedule the follow-up appointment.   Any Other Special Instructions Will Be Listed Below (If Applicable).

## 2015-05-30 ENCOUNTER — Other Ambulatory Visit: Payer: Self-pay

## 2015-05-30 DIAGNOSIS — Z1231 Encounter for screening mammogram for malignant neoplasm of breast: Secondary | ICD-10-CM

## 2015-06-25 ENCOUNTER — Ambulatory Visit: Payer: BLUE CROSS/BLUE SHIELD | Attending: Family Medicine

## 2015-06-25 DIAGNOSIS — R293 Abnormal posture: Secondary | ICD-10-CM | POA: Insufficient documentation

## 2015-06-25 DIAGNOSIS — M79602 Pain in left arm: Secondary | ICD-10-CM | POA: Diagnosis not present

## 2015-06-25 NOTE — Therapy (Signed)
[facility] [address] [place], [place], [address] Phone: [phone]   Fax:  [phone]  Physical Therapy Evaluation  Patient Details  Name: Carolyn Fleming MRN: [id number] Date of Birth: 18-Aug-1950 Referring Provider: Jonathon Jordan, MD  Encounter Date: 06/25/2015      PT End of Session - 06/25/15 1138    Visit Number 1   Date for PT Re-Evaluation 08/20/15   PT Start Time 1056   PT Stop Time 1148   PT Time Calculation (min) 52 min   Activity Tolerance Patient tolerated treatment well   Behavior During Therapy [facility] for tasks assessed/performed      Past Medical History  Diagnosis Date  . Asthma   . Arthritis   . Sleep apnea     uses cpap  . Breast cancer ([place])   . GERD (gastroesophageal reflux disease)   . Hyperlipidemia   . S/P CABG x 2 01/24/2012    Emergency LIMA to LAD, SVG to OM1, EVH via right thigh  . Obesity (BMI 35.0-39.9 without comorbidity) ([place]) 01/25/2012  . Hypertension 01/25/2012    Past Surgical History  Procedure Laterality Date  . Breast lumpectomy  1997    breast cancer  . Tubal ligation    . Abdominal hysterectomy    . Tonsillectomy    . Coronary artery bypass graft  01/24/2012    Procedure: CORONARY ARTERY BYPASS GRAFTING (CABG);  Surgeon: [name], MD;  Location: [place];  Service: Open Heart Surgery;  Laterality: N/A;  Coronary Artery bypass Graft times two utilizing the left internal mammary artery and the right greater saphenous vein harvested endoscopically,  Transesophageal Echocardiogram  . Left heart catheterization with coronary angiogram N/A 01/24/2012    Procedure: LEFT HEART CATHETERIZATION WITH CORONARY ANGIOGRAM;  Surgeon: [name], MD;  Location: [facility] CATH LAB;  Service: Cardiovascular;  Laterality: N/A;  . Cardiac catheterization N/A 05/07/2015    Procedure: Left Heart Cath and Cors/Grafts Angiography;  Surgeon: [name], MD;  Location: [place] CV  LAB;  Service: Cardiovascular;  Laterality: N/A;    There were no vitals filed for this visit.  Visit Diagnosis:  Arm pain, left - Plan: PT plan of care cert/re-cert  Posture abnormality - Plan: PT plan of care cert/re-cert      Subjective Assessment - 06/25/15 1101    Subjective Pt presents to PT with intermittent history of Lt shoulder/arm pain that began when she got a new chair at work last year. 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  Pt has PT at this clinic and was doing better.  Pain began again 05/2015 because her chair is being used by others and her adjustments are not staying in her chair.     Pertinent History history of Rt breast cancer- no [place] or arm bike   Limitations Sitting   How long can you sit comfortably? at [place] minutes   Diagnostic tests MRI complete 2014: C6-5 shallow disc bulge and spondylosis   Patient Stated Goals reduce Lt UE pain when at work, sit with neutral posture   Currently in Pain? Yes   Pain Score 9   9/10 at work, 5/10 at rest   Pain Location Arm   Pain Orientation Left   Pain Descriptors / Indicators Throbbing   Pain Type Acute pain   Pain Onset More than a month ago   Pain Frequency Constant   Aggravating Factors  sitting in  chair at work, typing   Pain Relieving Factors change of position, leaning to the Rt            [facility] PT Assessment - 06/25/15 0001    Assessment   Medical Diagnosis Lt arm pain (E52.778)   Referring Provider Jonathon Jordan, MD   Onset Date/Surgical Date 06/06/15   Hand Dominance Right   Next MD Visit none   Prior Therapy 2014 for the same problem   Precautions   Precautions Other (comment)  Breast Cancer: no UBE or [place]   Precaution Comments No [place] or arm bike   Restrictions   Weight Bearing Restrictions No   Balance Screen   Has the patient fallen in the past 6 months No   Has the patient had a decrease in activity level because of a fear of falling?  No   Is the patient reluctant to  leave their home because of a fear of falling?  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  Pt with stiffness reported in bil. UT with cervical sidebending.  Bil. UE AROM is full.     Strength   Overall Strength Within functional limits for tasks performed   Overall Strength Comments 4+/5 bilateral UE strength Rt=Lt   Palpation   Spinal mobility reduced PA mobility in the cervical spine by 50% without pain   Palpation comment Pt wit significant tension in bil. UT and cervical paraspinals                   [facility] Adult PT Treatment/Exercise - 06/25/15 0001    Modalities   Modalities Traction   Traction   Type of Traction Cervical   Min (lbs) 5   Max (lbs) 15   Hold Time 60   Rest Time 10   Time 15                PT Education - 06/25/15 1126    Education provided Yes   Education Details HEP: cervical AROM, scap squeezes, posture education   Person(s) Educated Patient   Methods Explanation;Demonstration;Handout   Comprehension Verbalized understanding;Returned demonstration          PT Short Term Goals - 06/25/15 1144    PT SHORT TERM GOAL #1   Title be independent in initial HEP   Time 4   Period Weeks   Status New    PT SHORT TERM GOAL #2   Title report a 25% reduction in Lt UE pain with sitting at work   Time 4   Period Weeks   Status New   PT SHORT TERM GOAL #3   Title sit with neutral seated posture at least 50% of the time at work   Time  4   Period Weeks   Status New           PT Long Term Goals - 06/25/15 1045    PT LONG TERM GOAL #1   Title be independent in advanced HEP   Time 8   Period Weeks   Status New   PT LONG TERM GOAL #2   Title reduce FOTO to < or = to 36% limitation   Time 8   Period Weeks   Status New   PT LONG TERM GOAL #3   Title report a 60% reduction in Lt UE pain at work   Time 8   Period Weeks   Status New   PT LONG TERM GOAL #4   Title reduce Lt UE pain to allow to sit at work for 1 hour without limitation   Time 8   Period Weeks   Status New               Plan - 06/25/15 1139    Clinical Impression Statement Pt presents to PT with Lt UE pain of a chronic nature that flared up 05/2015.  Pt's problem began >2 years ago when she got a new chair at work.  Pain was resolved with PT at that time.  Pain began to increase again about a month ago.  Pt demonstrates normal cervical AROM and UE strength.  Pt with bil. neck tension, postural dysfunction, limited segemental mobility and FOTO score of 51% limitation.  Pt reports 9/10 Lt UE pain when at work and is limited to sitting in her work chair for < 30 minutes at a time.  Pt will benefit from skilled PT for manual therapy, traction, modalities (no [place]), cervical AROM and postural strength.     Pt will benefit from skilled therapeutic intervention in order to improve on the following deficits Postural dysfunction;Improper body mechanics;Pain;Decreased activity tolerance;Increased muscle spasms   Rehab Potential Good   PT Frequency 2x / week   PT Duration 8 weeks   PT Treatment/Interventions ADLs/Self Care Home Management;Cryotherapy;Electrical Stimulation;Moist Heat;Traction;Functional mobility  training;Therapeutic activities;Therapeutic exercise;Manual techniques;Patient/family education;Neuromuscular re-education;Passive range of motion;Taping;Dry needling   PT Next Visit Plan Soft tissue to bil. neck/UT, traction if helpful, review neck stretches, add scapular strength to HEP   Consulted and Agree with Plan of Care Patient         Problem List Patient Active Problem List   Diagnosis Date Noted  . Hyperlipidemia 03/01/2014  . Dizziness 09/04/2013  . Vertigo 09/04/2013  . CAD of autologous bypass graft 02/07/2012  . Old MI (myocardial infarction) 02/07/2012  . Obesity (BMI 35.0-39.9 without comorbidity) ([place]) 01/25/2012  . Hypertension 01/25/2012  . S/P CABG x 2 01/24/2012  . URI, ACUTE 01/30/2008  . CARCINOMA, BREAST, RIGHT, 1998 12/14/2007  . ASTHMA 12/14/2007    Carolyn Fleming, PT 06/25/2015, 11:57 AM  [facility] [address], [place], [place], [address] Phone: [phone]   Fax:  [phone]  Name: Carolyn Fleming MRN: [id number] Date of Birth: [birth year]

## 2015-06-25 NOTE — Patient Instructions (Signed)
PERFORM ALL EXERCISES GENTLY AND WITH GOOD POSTURE.    20 SECOND HOLD, 3 REPS TO EACH SIDE. 4-5 TIMES EACH DAY.   AROM: Neck Rotation   Turn head slowly to look over one shoulder, then the other.   AROM: Neck Flexion   Bend head forward.   AROM: Lateral Neck Flexion   Slowly tilt head toward one shoulder, then the other.    Copyright  VHI. All rights reserved.  Scapular Retraction (Standing)   With arms at sides, pinch shoulder blades together. Repeat _5___ times per set. Do _many___ sessions per day.  Marland Kitchen Posture - Standing   Good posture is important. Avoid slouching and forward head thrust. Maintain curve in low back and align ears over shoulders, hips over ankles.  Pull your belly button in toward your back bone. Posture Tips DO: - stand tall and erect - keep chin tucked in - keep head and shoulders in alignment - check posture regularly in mirror or large window - pull head back against headrest in car seat;  Change your position often.  Sit with lumbar support. DON'T: - slouch or slump while watching TV or reading - sit, stand or lie in one position  for too long;  Sitting is especially hard on the spine so if you sit at a desk/use the computer, then stand up often! Copyright  VHI. All rights reserved.  Posture - Sitting  Sit upright, head facing forward. Try using a roll to support lower back. Keep shoulders relaxed, and avoid rounded back. Keep hips level with knees. Avoid crossing legs for long periods. Copyright  VHI. All rights reserved.  Chronic neck strain can develop because of poor posture and faulty work habits  Postural strain related to slumped sitting and forward head posture is a leading cause of headaches, neck and upper back pain  General strengthening and flexibility exercises are helpful in the treatment of neck pain.  Most importantly, you should learn to correct the posture that may be contributing to chronic pain.   Change positions  frequently  Change your work or home environment to improve posture and mechanics.   [facility] [address] [place], [place] [address] Phone # [phone] Fax [phone]

## 2015-06-26 ENCOUNTER — Ambulatory Visit: Payer: BLUE CROSS/BLUE SHIELD | Admitting: Physical Therapy

## 2015-06-26 ENCOUNTER — Encounter: Payer: Self-pay | Admitting: Physical Therapy

## 2015-06-26 DIAGNOSIS — M79602 Pain in left arm: Secondary | ICD-10-CM

## 2015-06-26 DIAGNOSIS — R293 Abnormal posture: Secondary | ICD-10-CM

## 2015-06-26 NOTE — Patient Instructions (Addendum)
          Copyright  VHI. All rights reserved.  Flexibility: Neck Retraction    Pull head straight back, keeping eyes on the floor. Repeat _10___ times per set. Do _2___ sets per session. Do many____ sessions per day.  http://orth.exer.us/344   Copyright  VHI. All rights reserved.

## 2015-06-26 NOTE — Therapy (Signed)
[facility] [address] [place], [place], [address] Phone: [phone]   Fax:  [phone]  Physical Therapy Treatment  Patient Details  Name: Carolyn Fleming MRN: [id number] Date of Birth: August 11, 1951 Referring Provider: Jonathon Jordan, MD  Encounter Date: 06/26/2015      PT End of Session - 06/26/15 1311    Visit Number 2   Date for PT Re-Evaluation 08/20/15   PT Start Time 1229   PT Stop Time 1320   PT Time Calculation (min) 51 min   Activity Tolerance Patient tolerated treatment well   Behavior During Therapy [facility] for tasks assessed/performed      Past Medical History  Diagnosis Date  . Asthma   . Arthritis   . Sleep apnea     uses cpap  . Breast cancer ([place])   . GERD (gastroesophageal reflux disease)   . Hyperlipidemia   . S/P CABG x 2 01/24/2012    Emergency LIMA to LAD, SVG to OM1, EVH via right thigh  . Obesity (BMI 35.0-39.9 without comorbidity) ([place]) 01/25/2012  . Hypertension 01/25/2012    Past Surgical History  Procedure Laterality Date  . Breast lumpectomy  1997    breast cancer  . Tubal ligation    . Abdominal hysterectomy    . Tonsillectomy    . Coronary artery bypass graft  01/24/2012    Procedure: CORONARY ARTERY BYPASS GRAFTING (CABG);  Surgeon: [name], MD;  Location: [place];  Service: Open Heart Surgery;  Laterality: N/A;  Coronary Artery bypass Graft times two utilizing the left internal mammary artery and the right greater saphenous vein harvested endoscopically,  Transesophageal Echocardiogram  . Left heart catheterization with coronary angiogram N/A 01/24/2012    Procedure: LEFT HEART CATHETERIZATION WITH CORONARY ANGIOGRAM;  Surgeon: [name], MD;  Location: [facility] CATH LAB;  Service: Cardiovascular;  Laterality: N/A;  . Cardiac catheterization N/A 05/07/2015    Procedure: Left Heart Cath and Cors/Grafts Angiography;  Surgeon: [name], MD;  Location: [place] CV  LAB;  Service: Cardiovascular;  Laterality: N/A;    There were no vitals filed for this visit.  Visit Diagnosis:  Arm pain, left  Posture abnormality      Subjective Assessment - 06/26/15 1228    Subjective Pt reports her pain in left arm is rated as 7/10.    Pertinent History history of Rt breast cancer- no [place] or arm bike, Pt works at Engineer, [employer] at [employer].    Currently in Pain? Yes   Pain Score 7    Pain Location Arm   Pain Orientation Left   Pain Descriptors / Indicators Throbbing   Pain Type Acute pain   Pain Onset More than a month ago   Pain Frequency Constant   Multiple Pain Sites No                         [facility] Adult PT Treatment/Exercise - 06/26/15 0001    Posture/Postural Control   Posture/Postural Control Postural limitations   Postural Limitations Rounded Shoulders;Forward head   Exercises   Exercises Neck;Shoulder   Neck Exercises: Seated   Neck Retraction 10 reps;3 secs  in sitting   Cervical Rotation Both  20sec x 3 each side   Lateral Flexion Both  20sec x 3 each side   Other Seated Exercise flexion x 3 with 20 sec hold   Neck Exercises: Supine   Other Supine  Exercise Foam roll x 2 min with lLt UE supported on pillows   Other Supine Exercise attempted selfmob thoracic, cervical but unable to tolerate due to incr of pain in Rt UE   Shoulder Exercises: Seated   Retraction Strengthening;10 reps  with3 sec hold   Shoulder Exercises: Pulleys   Flexion 3 minutes   Modalities   Modalities Traction   Traction   Type of Traction Cervical   Min (lbs) 5   Max (lbs) 15   Hold Time 60   Rest Time 10   Time 15                PT Education - 06/26/15 1311    Education provided Yes   Education Details cervical retraction in sitting   Person(s) Educated Patient   Methods Explanation;Demonstration   Comprehension Verbalized understanding;Returned demonstration          PT Short Term Goals - 06/25/15 1144    PT SHORT TERM  GOAL #1   Title be independent in initial HEP   Time 4   Period Weeks   Status New   PT SHORT TERM GOAL #2   Title report a 25% reduction in Lt UE pain with sitting at work   Time 4   Period Weeks   Status New   PT SHORT TERM GOAL #3   Title sit with neutral seated posture at least 50% of the time at work   Time 4   Period Weeks   Status New           PT Long Term Goals - 06/25/15 1045    PT [place] #1   Title be independent in advanced HEP   Time 8   Period Weeks   Status New   PT LONG TERM GOAL #2   Title reduce FOTO to < or = to 36% limitation   Time 8   Period Weeks   Status New   PT LONG TERM GOAL #3   Title report a 60% reduction in Lt UE pain at work   Time 8   Period Weeks   Status New   PT LONG TERM GOAL #4   Title reduce Lt UE pain to allow to sit at work for 1 hour without limitation   Time 8   Period Weeks   Status New               Plan - 06/26/15 1312    Clinical Impression Statement Pt complains of pain rated as 7/10 up to 9/10, unable to perform selfmob with towelroo with left UE supported due to incr of pain, able to tolerate faomroll for elongation & decompresssion, pt very tight in bil cervical paraspinals, SCM, scalenies   Pt will benefit from skilled therapeutic intervention in order to improve on the following deficits Postural dysfunction;Improper body mechanics;Pain;Decreased activity tolerance;Increased muscle spasms   Rehab Potential Good   PT Frequency 2x / week   PT Duration 8 weeks   PT Treatment/Interventions ADLs/Self Care Home Management;Cryotherapy;Electrical Stimulation;Moist Heat;Traction;Functional mobility training;Therapeutic activities;Therapeutic exercise;Manual techniques;Patient/family education;Neuromuscular re-education;Passive range of motion;Taping;Dry needling   PT Next Visit Plan Soft tissue to bil. neck/UT, continue traction, review neck stretches and neck/shoulder retraction, add scapular strength to  HEP, Foam roll for elongation   Consulted and Agree with Plan of Care Patient        Problem List Patient Active Problem List   Diagnosis Date Noted  . Hyperlipidemia 03/01/2014  . Dizziness 09/04/2013  .  Vertigo 09/04/2013  . CAD of autologous bypass graft 02/07/2012  . Old MI (myocardial infarction) 02/07/2012  . Obesity (BMI 35.0-39.9 without comorbidity) ([place]) 01/25/2012  . Hypertension 01/25/2012  . S/P CABG x 2 01/24/2012  . URI, ACUTE 01/30/2008  . CARCINOMA, BREAST, RIGHT, 1998 12/14/2007  . ASTHMA 12/14/2007    NAUMANN-HOUEGNIFIO,Mionna Advincula PTA 06/26/2015, 1:31 PM  [facility] [address] [place], [place], [address] Phone: [phone]   Fax:  [phone]  Name: Carolyn Fleming MRN: [id number] Date of Birth: 1950-10-19

## 2015-07-02 ENCOUNTER — Ambulatory Visit: Payer: BLUE CROSS/BLUE SHIELD | Admitting: Physical Therapy

## 2015-07-02 ENCOUNTER — Encounter: Payer: Self-pay | Admitting: Physical Therapy

## 2015-07-02 DIAGNOSIS — R293 Abnormal posture: Secondary | ICD-10-CM

## 2015-07-02 DIAGNOSIS — M79602 Pain in left arm: Secondary | ICD-10-CM | POA: Diagnosis not present

## 2015-07-02 NOTE — Patient Instructions (Signed)
Soft foam roll from [employer]: either blue or pink in color.  Lay on top of roll head to toe. Relax and breathe, knees are bent and arms are relaxed by side. 2-5 min. Soften your body.  Neck: Will add  Upper back:  Will add

## 2015-07-02 NOTE — Therapy (Addendum)
[facility] [address] [place], [place], [address] Phone: [phone]   Fax:  [phone]  Physical Therapy Treatment  Patient Details  Name: Carolyn Fleming MRN: [id number] Date of Birth: Nov 04, 1950 Referring Provider: Jonathon Jordan, MD  Encounter Date: 07/02/2015      PT End of Session - 07/02/15 1647    Visit Number 3   Date for PT Re-Evaluation 08/20/15   PT Start Time 1102   PT Stop Time 1650   PT Time Calculation (min) 35 min   Activity Tolerance Patient tolerated treatment well   Behavior During Therapy [facility] for tasks assessed/performed      Past Medical History  Diagnosis Date  . Asthma   . Arthritis   . Sleep apnea     uses cpap  . Breast cancer ([place])   . GERD (gastroesophageal reflux disease)   . Hyperlipidemia   . S/P CABG x 2 01/24/2012    Emergency LIMA to LAD, SVG to OM1, EVH via right thigh  . Obesity (BMI 35.0-39.9 without comorbidity) ([place]) 01/25/2012  . Hypertension 01/25/2012    Past Surgical History  Procedure Laterality Date  . Breast lumpectomy  1997    breast cancer  . Tubal ligation    . Abdominal hysterectomy    . Tonsillectomy    . Coronary artery bypass graft  01/24/2012    Procedure: CORONARY ARTERY BYPASS GRAFTING (CABG);  Surgeon: [name], MD;  Location: [place];  Service: Open Heart Surgery;  Laterality: N/A;  Coronary Artery bypass Graft times two utilizing the left internal mammary artery and the right greater saphenous vein harvested endoscopically,  Transesophageal Echocardiogram  . Left heart catheterization with coronary angiogram N/A 01/24/2012    Procedure: LEFT HEART CATHETERIZATION WITH CORONARY ANGIOGRAM;  Surgeon: [name], MD;  Location: [facility] CATH LAB;  Service: Cardiovascular;  Laterality: N/A;  . Cardiac catheterization N/A 05/07/2015    Procedure: Left Heart Cath and Cors/Grafts Angiography;  Surgeon: [name], MD;  Location: [place] CV  LAB;  Service: Cardiovascular;  Laterality: N/A;    There were no vitals filed for this visit.  Visit Diagnosis:  Arm pain, left  Posture abnormality      Subjective Assessment - 07/02/15 1619    Subjective I feel like the chair at my work is what aggrevates my upper back muscle.    Currently in Pain? Yes   Pain Score 6    Pain Location Scapula   Pain Orientation Left   Pain Descriptors / Indicators Aching   Aggravating Factors  her chair   Pain Relieving Factors change of positions   Multiple Pain Sites No                         [facility] Adult PT Treatment/Exercise - 07/02/15 0001    Neck Exercises: Supine   Other Supine Exercise Foam roll x 2 min with lLt UE: no pillows needed today.   Other Supine Exercise Cervicaland thoracic  release protocolon foam roll.                   PT Short Term Goals - 07/02/15 1621    PT SHORT TERM GOAL #1   Title be independent in initial HEP   Time 4   Period Weeks   Status Achieved   PT SHORT TERM GOAL #2   Title report a 25% reduction in Lt UE pain with  sitting at work   Time 4   Period Weeks   Status On-going  Still early   PT SHORT TERM GOAL #3   Title sit with neutral seated posture at least 50% of the time at work   Time 4   Period Weeks   Status On-going  Pt leans RT for relief and at edge of chair           PT Long Term Goals - 06/25/15 1045    PT LONG TERM GOAL #1   Title be independent in advanced HEP   Time 8   Period Weeks   Status New   PT LONG TERM GOAL #2   Title reduce FOTO to < or = to 36% limitation   Time 8   Period Weeks   Status New   PT LONG TERM GOAL #3   Title report a 60% reduction in Lt UE pain at work   Time 8   Period Weeks   Status New   PT LONG TERM GOAL #4   Title reduce Lt UE pain to allow to sit at work for 1 hour without limitation   Time 8   Period Weeks   Status New               Plan - 07/02/15 1648    Clinical Impression Statement pt  responded great to fascial lengthening protocol on th esoft foam roll for cervical and thoracic areas. It abolished her pain. Patient reported she was going to but a foam roll for home use. She is also awaiting a new chair at work.    Pt will benefit from skilled therapeutic intervention in order to improve on the following deficits Postural dysfunction;Improper body mechanics;Pain;Decreased activity tolerance;Increased muscle spasms   Rehab Potential Good   PT Frequency 2x / week   PT Duration 8 weeks   PT Treatment/Interventions ADLs/Self Care Home Management;Cryotherapy;Electrical Stimulation;Moist Heat;Traction;Functional mobility training;Therapeutic activities;Therapeutic exercise;Manual techniques;Patient/family education;Neuromuscular re-education;Passive range of motion;Taping;Dry needling   PT Next Visit Plan See how long she felt better, did she buy roll? Continue with fascial lengthening and rehydration on foam roll.    Consulted and Agree with Plan of Care Patient        Problem List Patient Active Problem List   Diagnosis Date Noted  . Hyperlipidemia 03/01/2014  . Dizziness 09/04/2013  . Vertigo 09/04/2013  . CAD of autologous bypass graft 02/07/2012  . Old MI (myocardial infarction) 02/07/2012  . Obesity (BMI 35.0-39.9 without comorbidity) ([place]) 01/25/2012  . Hypertension 01/25/2012  . S/P CABG x 2 01/24/2012  . URI, ACUTE 01/30/2008  . CARCINOMA, BREAST, RIGHT, 1998 12/14/2007  . ASTHMA 12/14/2007    Givanni Staron, PTA 07/02/2015, 4:51 PM  [facility] [address] [place], [place], [address] Phone: [phone]   Fax:  [phone]  Name: Carolyn Fleming MRN: [id number] Date of Birth: 02-18-1951   Patient declined traction today.  PHYSICAL THERAPY DISCHARGE SUMMARY  Visits from Start of Care: 3 Current functional level related to goals / functional outcomes: See above for most current PT  status.  Pt didn't return to PT.    Remaining deficits: See above.     Education / Equipment: HEP, posture Plan: Patient agrees to discharge.  Patient goals were partially met. Patient is being discharged due to being pleased with the current functional level.  ?????   Sigurd Sos, PT 09/10/2015 9:52 AM

## 2015-07-03 ENCOUNTER — Ambulatory Visit: Payer: BLUE CROSS/BLUE SHIELD

## 2015-07-03 ENCOUNTER — Ambulatory Visit
Admission: RE | Admit: 2015-07-03 | Discharge: 2015-07-03 | Disposition: A | Discharge status: Home / Self Care | Payer: BLUE CROSS/BLUE SHIELD | Source: Ambulatory Visit

## 2015-07-03 DIAGNOSIS — Z1231 Encounter for screening mammogram for malignant neoplasm of breast: Secondary | ICD-10-CM

## 2015-07-04 ENCOUNTER — Other Ambulatory Visit: Payer: Self-pay | Admitting: Obstetrics & Gynecology

## 2015-07-04 DIAGNOSIS — R928 Other abnormal and inconclusive findings on diagnostic imaging of breast: Secondary | ICD-10-CM

## 2015-07-08 ENCOUNTER — Ambulatory Visit: Payer: BLUE CROSS/BLUE SHIELD | Admitting: Physical Therapy

## 2015-07-15 ENCOUNTER — Encounter: Payer: BLUE CROSS/BLUE SHIELD | Admitting: Rehabilitation

## 2015-07-17 ENCOUNTER — Ambulatory Visit
Admission: RE | Admit: 2015-07-17 | Discharge: 2015-07-17 | Disposition: A | Discharge status: Home / Self Care | Payer: BLUE CROSS/BLUE SHIELD | Source: Ambulatory Visit | Attending: Obstetrics & Gynecology | Admitting: Obstetrics & Gynecology

## 2015-07-17 DIAGNOSIS — R928 Other abnormal and inconclusive findings on diagnostic imaging of breast: Secondary | ICD-10-CM

## 2015-11-18 ENCOUNTER — Other Ambulatory Visit: Payer: Self-pay

## 2015-11-18 DIAGNOSIS — Z1231 Encounter for screening mammogram for malignant neoplasm of breast: Secondary | ICD-10-CM

## 2015-12-12 ENCOUNTER — Encounter: Payer: Self-pay | Admitting: Interventional Cardiology

## 2015-12-12 ENCOUNTER — Ambulatory Visit (INDEPENDENT_AMBULATORY_CARE_PROVIDER_SITE_OTHER): Payer: BLUE CROSS/BLUE SHIELD | Admitting: Interventional Cardiology

## 2015-12-12 VITALS — BP 130/80 | HR 76 | Ht 62.0 in | Wt 187.6 lb

## 2015-12-12 DIAGNOSIS — I252 Old myocardial infarction: Secondary | ICD-10-CM | POA: Diagnosis not present

## 2015-12-12 DIAGNOSIS — I25810 Atherosclerosis of coronary artery bypass graft(s) without angina pectoris: Secondary | ICD-10-CM | POA: Diagnosis not present

## 2015-12-12 DIAGNOSIS — I10 Essential (primary) hypertension: Secondary | ICD-10-CM

## 2015-12-12 DIAGNOSIS — Z79899 Other long term (current) drug therapy: Secondary | ICD-10-CM

## 2015-12-12 DIAGNOSIS — E785 Hyperlipidemia, unspecified: Secondary | ICD-10-CM

## 2015-12-12 LAB — HEPATIC FUNCTION PANEL
ALT: 23 U/L (ref 6–29)
AST: 21 U/L (ref 10–35)
Albumin: 3.9 g/dL (ref 3.6–5.1)
Alkaline Phosphatase: 73 U/L (ref 33–130)
BILIRUBIN DIRECT: 0.1 mg/dL (ref <=0.2)
Indirect Bilirubin: 0.4 mg/dL (ref 0.2–1.2)
TOTAL PROTEIN: 6.7 g/dL (ref 6.1–8.1)
Total Bilirubin: 0.5 mg/dL (ref 0.2–1.2)

## 2015-12-12 LAB — LIPID PANEL
CHOL/HDL RATIO: 4.4 ratio (ref <=5.0)
CHOLESTEROL: 163 mg/dL (ref 125–200)
HDL: 37 mg/dL — ABNORMAL LOW (ref >=46)
LDL CALC: 97 mg/dL (ref <130)
TRIGLYCERIDES: 146 mg/dL (ref <150)
VLDL: 29 mg/dL (ref <30)

## 2015-12-12 NOTE — Patient Instructions (Addendum)
Medication Instructions:  Your physician recommends that you continue on your current medications as directed. Please refer to the Current Medication list given to you today.  Labwork: Today: Liver panel and Hepatic function panel  Testing/Procedures: None ordered  Follow-Up: Your physician wants you to follow-up in: 1 year with Dr. [name]. You will receive a reminder letter in the mail two months in advance. If you don't receive a letter, please call our office to schedule the follow-up appointment.  Any Other Special Instructions Will Be Listed Below (If Applicable). -- Stay Active --  If you need a refill on your cardiac medications before your next appointment, please call your pharmacy.  Thank you for choosing [facility]!!

## 2015-12-12 NOTE — Progress Notes (Signed)
Cardiology Office Note   Date:  12/12/2015   ID:  Carolyn Fleming, DOB [birth year], MRN [id number]  PCP:  Lilian Coma, MD  Cardiologist:  [name], MD   Chief Complaint  Patient presents with  . Coronary Artery Disease      History of Present Illness: Carolyn Fleming is a 65 y.o. female who presents for CAD, prior anterior non-ST elevation MI, coronary bypass grafting, hypertension, hyperlipidemia and BMI 35.  Carolyn Fleming is doing well. She denies angina. There is mild claudication in the right calf. This is not limiting.  Past Medical History  Diagnosis Date  . Asthma   . Arthritis   . Sleep apnea     uses cpap  . Breast cancer ([place])   . GERD (gastroesophageal reflux disease)   . Hyperlipidemia   . S/P CABG x 2 01/24/2012    Emergency LIMA to LAD, SVG to OM1, EVH via right thigh  . Obesity (BMI 35.0-39.9 without comorbidity) ([place]) 01/25/2012  . Hypertension 01/25/2012    Past Surgical History  Procedure Laterality Date  . Breast lumpectomy  1997    breast cancer  . Tubal ligation    . Abdominal hysterectomy    . Tonsillectomy    . Coronary artery bypass graft  01/24/2012    Procedure: CORONARY ARTERY BYPASS GRAFTING (CABG);  Surgeon: [name], MD;  Location: [place];  Service: Open Heart Surgery;  Laterality: N/A;  Coronary Artery bypass Graft times two utilizing the left internal mammary artery and the right greater saphenous vein harvested endoscopically,  Transesophageal Echocardiogram  . Left heart catheterization with coronary angiogram N/A 01/24/2012    Procedure: LEFT HEART CATHETERIZATION WITH CORONARY ANGIOGRAM;  Surgeon: [name], MD;  Location: [facility] CATH LAB;  Service: Cardiovascular;  Laterality: N/A;  . Cardiac catheterization N/A 05/07/2015    Procedure: Left Heart Cath and Cors/Grafts Angiography;  Surgeon: [name], MD;  Location: [place] CV LAB;  Service: Cardiovascular;  Laterality: N/A;     Current Outpatient  Prescriptions  Medication Sig Dispense Refill  . ALPRAZolam (XANAX) 0.5 MG tablet Take 0.5 mg by mouth at bedtime as needed. sleep  0  . amLODipine (NORVASC) 5 MG tablet Take 1 tablet (5 mg total) by mouth daily. 90 tablet 2  . aspirin EC 81 MG tablet Take 81 mg by mouth daily.    Marland Kitchen atorvastatin (LIPITOR) 10 MG tablet Take 10 mg by mouth as directed. Take 10mg  by mouth on Mondays, Wednesdays, and Fridays    . bimatoprost (LUMIGAN) 0.01 % SOLN Place 1 drop into both eyes at bedtime.    . brimonidine (ALPHAGAN) 0.15 % ophthalmic solution Place 1 drop into both eyes 2 (two) times daily.    . dorzolamide (TRUSOPT) 2 % ophthalmic solution Place 1 drop into both eyes 2 (two) times daily.     Marland Kitchen loratadine (CLARITIN) 10 MG tablet Take 10 mg by mouth daily.    Marland Kitchen losartan-hydrochlorothiazide (HYZAAR) 50-12.5 MG tablet Take 1 tablet by mouth daily.  0  . meclizine (ANTIVERT) 12.5 MG tablet Take 1 tablet (12.5 mg total) by mouth 3 (three) times daily as needed for dizziness. 90 tablet 0  . NITROSTAT 0.4 MG SL tablet Place 1 tablet under the tongue every 5 (five) minutes as needed for chest pain.   0  . timolol (BETIMOL) 0.25 % ophthalmic solution Place 1 drop into both eyes every morning.      No current facility-administered  medications for this visit.    Allergies:   Codeine; Latex; and Sulfonamide derivatives    Social History:  The patient  reports that she has never smoked. She has never used smokeless tobacco. She reports that she does not drink alcohol or use illicit drugs.   Family History:  The patient's family history includes Cancer in her brother and mother.    ROS:  Please see the history of present illness.   Otherwise, review of systems are positive for Difficulty sleeping but otherwise unremarkable.   All other systems are reviewed and negative.    PHYSICAL EXAM: VS:  BP 130/80 mmHg  Pulse 76  Ht 5\' 2"  (1.575 m)  Wt 187 lb 9.6 oz (85.095 kg)  BMI 34.30 kg/m2 , BMI Body mass  index is 34.3 kg/(m^2). GEN: Well nourished, well developed, in no acute distress HEENT: normal Neck: no JVD, carotid bruits, or masses Cardiac: RRR.  There is no murmur, rub, or gallop. There is no edema. Respiratory:  clear to auscultation bilaterally, normal work of breathing. GI: soft, nontender, nondistended, + BS MS: no deformity or atrophy Skin: warm and dry, no rash Neuro:  Strength and sensation are intact Psych: euthymic mood, full affect   EKG:  EKG is not ordered today   Recent Labs: 03/12/2015: ALT 19 05/02/2015: BUN 20; Creatinine, Ser 1.24*; Hemoglobin 14.1; Platelets 219.0; Potassium 3.8; Sodium 139    Lipid Panel    Component Value Date/Time   CHOL 182 03/12/2015 0837   TRIG 120.0 03/12/2015 0837   HDL 35.60* 03/12/2015 0837   CHOLHDL 5 03/12/2015 0837   VLDL 24.0 03/12/2015 0837   LDLCALC 122* 03/12/2015 0837   LDLDIRECT 149.1 05/28/2013 0946      Wt Readings from Last 3 Encounters:  12/12/15 187 lb 9.6 oz (85.095 kg)  05/19/15 180 lb 1.9 oz (81.702 kg)  05/07/15 183 lb (83.008 kg)      Other studies Reviewed: Additional studies/ records that were reviewed today include: Lab work drawn on last visit. The findings include creatinine 1.24. Last LDL cholesterol 122 in July 2016 on no antilipid therapy. She is now on 10 mg of atorvastatin.    ASSESSMENT AND PLAN:  1. CAD of autologous bypass graft Stable without angina  2. Essential hypertension Excellent control  3. Hyperlipidemia Last evaluated in July 2016  4. Old MI (myocardial infarction) LV function is normal on last assessment during heart catheterization in 2016    Current medicines are reviewed at length with the patient today.  The patient has the following concerns regarding medicines: None.  The following changes/actions have been instituted:    Okay to use melatonin for sleep  Aerobic activity is encouraged  Needs liver and lipid panel in July 17  Labs/ tests ordered  today include:  No orders of the defined types were placed in this encounter.     Disposition:   FU with HS in 1 year  Signed, [name], MD  12/12/2015 7:56 AM    [facility] [place], [place], [place]  [address] Phone: [phone]; Fax: [phone]

## 2015-12-19 ENCOUNTER — Telehealth: Payer: Self-pay | Admitting: Interventional Cardiology

## 2015-12-19 ENCOUNTER — Encounter: Payer: Self-pay | Admitting: *Deleted

## 2015-12-19 NOTE — Telephone Encounter (Signed)
Labs placed in mail for pt.

## 2015-12-19 NOTE — Telephone Encounter (Signed)
Informed pt of lab results. Pt verbalized understanding. 

## 2015-12-19 NOTE — Telephone Encounter (Signed)
F/u  Pt discussed lab results w/ RN today- wanted to have results mailed to her listed address as well. Pt did not need call back

## 2015-12-19 NOTE — Telephone Encounter (Signed)
°  Pt returned call and advised can leave messages on her home phone number.  Please give her a call at work.

## 2016-01-19 ENCOUNTER — Other Ambulatory Visit: Payer: Self-pay | Admitting: Interventional Cardiology

## 2016-01-19 MED ORDER — LOSARTAN POTASSIUM-HCTZ 50-12.5 MG PO TABS: 1.0000 | ORAL_TABLET | Freq: Every day | ORAL | Status: DC

## 2016-01-19 MED ORDER — AMLODIPINE BESYLATE 5 MG PO TABS: 5.0000 mg | ORAL_TABLET | Freq: Every day | ORAL | Status: DC

## 2016-05-27 ENCOUNTER — Other Ambulatory Visit: Payer: Self-pay | Admitting: Interventional Cardiology

## 2016-05-27 ENCOUNTER — Telehealth: Payer: Self-pay

## 2016-05-27 ENCOUNTER — Other Ambulatory Visit: Payer: Self-pay

## 2016-05-27 ENCOUNTER — Telehealth: Payer: Self-pay | Admitting: Interventional Cardiology

## 2016-05-27 MED ORDER — ATORVASTATIN CALCIUM 10 MG PO TABS: 10.0000 mg | ORAL_TABLET | ORAL | 5 refills | Status: DC

## 2016-05-27 NOTE — Telephone Encounter (Signed)
Follow up       *STAT* If patient is at the pharmacy, call can be transferred to refill team.   1. Which medications need to be refilled? (please list name of each medication and dose if known) atorvastatin 20mg  2. Which pharmacy/location (including street and city if local pharmacy) is medication to be sent to? [facility] [address]. Do they need a 30 day or 90 day supply? 90 day supply with the directions stating 1 pill daily.  Pt went to pick up presc but the directions was 1 pill on mon, wed, fri and the pharmacy would only give her 30 pills.  Pt states Dr Thompson Caul nurse always gave her 90 pills and the directions was 1 pill daily.  She would take the medication daily or 3 times a week depending on her tolerance to the medication and muscle pain.  Pt did not get presc.  Please recall it in so that she can get 90 tablets---1 pill daily.  If any problems, please call

## 2016-05-27 NOTE — Telephone Encounter (Signed)
Pt states she has been using Flonase and it is not working.  Spoke with pt about addictive qualities with Afrin.  Pt does not wish to use Afrin either then.  She would like for Dr. [name] to recommend a nasal spray that would be ok to use along with her cardiac meds.  Advised pt I will send message to Dr. [name] for review.

## 2016-05-27 NOTE — Telephone Encounter (Signed)
Called pt to inform her that her Atorvastatin in 10 mg tablet, base on her last OV and that Dr. [name] has already written it for Mondays, Wednesday and Friday, 3 times a week, dispensing 15 tablets. I offered to send a message to Dr. [name], but pt decline. I advised the pt that if she has any other problems, questions or concerns to call the office. Pt verbalized understanding.

## 2016-05-27 NOTE — Telephone Encounter (Signed)
Called pharmacy to discontinue the 20 mg atorvastatin completely. Sent in the atorvastatin 10 mg as prescribed.

## 2016-05-27 NOTE — Telephone Encounter (Signed)
New Message:   Pt says she can not use Flonase,can she use Afrin or something else he recommends.

## 2016-06-20 DIAGNOSIS — H401122 Primary open-angle glaucoma, left eye, moderate stage: Secondary | ICD-10-CM | POA: Insufficient documentation

## 2016-06-20 DIAGNOSIS — H401113 Primary open-angle glaucoma, right eye, severe stage: Secondary | ICD-10-CM | POA: Insufficient documentation

## 2016-07-05 ENCOUNTER — Ambulatory Visit
Admission: RE | Admit: 2016-07-05 | Discharge: 2016-07-05 | Disposition: A | Discharge status: Home / Self Care | Payer: PRIVATE HEALTH INSURANCE | Source: Ambulatory Visit

## 2016-07-05 DIAGNOSIS — Z1231 Encounter for screening mammogram for malignant neoplasm of breast: Secondary | ICD-10-CM

## 2016-10-13 ENCOUNTER — Other Ambulatory Visit: Payer: Self-pay | Admitting: Interventional Cardiology

## 2016-11-12 ENCOUNTER — Encounter: Payer: Self-pay | Admitting: *Deleted

## 2016-11-15 ENCOUNTER — Other Ambulatory Visit: Payer: Self-pay | Admitting: Family Medicine

## 2016-11-15 ENCOUNTER — Ambulatory Visit
Admission: RE | Admit: 2016-11-15 | Discharge: 2016-11-15 | Disposition: A | Discharge status: Home / Self Care | Payer: PRIVATE HEALTH INSURANCE | Source: Ambulatory Visit | Attending: Family Medicine | Admitting: Family Medicine

## 2016-11-15 DIAGNOSIS — M5431 Sciatica, right side: Secondary | ICD-10-CM

## 2016-11-18 ENCOUNTER — Telehealth: Payer: Self-pay | Admitting: Interventional Cardiology

## 2016-11-18 NOTE — Telephone Encounter (Signed)
Follow Up:; ° ° °Returning your call. °

## 2016-11-18 NOTE — Telephone Encounter (Signed)
New message     What can the pt take for pt that will not mess with her medication , she can not take the Tramadol at work

## 2016-11-18 NOTE — Telephone Encounter (Signed)
It would be okay to use ibuprofen or Aleve, short-term over the next 6-8 weeks if needed.

## 2016-11-18 NOTE — Telephone Encounter (Signed)
Pt has been dealing with severe back pain over the last several weeks.  MRI showed severe arthritis in lower back.  PCP prescribed Tramadol but pt is unable to take this during the day d/t side effects.  Pt wants to know if she can take Ibuprofen or Aleve short term to help with back pain, or what narcotic pain medication is safe for her to take with her cardiac medications and her condition?  Pt can not tolerate Hydrocodone or anything with Codeine in it.  Pt aware that Dr. [name] will not prescribe the medication but only make recommendations.  Will route to Dr. [name] for review and advisement.

## 2016-11-18 NOTE — Telephone Encounter (Signed)
Informed pt of recommendations per Dr. [name].  Pt verbalized understanding and was appreciative for call.

## 2016-11-18 NOTE — Telephone Encounter (Signed)
Left message to call back  

## 2016-11-24 ENCOUNTER — Telehealth: Payer: Self-pay | Admitting: Interventional Cardiology

## 2016-11-24 ENCOUNTER — Ambulatory Visit: Payer: PRIVATE HEALTH INSURANCE | Admitting: Interventional Cardiology

## 2016-11-24 ENCOUNTER — Other Ambulatory Visit: Payer: Self-pay | Admitting: Interventional Cardiology

## 2016-11-24 NOTE — Telephone Encounter (Signed)
Will route to Dr. [name] for approval for pt to wear compression stockings.

## 2016-11-24 NOTE — Telephone Encounter (Signed)
Spoke with pt and went over recommendations per Dr. [name].  Pt will plan to hold off for now and see Dr. [name] on the 16th as planned before using compressions stockings.  Pt appreciative for call.

## 2016-11-24 NOTE — Telephone Encounter (Signed)
New message      Pt has arthritis in her legs, left worse than right.  She want to know if she can wear compression stockings?  She has a little swelling but not a lot.  If yes, can she get Dr [name] to write a presc for it.  Pt has an appt with Dr [name] on 11-29-16. Please call

## 2016-11-24 NOTE — Telephone Encounter (Signed)
Compression stockings will be helpful if pain is related to swelling in the legs or venous insufficiency from varicose pathology. There is no problem with wearing compression stockings if this problem is present. Compression stockings will not help arthritis. We need more detail. Probably needs to see her PCP to sort out whether we are dealing with arthritis versus edema/venous disease versus PAD. We could do that here as well but she will need to be seen to make a differential.

## 2016-11-28 DIAGNOSIS — I739 Peripheral vascular disease, unspecified: Secondary | ICD-10-CM | POA: Insufficient documentation

## 2016-11-28 NOTE — Progress Notes (Signed)
Cardiology Office Note    Date:  11/29/2016   ID:  Carolyn Fleming, DOB 1951-02-17, MRN [id number]  PCP:  Lilian Coma, MD  Cardiologist: [name], MD   Chief Complaint  Patient presents with  . Coronary Artery Disease    History of Present Illness:  Carolyn Fleming is a 66 y.o. female who presents for CAD, prior anterior non-ST elevation MI, coronary bypass grafting, hypertension,abnormal right LE waveforms hyperlipidemia and BMI 35.  She is doing terribly because of insomnia, back discomfort, and left knee discomfort.  Despite the stress of lumbar disc symptoms and osteoarthritis, there are no cardiac complaints. She denies chest pain. No orthopnea, PND, syncope, or palpitations. No medication side effects.  Past Medical History:  Diagnosis Date  . Arthritis   . Asthma   . Breast cancer ([place])   . GERD (gastroesophageal reflux disease)   . Hyperlipidemia   . Hypertension 01/25/2012  . Obesity (BMI 35.0-39.9 without comorbidity) 01/25/2012  . S/P CABG x 2 01/24/2012   Emergency LIMA to LAD, SVG to OM1, EVH via right thigh  . Sleep apnea    uses cpap    Past Surgical History:  Procedure Laterality Date  . ABDOMINAL HYSTERECTOMY    . BREAST LUMPECTOMY  1997   breast cancer  . CARDIAC CATHETERIZATION N/A 05/07/2015   Procedure: Left Heart Cath and Cors/Grafts Angiography;  Surgeon: [name], MD;  Location: [facility];  Service: Cardiovascular;  Laterality: N/A;  . CORONARY ARTERY BYPASS GRAFT  01/24/2012   Procedure: CORONARY ARTERY BYPASS GRAFTING (CABG);  Surgeon: [name], MD;  Location: [facility];  Service: Open Heart Surgery;  Laterality: N/A;  Coronary Artery bypass Graft times two utilizing the left internal mammary artery and the right greater saphenous vein harvested endoscopically,  Transesophageal Echocardiogram  . LEFT HEART CATHETERIZATION WITH CORONARY ANGIOGRAM N/A 01/24/2012   Procedure: LEFT HEART CATHETERIZATION WITH CORONARY  ANGIOGRAM;  Surgeon: [name], MD;  Location: [facility];  Service: Cardiovascular;  Laterality: N/A;  . TONSILLECTOMY    . TUBAL LIGATION      Current Medications: Outpatient Medications Prior to Visit  Medication Sig Dispense Refill  . ALPRAZolam (XANAX) 0.5 MG tablet Take 0.5 mg by mouth at bedtime as needed. sleep  0  . amLODipine (NORVASC) 5 MG tablet TAKE 1 TABLET BY MOUTH EVERY DAY 90 tablet 0  . aspirin EC 81 MG tablet Take 81 mg by mouth daily.    Marland Kitchen atorvastatin (LIPITOR) 10 MG tablet Take 1 tablet (10 mg total) by mouth as directed. Take 10mg  by mouth on Mondays, Wednesdays, and Fridays 15 tablet 5  . bimatoprost (LUMIGAN) 0.01 % SOLN Place 1 drop into both eyes at bedtime.    . brimonidine (ALPHAGAN) 0.15 % ophthalmic solution Place 1 drop into both eyes 2 (two) times daily.    . dorzolamide (TRUSOPT) 2 % ophthalmic solution Place 1 drop into both eyes 2 (two) times daily.     Marland Kitchen loratadine (CLARITIN) 10 MG tablet Take 10 mg by mouth daily.    Marland Kitchen losartan-hydrochlorothiazide (HYZAAR) 50-12.5 MG tablet TAKE 1 TABLET BY MOUTH DAILY. 90 tablet 0  . meclizine (ANTIVERT) 12.5 MG tablet Take 1 tablet (12.5 mg total) by mouth 3 (three) times daily as needed for dizziness. 90 tablet 0  . NITROSTAT 0.4 MG SL tablet Place 1 tablet under the tongue every 5 (five) minutes as needed for chest pain.   0  .  timolol (BETIMOL) 0.25 % ophthalmic solution Place 1 drop into both eyes every morning.      No facility-administered medications prior to visit.      Allergies:   Codeine; Latex; and Sulfonamide derivatives   Social History   Social History  . Marital status: Divorced    Spouse name: N/A  . Number of children: N/A  . Years of education: N/A   Social History Main Topics  . Smoking status: Never Smoker  . Smokeless tobacco: Never Used  . Alcohol use No  . Drug use: No  . Sexual activity: Not Asked   Other Topics Concern  . None   Social History Narrative  . None       Family History:  The patient's family history includes Cancer in her brother and mother.   ROS:   Please see the history of present illness.    Back, left knee, and right ankle discomfort. New diagnosis of arthritis.  All other systems reviewed and are negative.   PHYSICAL EXAM:   VS:  BP (!) 156/94 (BP Location: Left Arm)   Pulse 88   Ht 5\' 2"  (1.575 m)   Wt 191 lb (86.6 kg)   BMI 34.93 kg/m    GEN: Well nourished, well developed, in no acute distress  HEENT: normal  Neck: no JVD, carotid bruits, or masses Cardiac: RRR; no murmurs, rubs, or gallops,no edema  Respiratory:  clear to auscultation bilaterally, normal work of breathing GI: soft, nontender, nondistended, + BS MS: no deformity or atrophy  Skin: warm and dry, no rash Neuro:  Alert and Oriented x 3, Strength and sensation are intact Psych: euthymic mood, full affect  Wt Readings from Last 3 Encounters:  11/29/16 191 lb (86.6 kg)  12/12/15 187 lb 9.6 oz (85.1 kg)  05/19/15 180 lb 1.9 oz (81.7 kg)      Studies/Labs Reviewed:   EKG:  EKG  Normal sinus rhythm, Paul wave progression, left axis deviation, nonspecific T-wave abnormality. No change compared to prior tracings.  Recent Labs: 12/12/2015: ALT 23   Lipid Panel    Component Value Date/Time   CHOL 163 12/12/2015 0828   TRIG 146 12/12/2015 0828   HDL 37 (L) 12/12/2015 0828   CHOLHDL 4.4 12/12/2015 0828   VLDL 29 12/12/2015 0828   LDLCALC 97 12/12/2015 0828   LDLDIRECT 149.1 05/28/2013 0946    Additional studies/ records that were reviewed today include:  Reviewed report of MRI performed in April 2018 and [facility] imaging. The study demonstrated spondylolisthesis at L5-L6 which has associated advanced bilateral facet degenerative change and joint effusion. No evidence of nerve root compression.    ASSESSMENT:    1. CAD of autologous bypass graft   2. Essential hypertension   3. Old MI (myocardial infarction)   4. Pure hypercholesterolemia    5. S/P CABG x 2   6. PAD (peripheral artery disease) (HCC)      PLAN:  In order of problems listed above:  1. No angina. Stable without ischemic complaints. Unfortunately, she is not able to exercise/walk very much now because of her back and left knee. Plan physical activity as tolerated. 2. Very well controlled under the circumstances of pain and nonsteroidal anti-inflammatory therapy. Our target is 140/90 mmHg or less. 3. No heart failure symptoms. 4. LDL target less than 70. 5. Current symptoms are related to lumbar disc disease.    Medication Adjustments/Labs and Tests Ordered: Current medicines are reviewed at length with the patient today.  Concerns regarding medicines are outlined above.  Medication changes, Labs and Tests ordered today are listed in the Patient Instructions below. Patient Instructions  Medication Instructions:  None  Labwork: None  Testing/Procedures: None  Follow-Up: Your physician wants you to follow-up in: 1 year with Dr. [name].  You will receive a reminder letter in the mail two months in advance. If you don't receive a letter, please call our office to schedule the follow-up appointment.   Any Other Special Instructions Will Be Listed Below (If Applicable).     If you need a refill on your cardiac medications before your next appointment, please call your pharmacy.      Signed, [name], MD  11/29/2016 9:09 AM    [facility] [address], [place], [place]  [address] Phone: [phone]; Fax: [phone] knee,

## 2016-11-29 ENCOUNTER — Encounter: Payer: Self-pay | Admitting: Interventional Cardiology

## 2016-11-29 ENCOUNTER — Ambulatory Visit (INDEPENDENT_AMBULATORY_CARE_PROVIDER_SITE_OTHER): Payer: PRIVATE HEALTH INSURANCE | Admitting: Interventional Cardiology

## 2016-11-29 ENCOUNTER — Encounter (INDEPENDENT_AMBULATORY_CARE_PROVIDER_SITE_OTHER): Payer: Self-pay

## 2016-11-29 VITALS — BP 156/94 | HR 88 | Ht 62.0 in | Wt 191.0 lb

## 2016-11-29 DIAGNOSIS — I25810 Atherosclerosis of coronary artery bypass graft(s) without angina pectoris: Secondary | ICD-10-CM | POA: Diagnosis not present

## 2016-11-29 DIAGNOSIS — I739 Peripheral vascular disease, unspecified: Secondary | ICD-10-CM | POA: Diagnosis not present

## 2016-11-29 DIAGNOSIS — I252 Old myocardial infarction: Secondary | ICD-10-CM | POA: Diagnosis not present

## 2016-11-29 DIAGNOSIS — I10 Essential (primary) hypertension: Secondary | ICD-10-CM

## 2016-11-29 DIAGNOSIS — E7800 Pure hypercholesterolemia, unspecified: Secondary | ICD-10-CM

## 2016-11-29 NOTE — Patient Instructions (Signed)
Medication Instructions:  None  Labwork: None  Testing/Procedures: None  Follow-Up: Your physician wants you to follow-up in: 1 year with Dr. Fait.  You will receive a reminder letter in the mail two months in advance. If you don't receive a letter, please call our office to schedule the follow-up appointment.   Any Other Special Instructions Will Be Listed Below (If Applicable).     If you need a refill on your cardiac medications before your next appointment, please call your pharmacy.   

## 2016-12-03 ENCOUNTER — Telehealth: Payer: Self-pay | Admitting: Interventional Cardiology

## 2016-12-03 NOTE — Telephone Encounter (Signed)
Left message for pt to call.

## 2016-12-03 NOTE — Telephone Encounter (Signed)
Called patient at work, but there was no answer. Left message for patient to call back.

## 2016-12-03 NOTE — Telephone Encounter (Signed)
Pt returned this office call and would like a call back at work please.

## 2016-12-03 NOTE — Telephone Encounter (Signed)
New message    Pt is calling to see if you got her xray report. She is asking for call to give a message to Dr. [name]. She would not say what the message is.

## 2016-12-06 NOTE — Telephone Encounter (Signed)
Spoke with pt and advised her that I have not received any records from other offices.  Pt states she was seen by ortho on Friday and they did an xray and gave her a cortisone shot.  She will have an MRI next week.  She wants Dr. [name] to be aware of everything so that he is in the loop.  Pt is going to contact their office and have them send over office note and imaging information.

## 2016-12-16 ENCOUNTER — Telehealth: Payer: Self-pay | Admitting: Interventional Cardiology

## 2016-12-16 NOTE — Telephone Encounter (Signed)
Pt states she got her MRI of her knee and when she Dr. Stephanie Acre, she told her that she had a severe meniscus tear and arthritis in her knee.  Pt states they said this would like not be able to be done laparoscopically.  Pt wanted to know if she needed appt with Dr. [name].  Advised pt she was recently seen and she would not need to see him at this time.  Advised once process is started for knee surgery to have that office send [place] a clearance form is they require it.  Pt verbalized understanding and was in agreement with this plan.

## 2016-12-16 NOTE — Telephone Encounter (Signed)
New message    Pt is calling asking for RN to call. She did not say what it was about.

## 2016-12-27 ENCOUNTER — Telehealth: Payer: Self-pay

## 2016-12-27 ENCOUNTER — Telehealth: Payer: Self-pay | Admitting: Interventional Cardiology

## 2016-12-27 NOTE — Telephone Encounter (Signed)
Walk In Pt Form-pt has questions about medications. Please call. Placed in [place] box.

## 2016-12-27 NOTE — Telephone Encounter (Signed)
Cardiac clearance placed in MR nurse fax box to be faxed to [place]

## 2016-12-28 ENCOUNTER — Telehealth: Payer: Self-pay | Admitting: Interventional Cardiology

## 2016-12-28 NOTE — Telephone Encounter (Signed)
PT AWARE  CAN HOLD ASA  FOR  72  HOURS  PRIOR TO PROCEDURE .Adonis Housekeeper

## 2016-12-28 NOTE — Telephone Encounter (Signed)
Patient calling, states that she dropped off her surgical clearance form on Friday and would like to know when she should stop taking her aspirin. Thanks.

## 2016-12-28 NOTE — Telephone Encounter (Signed)
New message     Pt is returning Christine's call please call her at work, thank you

## 2016-12-28 NOTE — Telephone Encounter (Signed)
LEFT VOICE MAIL THAT PT CAN HOLD ASA FOR  72  HOURS  PRIOR TO PROCEDURE  PER   CLEARANCE  FORM./CY

## 2016-12-29 ENCOUNTER — Telehealth: Payer: Self-pay | Admitting: Interventional Cardiology

## 2016-12-29 NOTE — Telephone Encounter (Signed)
Request for surgical clearance:  1. What type of surgery is being performed?knee surgery  2. When is this surgery scheduled? May 24 th, 2018   3. Are there any medications that need to be held prior to surgery and how long?  4. Name of physician performing surgery? Dr. Alvan Dame [facility]  5. What is your office phone and fax number? Phone [phone]   Patient calling, states that Dr. Aurea Graff office instructed her to take amlodipine but not to take Losartan medication. Patient would like to verify if this is okay, or if she could take both medications on the same day of her surgery. Patient is requesting a call as well, so that she will have instructions. Thanks.

## 2016-12-29 NOTE — Telephone Encounter (Signed)
Spoke with pt and advised ok to hold Losartan morning of surgery.  Pt verbalized understanding and was appreciative for call.

## 2017-01-13 ENCOUNTER — Telehealth: Payer: Self-pay | Admitting: Interventional Cardiology

## 2017-01-13 NOTE — Telephone Encounter (Signed)
Carolyn Fleming is calling because she had knee surgery on last Thursday and the doctor put her on Meloxicam 15mg   Twice for 7 days , and Aspirin 325mg  MG to take one for 4wks . She is wanting to know should she take this medication . Please call   Thanks

## 2017-01-13 NOTE — Telephone Encounter (Signed)
Will route to Dr. Sakai for review and advisement.  

## 2017-01-14 NOTE — Telephone Encounter (Signed)
Spoke with pt and made her aware of info provided by Dr. [name].  Pt states ASA 325mg  QD is for only 1 month.  Advised pt to go back to 81mg  QD after that.  Pt is actually going to surgeon's office today and is going to see if taking 325mg  QD is necessary.  Pt appreciative for call back.

## 2017-01-14 NOTE — Telephone Encounter (Signed)
This is okay. I am confused about aspirin. Is the recommendation 325 mg daily for 1 month?That would also be okay but can decrease back to 81 mg after.

## 2017-01-16 ENCOUNTER — Other Ambulatory Visit: Payer: Self-pay | Admitting: Interventional Cardiology

## 2017-02-07 ENCOUNTER — Telehealth: Payer: Self-pay | Admitting: Interventional Cardiology

## 2017-02-07 NOTE — Telephone Encounter (Signed)
Spoke with pt and she wanted to know if it was ok to take Meloxicam prescribed by her ortho who did her knee surgery.  Advised ok to do short term but not something we want her on for a long time.  Pt verbalized understanding and was appreciative for call.

## 2017-02-07 NOTE — Telephone Encounter (Signed)
New message     Pt wants to know what she can take for pain?  Having pain and inflammation in her knee , that will not effect her heart.   She took Melocam and it helped over the weekend.

## 2017-04-21 ENCOUNTER — Telehealth: Payer: Self-pay | Admitting: Interventional Cardiology

## 2017-04-21 NOTE — Telephone Encounter (Signed)
Spoke with pt and made her aware of recommendations per Dr. [name].  She would like for me to mail the prescription for the stockings to her once it is ready.  Advised I would get this taken care of once Dr. [name] is back in the office.  Pt appreciative for call.

## 2017-04-21 NOTE — Telephone Encounter (Signed)
New Message     Pt c/o swelling: STAT is pt has developed SOB within 24 hours  1. How long have you been experiencing swelling? 2 weeks  2. Where is the swelling located? Ankles    3.  Are you currently taking a "fluid pill"?  Yes -lasix   4.  Are you currently SOB?  No   5.  Have you traveled recently? No   Does she need to get compression hose to wear ? No symptoms but when she walks her ankles are swelling

## 2017-04-21 NOTE — Telephone Encounter (Signed)
Pt states for the last 2 weeks, when she would go for walks, she would notice some pain in her ankles.  This past Friday she happened to look down at her ankles when this occurred and she states it looked like she "had 2 ankles" d/t swelling.  Pt contacted Dr. Stephanie Acre and was seen on Tuesday.  Dr. Stephanie Acre gave Lasix (unsure of dose) QD until swelling resolved.  Swelling is much improved now.  Improves with elevation.  Denies increased salt in diet that she is aware of.  Denies SOB and CP. Pt wants to know if Dr. [name] feels like she should wear compression stockings and if so provide a prescription?  Pt currently on Hyzaar as well.  Advised I would send message to Dr. [name] for review and advisement.

## 2017-04-21 NOTE — Telephone Encounter (Signed)
Moderate tension knee-high support stockings are indicated.

## 2017-04-25 ENCOUNTER — Telehealth: Payer: Self-pay | Admitting: Interventional Cardiology

## 2017-04-25 NOTE — Telephone Encounter (Signed)
Spoke with pt and made her aware that prescription was ready.  Pt asked that I place at the front desk so she can come by today and pick up.  Pt appreciative for call.

## 2017-04-25 NOTE — Telephone Encounter (Signed)
New message    Pt is calling to find out if prescription is ready to be picked up or if it was mailed for the stockings. She said she is able to get off early and wants to go get them today. Please call.

## 2017-05-25 ENCOUNTER — Other Ambulatory Visit: Payer: Self-pay | Admitting: Family Medicine

## 2017-05-25 DIAGNOSIS — Z1231 Encounter for screening mammogram for malignant neoplasm of breast: Secondary | ICD-10-CM

## 2017-07-06 ENCOUNTER — Ambulatory Visit
Admission: RE | Admit: 2017-07-06 | Discharge: 2017-07-06 | Disposition: A | Discharge status: Home / Self Care | Payer: No Typology Code available for payment source | Source: Ambulatory Visit | Attending: Family Medicine | Admitting: Family Medicine

## 2017-07-06 ENCOUNTER — Ambulatory Visit: Payer: PRIVATE HEALTH INSURANCE

## 2017-07-06 DIAGNOSIS — Z1231 Encounter for screening mammogram for malignant neoplasm of breast: Secondary | ICD-10-CM

## 2017-07-18 ENCOUNTER — Encounter: Payer: Self-pay | Admitting: Obstetrics & Gynecology

## 2017-07-21 ENCOUNTER — Ambulatory Visit (INDEPENDENT_AMBULATORY_CARE_PROVIDER_SITE_OTHER): Payer: PRIVATE HEALTH INSURANCE | Admitting: Obstetrics & Gynecology

## 2017-07-21 ENCOUNTER — Telehealth: Payer: Self-pay | Admitting: *Deleted

## 2017-07-21 ENCOUNTER — Encounter: Payer: Self-pay | Admitting: Obstetrics & Gynecology

## 2017-07-21 VITALS — BP 140/78 | Ht 61.5 in | Wt 191.0 lb

## 2017-07-21 DIAGNOSIS — Z90710 Acquired absence of both cervix and uterus: Secondary | ICD-10-CM | POA: Diagnosis not present

## 2017-07-21 DIAGNOSIS — C50411 Malignant neoplasm of upper-outer quadrant of right female breast: Secondary | ICD-10-CM

## 2017-07-21 DIAGNOSIS — N6310 Unspecified lump in the right breast, unspecified quadrant: Secondary | ICD-10-CM

## 2017-07-21 DIAGNOSIS — Z780 Asymptomatic menopausal state: Secondary | ICD-10-CM | POA: Diagnosis not present

## 2017-07-21 DIAGNOSIS — Z01411 Encounter for gynecological examination (general) (routine) with abnormal findings: Secondary | ICD-10-CM

## 2017-07-21 DIAGNOSIS — E6609 Other obesity due to excess calories: Secondary | ICD-10-CM | POA: Diagnosis not present

## 2017-07-21 DIAGNOSIS — Z6835 Body mass index (BMI) 35.0-35.9, adult: Secondary | ICD-10-CM

## 2017-07-21 NOTE — Progress Notes (Signed)
Carolyn Fleming Nov 17, 1950 [id number]   History:    66 y.o. G1P1L1 Single  RP:  Established patient presenting for annual gyn exam   HPI: Menopause on no hormone replacement therapy.  Status post hysterectomy.  No pelvic pain.  Abstinent.  History of right breast cancer in 1998.  Mammogram negative July 06, 2017.  Urine and bowel movements normal.  Had an arthroscopy of her left knee in May 2018.  Recovering well and will increase physical activity from now on.  Body mass index 35.5.  Past medical history,surgical history, family history and social history were all reviewed and documented in the EPIC chart.  Gynecologic History No LMP recorded. Patient is postmenopausal. Contraception: status post hysterectomy Last Pap: 07/2016. Results were: normal, will obtain result Last mammogram: 07/06/2017. Results were: Negative [place] 2 yrs ago Bone density 3-4 yrs ago normal per patient.  Obstetric History OB History  Gravida Para Term Preterm AB Living  1 1       1   SAB TAB Ectopic Multiple Live Births               # Outcome Date GA Lbr Len/2nd Weight Sex Delivery Anes PTL Lv  1 Para                ROS: A ROS was performed and pertinent positives and negatives are included in the history.  GENERAL: No fevers or chills. HEENT: No change in vision, no earache, sore throat or sinus congestion. NECK: No pain or stiffness. CARDIOVASCULAR: No chest pain or pressure. No palpitations. PULMONARY: No shortness of breath, cough or wheeze. GASTROINTESTINAL: No abdominal pain, nausea, vomiting or diarrhea, melena or bright red blood per rectum. GENITOURINARY: No urinary frequency, urgency, hesitancy or dysuria. MUSCULOSKELETAL: No joint or muscle pain, no back pain, no recent trauma. DERMATOLOGIC: No rash, no itching, no lesions. ENDOCRINE: No polyuria, polydipsia, no heat or cold intolerance. No recent change in weight. HEMATOLOGICAL: No anemia or easy bruising or bleeding. NEUROLOGIC: No  headache, seizures, numbness, tingling or weakness. PSYCHIATRIC: No depression, no loss of interest in normal activity or change in sleep pattern.     Exam:   BP 140/78   Ht 5' 1.5" (1.562 m)   Wt 191 lb (86.6 kg)   BMI 35.50 kg/m   Body mass index is 35.5 kg/m.  General appearance : Well developed well nourished female. No acute distress HEENT: Eyes: no retinal hemorrhage or exudates,  Neck supple, trachea midline, no carotid bruits, no thyroidmegaly Lungs: Clear to auscultation, no rhonchi or wheezes, or rib retractions  Heart: Regular rate and rhythm, no murmurs or gallops Breast:Examined in sitting and supine position.  Left breast: no palpable masses or tenderness,  no skin retraction, no nipple inversion, no nipple discharge, no skin discoloration.  Right breast: History of right breast lumpectomy at 10:00.  Mass felt at 11:00 measuring 3 x 2 cm.  The mass is mobile nontender.  The skin is indented at that site but could be from the scar of the previous surgery.  Normal nipple with no discharge.  No axillary or supraclavicular lymphadenopathy bilaterally. Abdomen: no palpable masses or tenderness, no rebound or guarding Extremities: no edema or skin discoloration or tenderness  Pelvic: Vulva normal  Bartholin, Urethra, Skene Glands: Within normal limits             Vagina: No gross lesions or discharge  Cervix/Uterus absent  Adnexa  Without masses or tenderness  Anus and perineum  normal     Assessment/Plan:  66 y.o. female for annual exam   1. Encounter for gynecological examination with abnormal finding Gynecologic exam status post total hysterectomy.  Pap test normal November 2017, will obtain.  Breast exam showing a right breast mass, will investigate.  Health labs with family physician.  Colonoscopy 2 years ago.  2. History of total hysterectomy  3. Menopause present Well without hormone replacement therapy.  Vitamin D supplements, calcium in nutrition and regular  weightbearing physical activity recommended.  Will organize bone density with her family physician.  4. Mass of breast, right History of right breast cancer in 1998.  Mass felt in the right breast close to the scar from the previous right lumpectomy.  The mass is 3 x 2 cm mobile and situated at 11:00.  Scheduling a right diagnostic mammogram and right breast ultrasound.  5. Malignant neoplasm of upper-outer quadrant of right female breast, unspecified estrogen receptor status ([place]) Right breast cancer in 1998.  Recent screening mammogram negative in November 2018.  But given today's findings on physical exam, will investigate.  6. Class 2 obesity due to excess calories without serious comorbidity with body mass index (BMI) of 35.0 to 35.9 in adult Recommend low calorie, low sugar diet and regular physical activity to include aerobic activities and weightlifting.  Counseling on above issues more than 50% for 10 minutes.  Princess Bruins MD, 9:42 AM 07/21/2017

## 2017-07-21 NOTE — Telephone Encounter (Signed)
Patient scheduled on 07/25/17 @ 8:40am at breast center. Left message for pt to call.

## 2017-07-21 NOTE — Telephone Encounter (Signed)
-----   Message from Princess Bruins, MD sent at 07/21/2017 10:05 AM EST ----- Regarding: Right diagnostic Mammogram with breast US Right breast mass at 11 O'clock 3 x 2 cm, mobile.  H/O Rt Breast Ca 1998 with scar at 9-10 O'clock.  R/O new mass/recurrence of Breast Ca.

## 2017-07-21 NOTE — Patient Instructions (Signed)
1. Encounter for gynecological examination with abnormal finding Gynecologic exam status post total hysterectomy.  Pap test normal November 2017, will obtain.  Breast exam showing a right breast mass, will investigate.  Health labs with family physician.  Colonoscopy 2 years ago.  2. History of total hysterectomy  3. Menopause present Well without hormone replacement therapy.  Vitamin D supplements, calcium in nutrition and regular weightbearing physical activity recommended.  Will organize bone density with her family physician.  4. Mass of breast, right History of right breast cancer in 1998.  Mass felt in the right breast close to the scar from the previous right lumpectomy.  The mass is 3 x 2 cm mobile and situated at 11:00.  Scheduling a right diagnostic mammogram and right breast ultrasound.  5. Malignant neoplasm of upper-outer quadrant of right female breast, unspecified estrogen receptor status ([place]) Right breast cancer in 1998.  Recent screening mammogram negative in November 2018.  But given today's findings on physical exam, will investigate.  6. Class 2 obesity due to excess calories without serious comorbidity with body mass index (BMI) of 35.0 to 35.9 in adult Recommend low calorie, low sugar diet and regular physical activity to include aerobic activities and weightlifting.  Carolyn Fleming, it was a pleasure seeing you today!  I will inform you of your results as soon as available.   Calorie Counting for Weight Loss Calories are units of energy. Your body needs a certain amount of calories from food to keep you going throughout the day. When you eat more calories than your body needs, your body stores the extra calories as fat. When you eat fewer calories than your body needs, your body burns fat to get the energy it needs. Calorie counting means keeping track of how many calories you eat and drink each day. Calorie counting can be helpful if you need to lose weight. If you make sure  to eat fewer calories than your body needs, you should lose weight. Ask your health care provider what a healthy weight is for you. For calorie counting to work, you will need to eat the right number of calories in a day in order to lose a healthy amount of weight per week. A dietitian can help you determine how many calories you need in a day and will give you suggestions on how to reach your calorie goal.  A healthy amount of weight to lose per week is usually 1-2 lb (0.5-0.9 kg). This usually means that your daily calorie intake should be reduced by 500-750 calories.  Eating 1,200 - 1,500 calories per day can help most women lose weight.  Eating 1,500 - 1,800 calories per day can help most men lose weight.  What is my plan? My goal is to have __________ calories per day. If I have this many calories per day, I should lose around __________ pounds per week. What do I need to know about calorie counting? In order to meet your daily calorie goal, you will need to:  Find out how many calories are in each food you would like to eat. Try to do this before you eat.  Decide how much of the food you plan to eat.  Write down what you ate and how many calories it had. Doing this is called keeping a food log.  To successfully lose weight, it is important to balance calorie counting with a healthy lifestyle that includes regular activity. Aim for 150 minutes of moderate exercise (such as walking) or 75  minutes of vigorous exercise (such as running) each week. Where do I find calorie information?  The number of calories in a food can be found on a Nutrition Facts label. If a food does not have a Nutrition Facts label, try to look up the calories online or ask your dietitian for help. Remember that calories are listed per serving. If you choose to have more than one serving of a food, you will have to multiply the calories per serving by the amount of servings you plan to eat. For example, the label on  a package of bread might say that a serving size is 1 slice and that there are 90 calories in a serving. If you eat 1 slice, you will have eaten 90 calories. If you eat 2 slices, you will have eaten 180 calories. How do I keep a food log? Immediately after each meal, record the following information in your food log:  What you ate. Don't forget to include toppings, sauces, and other extras on the food.  How much you ate. This can be measured in cups, ounces, or number of items.  How many calories each food and drink had.  The total number of calories in the meal.  Keep your food log near you, such as in a small notebook in your pocket, or use a mobile app or website. Some programs will calculate calories for you and show you how many calories you have left for the day to meet your goal. What are some calorie counting tips?  Use your calories on foods and drinks that will fill you up and not leave you hungry: ? Some examples of foods that fill you up are nuts and nut butters, vegetables, lean proteins, and high-fiber foods like whole grains. High-fiber foods are foods with more than 5 g fiber per serving. ? Drinks such as sodas, specialty coffee drinks, alcohol, and juices have a lot of calories, yet do not fill you up.  Eat nutritious foods and avoid empty calories. Empty calories are calories you get from foods or beverages that do not have many vitamins or protein, such as candy, sweets, and soda. It is better to have a nutritious high-calorie food (such as an avocado) than a food with few nutrients (such as a bag of chips).  Know how many calories are in the foods you eat most often. This will help you calculate calorie counts faster.  Pay attention to calories in drinks. Low-calorie drinks include water and unsweetened drinks.  Pay attention to nutrition labels for "low fat" or "fat free" foods. These foods sometimes have the same amount of calories or more calories than the full fat  versions. They also often have added sugar, starch, or salt, to make up for flavor that was removed with the fat.  Find a way of tracking calories that works for you. Get creative. Try different apps or programs if writing down calories does not work for you. What are some portion control tips?  Know how many calories are in a serving. This will help you know how many servings of a certain food you can have.  Use a measuring cup to measure serving sizes. You could also try weighing out portions on a kitchen scale. With time, you will be able to estimate serving sizes for some foods.  Take some time to put servings of different foods on your favorite plates, bowls, and cups so you know what a serving looks like.  Try not to eat  straight from a bag or box. Doing this can lead to overeating. Put the amount you would like to eat in a cup or on a plate to make sure you are eating the right portion.  Use smaller plates, glasses, and bowls to prevent overeating.  Try not to multitask (for example, watch TV or use your computer) while eating. If it is time to eat, sit down at a table and enjoy your food. This will help you to know when you are full. It will also help you to be aware of what you are eating and how much you are eating. What are tips for following this plan? Reading food labels  Check the calorie count compared to the serving size. The serving size may be smaller than what you are used to eating.  Check the source of the calories. Make sure the food you are eating is high in vitamins and protein and low in saturated and trans fats. Shopping  Read nutrition labels while you shop. This will help you make healthy decisions before you decide to purchase your food.  Make a grocery list and stick to it. Cooking  Try to cook your favorite foods in a healthier way. For example, try baking instead of frying.  Use low-fat dairy products. Meal planning  Use more fruits and vegetables.  Half of your plate should be fruits and vegetables.  Include lean proteins like poultry and fish. How do I count calories when eating out?  Ask for smaller portion sizes.  Consider sharing an entree and sides instead of getting your own entree.  If you get your own entree, eat only half. Ask for a box at the beginning of your meal and put the rest of your entree in it so you are not tempted to eat it.  If calories are listed on the menu, choose the lower calorie options.  Choose dishes that include vegetables, fruits, whole grains, low-fat dairy products, and lean protein.  Choose items that are boiled, broiled, grilled, or steamed. Stay away from items that are buttered, battered, fried, or served with cream sauce. Items labeled "crispy" are usually fried, unless stated otherwise.  Choose water, low-fat milk, unsweetened iced tea, or other drinks without added sugar. If you want an alcoholic beverage, choose a lower calorie option such as a glass of wine or light beer.  Ask for dressings, sauces, and syrups on the side. These are usually high in calories, so you should limit the amount you eat.  If you want a salad, choose a garden salad and ask for grilled meats. Avoid extra toppings like bacon, cheese, or fried items. Ask for the dressing on the side, or ask for olive oil and vinegar or lemon to use as dressing.  Estimate how many servings of a food you are given. For example, a serving of cooked rice is  cup or about the size of half a baseball. Knowing serving sizes will help you be aware of how much food you are eating at restaurants. The list below tells you how big or small some common portion sizes are based on everyday objects: ? 1 oz-4 stacked dice. ? 3 oz-1 deck of cards. ? 1 tsp-1 die. ? 1 Tbsp- a ping-pong ball. ? 2 Tbsp-1 ping-pong ball. ?  cup- baseball. ? 1 cup-1 baseball. Summary  Calorie counting means keeping track of how many calories you eat and drink each  day. If you eat fewer calories than your body needs, you should  lose weight.  A healthy amount of weight to lose per week is usually 1-2 lb (0.5-0.9 kg). This usually means reducing your daily calorie intake by 500-750 calories.  The number of calories in a food can be found on a Nutrition Facts label. If a food does not have a Nutrition Facts label, try to look up the calories online or ask your dietitian for help.  Use your calories on foods and drinks that will fill you up, and not on foods and drinks that will leave you hungry.  Use smaller plates, glasses, and bowls to prevent overeating. This information is not intended to replace advice given to you by your health care provider. Make sure you discuss any questions you have with your health care provider. Document Released: 08/02/2005 Document Revised: 07/02/2016 Document Reviewed: 07/02/2016 Elsevier Interactive Patient Education  2017 [place] Maintenance for Postmenopausal Women Menopause is a normal process in which your reproductive ability comes to an end. This process happens gradually over a span of months to years, usually between the ages of 17 and 5. Menopause is complete when you have missed 12 consecutive menstrual periods. It is important to talk with your health care provider about some of the most common conditions that affect postmenopausal women, such as heart disease, cancer, and bone loss (osteoporosis). Adopting a healthy lifestyle and getting preventive care can help to promote your health and wellness. Those actions can also lower your chances of developing some of these common conditions. What should I know about menopause? During menopause, you may experience a number of symptoms, such as:  Moderate-to-severe hot flashes.  Night sweats.  Decrease in sex drive.  Mood swings.  Headaches.  Tiredness.  Irritability.  Memory problems.  Insomnia.  Choosing to treat or not to treat  menopausal changes is an individual decision that you make with your health care provider. What should I know about hormone replacement therapy and supplements? Hormone therapy products are effective for treating symptoms that are associated with menopause, such as hot flashes and night sweats. Hormone replacement carries certain risks, especially as you become older. If you are thinking about using estrogen or estrogen with progestin treatments, discuss the benefits and risks with your health care provider. What should I know about heart disease and stroke? Heart disease, heart attack, and stroke become more likely as you age. This may be due, in part, to the hormonal changes that your body experiences during menopause. These can affect how your body processes dietary fats, triglycerides, and cholesterol. Heart attack and stroke are both medical emergencies. There are many things that you can do to help prevent heart disease and stroke:  Have your blood pressure checked at least every 1-2 years. High blood pressure causes heart disease and increases the risk of stroke.  If you are 99-50 years old, ask your health care provider if you should take aspirin to prevent a heart attack or a stroke.  Do not use any tobacco products, including cigarettes, chewing tobacco, or electronic cigarettes. If you need help quitting, ask your health care provider.  It is important to eat a healthy diet and maintain a healthy weight. ? Be sure to include plenty of vegetables, fruits, low-fat dairy products, and lean protein. ? Avoid eating foods that are high in solid fats, added sugars, or salt (sodium).  Get regular exercise. This is one of the most important things that you can do for your health. ? Try to exercise for at least  150 minutes each week. The type of exercise that you do should increase your heart rate and make you sweat. This is known as moderate-intensity exercise. ? Try to do strengthening  exercises at least twice each week. Do these in addition to the moderate-intensity exercise.  Know your numbers.Ask your health care provider to check your cholesterol and your blood glucose. Continue to have your blood tested as directed by your health care provider.  What should I know about cancer screening? There are several types of cancer. Take the following steps to reduce your risk and to catch any cancer development as early as possible. Breast Cancer  Practice breast self-awareness. ? This means understanding how your breasts normally appear and feel. ? It also means doing regular breast self-exams. Let your health care provider know about any changes, no matter how small.  If you are 78 or older, have a clinician do a breast exam (clinical breast exam or CBE) every year. Depending on your age, family history, and medical history, it may be recommended that you also have a yearly breast X-ray (mammogram).  If you have a family history of breast cancer, talk with your health care provider about genetic screening.  If you are at high risk for breast cancer, talk with your health care provider about having an MRI and a mammogram every year.  Breast cancer (BRCA) gene test is recommended for women who have family members with BRCA-related cancers. Results of the assessment will determine the need for genetic counseling and BRCA1 and for BRCA2 testing. BRCA-related cancers include these types: ? Breast. This occurs in males or females. ? Ovarian. ? Tubal. This may also be called fallopian tube cancer. ? Cancer of the abdominal or pelvic lining (peritoneal cancer). ? Prostate. ? Pancreatic.  Cervical, Uterine, and Ovarian Cancer Your health care provider may recommend that you be screened regularly for cancer of the pelvic organs. These include your ovaries, uterus, and vagina. This screening involves a pelvic exam, which includes checking for microscopic changes to the surface of  your cervix (Pap test).  For women ages 21-65, health care providers may recommend a pelvic exam and a Pap test every three years. For women ages 56-65, they may recommend the Pap test and pelvic exam, combined with testing for human papilloma virus (HPV), every five years. Some types of HPV increase your risk of cervical cancer. Testing for HPV may also be done on women of any age who have unclear Pap test results.  Other health care providers may not recommend any screening for nonpregnant women who are considered low risk for pelvic cancer and have no symptoms. Ask your health care provider if a screening pelvic exam is right for you.  If you have had past treatment for cervical cancer or a condition that could lead to cancer, you need Pap tests and screening for cancer for at least 20 years after your treatment. If Pap tests have been discontinued for you, your risk factors (such as having a new sexual partner) need to be reassessed to determine if you should start having screenings again. Some women have medical problems that increase the chance of getting cervical cancer. In these cases, your health care provider may recommend that you have screening and Pap tests more often.  If you have a family history of uterine cancer or ovarian cancer, talk with your health care provider about genetic screening.  If you have vaginal bleeding after reaching menopause, tell your health care provider.  There are currently no reliable tests available to screen for ovarian cancer.  Lung Cancer Lung cancer screening is recommended for adults 47-33 years old who are at high risk for lung cancer because of a history of smoking. A yearly low-dose CT scan of the lungs is recommended if you:  Currently smoke.  Have a history of at least 30 pack-years of smoking and you currently smoke or have quit within the past 15 years. A pack-year is smoking an average of one pack of cigarettes per day for one year.  Yearly  screening should:  Continue until it has been 15 years since you quit.  Stop if you develop a health problem that would prevent you from having lung cancer treatment.  Colorectal Cancer  This type of cancer can be detected and can often be prevented.  Routine colorectal cancer screening usually begins at age 63 and continues through age 48.  If you have risk factors for colon cancer, your health care provider may recommend that you be screened at an earlier age.  If you have a family history of colorectal cancer, talk with your health care provider about genetic screening.  Your health care provider may also recommend using home test kits to check for hidden blood in your stool.  A small camera at the end of a tube can be used to examine your colon directly (sigmoidoscopy or colonoscopy). This is done to check for the earliest forms of colorectal cancer.  Direct examination of the colon should be repeated every 5-10 years until age 17. However, if early forms of precancerous polyps or small growths are found or if you have a family history or genetic risk for colorectal cancer, you may need to be screened more often.  Skin Cancer  Check your skin from head to toe regularly.  Monitor any moles. Be sure to tell your health care provider: ? About any new moles or changes in moles, especially if there is a change in a mole's shape or color. ? If you have a mole that is larger than the size of a pencil eraser.  If any of your family members has a history of skin cancer, especially at a young age, talk with your health care provider about genetic screening.  Always use sunscreen. Apply sunscreen liberally and repeatedly throughout the day.  Whenever you are outside, protect yourself by wearing long sleeves, pants, a wide-brimmed hat, and sunglasses.  What should I know about osteoporosis? Osteoporosis is a condition in which bone destruction happens more quickly than new bone creation.  After menopause, you may be at an increased risk for osteoporosis. To help prevent osteoporosis or the bone fractures that can happen because of osteoporosis, the following is recommended:  If you are 23-33 years old, get at least 1,000 mg of calcium and at least 600 mg of vitamin D per day.  If you are older than age 29 but younger than age 63, get at least 1,200 mg of calcium and at least 600 mg of vitamin D per day.  If you are older than age 34, get at least 1,200 mg of calcium and at least 800 mg of vitamin D per day.  Smoking and excessive alcohol intake increase the risk of osteoporosis. Eat foods that are rich in calcium and vitamin D, and do weight-bearing exercises several times each week as directed by your health care provider. What should I know about how menopause affects my mental health? Depression may occur at any  age, but it is more common as you become older. Common symptoms of depression include:  Low or sad mood.  Changes in sleep patterns.  Changes in appetite or eating patterns.  Feeling an overall lack of motivation or enjoyment of activities that you previously enjoyed.  Frequent crying spells.  Talk with your health care provider if you think that you are experiencing depression. What should I know about immunizations? It is important that you get and maintain your immunizations. These include:  Tetanus, diphtheria, and pertussis (Tdap) booster vaccine.  Influenza every year before the flu season begins.  Pneumonia vaccine.  Shingles vaccine.  Your health care provider may also recommend other immunizations. This information is not intended to replace advice given to you by your health care provider. Make sure you discuss any questions you have with your health care provider. Document Released: 09/24/2005 Document Revised: 02/20/2016 Document Reviewed: 05/06/2015 Elsevier Interactive Patient Education  2018 [employer].

## 2017-07-22 ENCOUNTER — Telehealth: Payer: Self-pay | Admitting: Interventional Cardiology

## 2017-07-22 NOTE — Telephone Encounter (Signed)
Pt aware of time and date

## 2017-07-22 NOTE — Telephone Encounter (Signed)
New message     Pt c/o medication issue:  1. Name of Medication:  meloxicam (MOBIC) 15 MG tablet Take 15 mg by mouth daily.     2. How are you currently taking this medication (dosage and times per day)?  1/2 pill if pain is not bad, if pain is really bad she takes a whole pill   3. Are you having a reaction (difficulty breathing--STAT)?  no  4. What is your medication issue?  Will this medication effect her heart medicine or cause a reaction ?

## 2017-07-22 NOTE — Telephone Encounter (Signed)
Dr. [name] said ok to take for 1-2 weeks.  Spoke with pt and advised her of this.  Pt will contact ortho to see about alternative medication for pain.  Pt appreciative for call.

## 2017-07-25 ENCOUNTER — Other Ambulatory Visit: Payer: No Typology Code available for payment source

## 2017-07-26 ENCOUNTER — Other Ambulatory Visit: Payer: No Typology Code available for payment source

## 2017-07-27 ENCOUNTER — Ambulatory Visit
Admission: RE | Admit: 2017-07-27 | Discharge: 2017-07-27 | Disposition: A | Discharge status: Home / Self Care | Payer: No Typology Code available for payment source | Source: Ambulatory Visit | Attending: Obstetrics & Gynecology | Admitting: Obstetrics & Gynecology

## 2017-07-27 DIAGNOSIS — N6310 Unspecified lump in the right breast, unspecified quadrant: Secondary | ICD-10-CM

## 2017-10-12 ENCOUNTER — Telehealth: Payer: Self-pay | Admitting: Interventional Cardiology

## 2017-10-12 NOTE — Telephone Encounter (Signed)
New message    Patient would like Dr [name] to refer her to a PCP she has a sinus infection

## 2017-10-12 NOTE — Telephone Encounter (Signed)
Pt having chronic sinus infections and would like to switch PCPs.  Provided pt with information of who we normally refer to.  Pt verbalized understanding and was appreciative for call.

## 2017-10-16 ENCOUNTER — Other Ambulatory Visit: Payer: Self-pay | Admitting: Interventional Cardiology

## 2017-11-09 ENCOUNTER — Telehealth: Payer: Self-pay | Admitting: Interventional Cardiology

## 2017-11-09 NOTE — Telephone Encounter (Signed)
Error

## 2017-11-14 ENCOUNTER — Other Ambulatory Visit: Payer: Self-pay | Admitting: Interventional Cardiology

## 2017-11-15 ENCOUNTER — Other Ambulatory Visit: Payer: Self-pay | Admitting: Interventional Cardiology

## 2017-11-15 MED ORDER — ATORVASTATIN CALCIUM 10 MG PO TABS: ORAL_TABLET | ORAL | 0 refills | Status: DC

## 2017-11-15 NOTE — Telephone Encounter (Signed)
Pt's medication was sent to pt's pharmacy as requested. Confirmation received.  °

## 2017-11-16 ENCOUNTER — Telehealth: Payer: Self-pay | Admitting: Interventional Cardiology

## 2017-11-16 ENCOUNTER — Other Ambulatory Visit: Payer: Self-pay | Admitting: *Deleted

## 2017-11-16 NOTE — Telephone Encounter (Signed)
New Message    Her pcp will no longer fill her medication needs 90 day supply called in    *STAT* If patient is at the pharmacy, call can be transferred to refill team.   1. Which medications need to be refilled? (please list name of each medication and dose if known)   atorvastatin (LIPITOR) 10 MG tablet Take 10mg  by mouth on Mondays, Wednesdays, and Fridays. Please keep upcoming appt for future refills. Thank you        2. Which pharmacy/location (including street and city if local pharmacy) is medication to be sent to? [facility] [address]   3. Do they need a 30 day or 90 day supply?  [employer]

## 2017-11-16 NOTE — Telephone Encounter (Signed)
Sent to pharmacy 11-15-17  90 day supply

## 2017-11-22 DIAGNOSIS — M25511 Pain in right shoulder: Secondary | ICD-10-CM | POA: Diagnosis not present

## 2017-11-29 DIAGNOSIS — M25511 Pain in right shoulder: Secondary | ICD-10-CM | POA: Diagnosis not present

## 2017-12-13 DIAGNOSIS — M7551 Bursitis of right shoulder: Secondary | ICD-10-CM | POA: Diagnosis not present

## 2018-01-10 ENCOUNTER — Other Ambulatory Visit: Payer: Self-pay | Admitting: Interventional Cardiology

## 2018-01-10 MED ORDER — LOSARTAN POTASSIUM-HCTZ 50-12.5 MG PO TABS: 1.0000 | ORAL_TABLET | Freq: Every day | ORAL | 0 refills | Status: DC

## 2018-01-10 NOTE — Telephone Encounter (Signed)
Outpatient Medication Detail    Disp Refills Start End   losartan-hydrochlorothiazide (HYZAAR) 50-12.5 MG tablet 90 tablet 0 01/10/2018    Sig - Route: Take 1 tablet by mouth daily. Please keep upcoming appt in July for future refills. Thank you - Oral   Sent to pharmacy as: losartan-hydrochlorothiazide (HYZAAR) 50-12.5 MG tablet   Notes to Pharmacy: Pt must keep upcoming appt in July before anymore refills. Thank you   E-Prescribing Status: Receipt confirmed by pharmacy (01/10/2018 9:10 AM EDT)   Pharmacy   [id number] [address], [place]

## 2018-01-10 NOTE — Telephone Encounter (Signed)
Pt's medication was sent to pt's pharmacy as requested. Confirmation received.  °

## 2018-01-18 DIAGNOSIS — R05 Cough: Secondary | ICD-10-CM | POA: Diagnosis not present

## 2018-02-08 ENCOUNTER — Other Ambulatory Visit: Payer: Self-pay | Admitting: *Deleted

## 2018-02-08 MED ORDER — AMLODIPINE BESYLATE 5 MG PO TABS: 5.0000 mg | ORAL_TABLET | Freq: Every day | ORAL | 0 refills | Status: DC

## 2018-02-12 ENCOUNTER — Other Ambulatory Visit: Payer: Self-pay | Admitting: Interventional Cardiology

## 2018-02-20 ENCOUNTER — Ambulatory Visit: Payer: No Typology Code available for payment source | Admitting: Interventional Cardiology

## 2018-02-21 NOTE — Progress Notes (Signed)
Cardiology Office Note    Date:  02/22/2018   ID:  DENETTA FEI, DOB 10-25-1950, MRN [id number]  PCP:  Jonathon Jordan, MD  Cardiologist: [name], MD   Chief Complaint  Patient presents with  . Coronary Artery Disease    History of Present Illness:  AARIA HAPP is a 67 y.o. female who presents for CAD, prior anterior non-ST elevation MI, coronary bypass grafting with LIMA to LAD, SVG to OM 2013, hypertension,abnormal right LE waveforms hyperlipidemia and BMI 35.   Interval development of shingles affecting her eye.  Also frozen shoulder.  She has not had cardiac complaints.  She denies chest pain.  No exertional dyspnea.  Not exercising regularly.  No recent laboratory data to assess glycemic control and A1c.  Not known to be diabetic.  Not smoking.  Compliant with medical therapy.   Past Medical History:  Diagnosis Date  . Arthritis   . Asthma   . Breast cancer ([place])    Right breast   . GERD (gastroesophageal reflux disease)   . Hyperlipidemia   . Hypertension 01/25/2012  . Obesity (BMI 35.0-39.9 without comorbidity) 01/25/2012  . S/P CABG x 2 01/24/2012   Emergency LIMA to LAD, SVG to OM1, EVH via right thigh  . Sleep apnea    uses cpap    Past Surgical History:  Procedure Laterality Date  . ABDOMINAL HYSTERECTOMY    . BREAST LUMPECTOMY  1997   breast cancer  . CARDIAC CATHETERIZATION N/A 05/07/2015   Procedure: Left Heart Cath and Cors/Grafts Angiography;  Surgeon: [name], MD;  Location: [facility];  Service: Cardiovascular;  Laterality: N/A;  . CORONARY ARTERY BYPASS GRAFT  01/24/2012   Procedure: CORONARY ARTERY BYPASS GRAFTING (CABG);  Surgeon: [name], MD;  Location: [facility];  Service: Open Heart Surgery;  Laterality: N/A;  Coronary Artery bypass Graft times two utilizing the left internal mammary artery and the right greater saphenous vein harvested endoscopically,  Transesophageal Echocardiogram  . LEFT HEART CATHETERIZATION  WITH CORONARY ANGIOGRAM N/A 01/24/2012   Procedure: LEFT HEART CATHETERIZATION WITH CORONARY ANGIOGRAM;  Surgeon: [name], MD;  Location: [facility];  Service: Cardiovascular;  Laterality: N/A;  . TONSILLECTOMY    . TUBAL LIGATION      Current Medications: Outpatient Medications Prior to Visit  Medication Sig Dispense Refill  . ALPHAGAN P 0.1 % SOLN     . amLODipine (NORVASC) 5 MG tablet amlodipine besylate 5 mg tabs    . aspirin (ASPIRIN 81) 81 MG chewable tablet Aspir-81    . atorvastatin (LIPITOR) 10 MG tablet TAKE 1 TABLET BY MOUTH ON MONDAYS, WEDNESDAYS, AND FRIDAYS. Please keep upcoming appt for future refills. Thank you 45 tablet 0  . azelastine (OPTIVAR) 0.05 % ophthalmic solution INSTILL 1 DROP INTO AFFECTED EYE TWICE A DAY  11  . Azelastine HCl 0.15 % SOLN azelastine 0.15 % (205.5 mcg) nasal spray  USE 1 SPRAY INTO EACH NOSTRIL TWICE A DAY    . brimonidine (ALPHAGAN P) 0.1 % SOLN Place 1 drop into the right eye 2 times daily.    . dorzolamide (TRUSOPT) 2 % ophthalmic solution Place 1 drop into both eyes 2 (two) times daily.     . DORZOLAMIDE HCL-TIMOLOL MAL OP Apply to eye.    . fluocinonide ointment (LIDEX) 0.05 % fluocinonide 0.05 % topical ointment  APPLY 1 APPLICATION TO ITCHY AREAS TWICE A DAY    . loratadine (CLARITIN) 10  MG tablet Take 10 mg by mouth daily.    Marland Kitchen loratadine (CLARITIN) 10 MG tablet loratadine 10 mg tabs    . losartan-hydrochlorothiazide (HYZAAR) 50-12.5 MG tablet Take 1 tablet by mouth daily. Please keep upcoming appt in July for future refills. Thank you 90 tablet 0  . meclizine (ANTIVERT) 12.5 MG tablet Take 1 tablet (12.5 mg total) by mouth 3 (three) times daily as needed for dizziness. 90 tablet 0  . meloxicam (MOBIC) 15 MG tablet Take 15 mg by mouth daily.    Marland Kitchen NITROSTAT 0.4 MG SL tablet Place 1 tablet under the tongue every 5 (five) minutes as needed for chest pain.   0  . ondansetron (ZOFRAN) 8 MG tablet TAKE 1 TABLET BY MOUTH TWICE A DAY AS  NEEDED FOR NAUSEA  1  . oxyCODONE-acetaminophen (PERCOCET/ROXICET) 5-325 MG tablet TAKE 1-2 TABS BY MOUTH EVERY 6 HOURS AS NEEDED SEVERE PAIN  0  . RESTORA RX 60-1.25 MG CAPS Take 1 capsule by mouth daily.  3  . RHOPRESSA 0.02 % SOLN     . timolol (BETIMOL) 0.25 % ophthalmic solution Place 1 drop into both eyes every morning.     . Travoprost, BAK Free, (TRAVATAN Z) 0.004 % SOLN ophthalmic solution Travatan Z 0.004 % eye drops  INSTILL 1 DROP INTO BOTH EYES EVERY DAY AT NIGHT    . triamcinolone ointment (KENALOG) 0.1 % APPLY TO AFFECTED AREA TWICE A DAY  5  . amLODipine (NORVASC) 5 MG tablet Take 1 tablet (5 mg total) by mouth daily. Please keep 7/10 appointment for additional refills thanks. (Patient not taking: Reported on 02/22/2018) 90 tablet 0  . aspirin EC 81 MG tablet Take 81 mg by mouth daily.     No facility-administered medications prior to visit.      Allergies:   Codeine; Latex; and Sulfonamide derivatives   Social History   Socioeconomic History  . Marital status: Divorced    Spouse name: Not on file  . Number of children: Not on file  . Years of education: Not on file  . Highest education level: Not on file  Occupational History  . Not on file  Social Needs  . Financial resource strain: Not on file  . Food insecurity:    Worry: Not on file    Inability: Not on file  . Transportation needs:    Medical: Not on file    Non-medical: Not on file  Tobacco Use  . Smoking status: Never Smoker  . Smokeless tobacco: Never Used  Substance and Sexual Activity  . Alcohol use: No  . Drug use: No  . Sexual activity: Never    Comment: 1st intercourse- 26,  partners- 2   Lifestyle  . Physical activity:    Days per week: Not on file    Minutes per session: Not on file  . Stress: Not on file  Relationships  . Social connections:    Talks on phone: Not on file    Gets together: Not on file    Attends religious service: Not on file    Active member of club or organization:  Not on file    Attends meetings of clubs or organizations: Not on file    Relationship status: Not on file  Other Topics Concern  . Not on file  Social History Narrative  . Not on file     Family History:  The patient's family history includes Cancer in her brother and mother; Diabetes in her brother, mother,  and sister; Hypertension in her brother and mother.   ROS:   Please see the history of present illness.    Frozen shoulder and glaucoma. All other systems reviewed and are negative.   PHYSICAL EXAM:   VS:  BP 128/88   Pulse 79   Ht 5' 1.5" (1.562 m)   Wt 184 lb (83.5 kg)   SpO2 97%   BMI 34.20 kg/m    GEN: Well nourished, well developed, in no acute distress  HEENT: normal  Neck: no JVD, carotid bruits, or masses Cardiac: RRR; no murmurs, rubs, or gallops,no edema  Respiratory:  clear to auscultation bilaterally, normal work of breathing GI: soft, nontender, nondistended, + BS MS: no deformity or atrophy  Skin: warm and dry, no rash Neuro:  Alert and Oriented x 3, Strength and sensation are intact Psych: euthymic mood, full affect  Wt Readings from Last 3 Encounters:  02/22/18 184 lb (83.5 kg)  07/21/17 191 lb (86.6 kg)  11/29/16 191 lb (86.6 kg)      Studies/Labs Reviewed:   EKG:  EKG normal sinus rhythm, poor R wave progression V1 through V5.  Compared to prior tracings.  Recent Labs: No results found for requested labs within last 8760 hours.   Lipid Panel    Component Value Date/Time   CHOL 163 12/12/2015 0828   TRIG 146 12/12/2015 0828   HDL 37 (L) 12/12/2015 0828   CHOLHDL 4.4 12/12/2015 0828   VLDL 29 12/12/2015 0828   LDLCALC 97 12/12/2015 0828   LDLDIRECT 149.1 05/28/2013 0946    Additional studies/ records that were reviewed today include:  No new studies.    ASSESSMENT:    1. CAD of autologous bypass graft   2. Essential hypertension   3. PAD (peripheral artery disease) ([place])   4. Pure hypercholesterolemia      PLAN:  In  order of problems listed above:  1. Without angina.  I encouraged aerobic activity for both coronary disease, risk factor modification, and PAD. 2. Stable without significant elevation.  Sodium less than 2 g/day.  Aerobic activity recommended. 3. Moderate aerobic walking/activity encouraged. 4. Last LDL greater than 18 months ago.  LDL at that time was 97.  She is intolerant of higher intensity statin therapy.  On atorvastatin 10 mg 3 days/week.  Aerobic activity, clinical follow-up in 1 year, notify us of chest discomfort.    Medication Adjustments/Labs and Tests Ordered: Current medicines are reviewed at length with the patient today.  Concerns regarding medicines are outlined above.  Medication changes, Labs and Tests ordered today are listed in the Patient Instructions below. Patient Instructions  Medication Instructions:  Your physician recommends that you continue on your current medications as directed. Please refer to the Current Medication list given to you today.  Labwork: Lipid, liver and BMET today  Testing/Procedures: None  Follow-Up: Your physician wants you to follow-up in: 1 year with Dr. [name].  You will receive a reminder letter in the mail two months in advance. If you don't receive a letter, please call our office to schedule the follow-up appointment.   Any Other Special Instructions Will Be Listed Below (If Applicable).     If you need a refill on your cardiac medications before your next appointment, please call your pharmacy.      Signed, [name], MD  02/22/2018 9:07 AM    [facility] [address] [place], [place], [place]  [address] Phone: [phone]; Fax: [phone]

## 2018-02-22 ENCOUNTER — Encounter: Payer: Self-pay | Admitting: Interventional Cardiology

## 2018-02-22 ENCOUNTER — Ambulatory Visit (INDEPENDENT_AMBULATORY_CARE_PROVIDER_SITE_OTHER): Payer: No Typology Code available for payment source | Admitting: Interventional Cardiology

## 2018-02-22 VITALS — BP 128/88 | HR 79 | Ht 61.5 in | Wt 184.0 lb

## 2018-02-22 DIAGNOSIS — I10 Essential (primary) hypertension: Secondary | ICD-10-CM

## 2018-02-22 DIAGNOSIS — I25810 Atherosclerosis of coronary artery bypass graft(s) without angina pectoris: Secondary | ICD-10-CM

## 2018-02-22 DIAGNOSIS — E7800 Pure hypercholesterolemia, unspecified: Secondary | ICD-10-CM

## 2018-02-22 DIAGNOSIS — I739 Peripheral vascular disease, unspecified: Secondary | ICD-10-CM

## 2018-02-22 LAB — BASIC METABOLIC PANEL
BUN / CREAT RATIO: 13 (ref 12–28)
BUN: 16 mg/dL (ref 8–27)
CHLORIDE: 101 mmol/L (ref 96–106)
CO2: 23 mmol/L (ref 20–29)
CREATININE: 1.28 mg/dL — AB (ref 0.57–1.00)
Calcium: 9.1 mg/dL (ref 8.7–10.3)
GFR calc non Af Amer: 44 mL/min/{1.73_m2} — ABNORMAL LOW (ref >59)
GFR, EST AFRICAN AMERICAN: 50 mL/min/{1.73_m2} — AB (ref >59)
GLUCOSE: 123 mg/dL — AB (ref 65–99)
Potassium: 4.1 mmol/L (ref 3.5–5.2)
SODIUM: 140 mmol/L (ref 134–144)

## 2018-02-22 LAB — LIPID PANEL
CHOLESTEROL TOTAL: 217 mg/dL — AB (ref 100–199)
Chol/HDL Ratio: 5 ratio — ABNORMAL HIGH (ref 0.0–4.4)
HDL: 43 mg/dL (ref >39)
LDL Calculated: 127 mg/dL — ABNORMAL HIGH (ref 0–99)
TRIGLYCERIDES: 237 mg/dL — AB (ref 0–149)
VLDL Cholesterol Cal: 47 mg/dL — ABNORMAL HIGH (ref 5–40)

## 2018-02-22 LAB — HEPATIC FUNCTION PANEL
ALK PHOS: 75 IU/L (ref 39–117)
ALT: 33 IU/L — AB (ref 0–32)
AST: 17 IU/L (ref 0–40)
Albumin: 4 g/dL (ref 3.6–4.8)
BILIRUBIN, DIRECT: 0.12 mg/dL (ref 0.00–0.40)
Bilirubin Total: 0.4 mg/dL (ref 0.0–1.2)
Total Protein: 6.6 g/dL (ref 6.0–8.5)

## 2018-02-22 MED ORDER — LOSARTAN POTASSIUM-HCTZ 50-12.5 MG PO TABS: 1.0000 | ORAL_TABLET | Freq: Every day | ORAL | 3 refills | Status: DC

## 2018-02-22 MED ORDER — ATORVASTATIN CALCIUM 10 MG PO TABS: ORAL_TABLET | ORAL | 3 refills | Status: DC

## 2018-02-22 MED ORDER — AMLODIPINE BESYLATE 5 MG PO TABS: 5.0000 mg | ORAL_TABLET | Freq: Every day | ORAL | 3 refills | Status: DC

## 2018-02-22 NOTE — Addendum Note (Signed)
Addended by: Gar Ponto on: 02/22/2018 03:37 PM   Modules accepted: Orders

## 2018-02-22 NOTE — Patient Instructions (Signed)
Medication Instructions:  Your physician recommends that you continue on your current medications as directed. Please refer to the Current Medication list given to you today.  Labwork: Lipid, liver and BMET today  Testing/Procedures: None  Follow-Up: Your physician wants you to follow-up in: 1 year with Dr. [name].  You will receive a reminder letter in the mail two months in advance. If you don't receive a letter, please call our office to schedule the follow-up appointment.   Any Other Special Instructions Will Be Listed Below (If Applicable).     If you need a refill on your cardiac medications before your next appointment, please call your pharmacy.

## 2018-02-24 NOTE — Addendum Note (Signed)
Addended by: Loren Racer on: 02/24/2018 09:33 AM   Modules accepted: Orders

## 2018-03-08 DIAGNOSIS — H401113 Primary open-angle glaucoma, right eye, severe stage: Secondary | ICD-10-CM | POA: Diagnosis not present

## 2018-03-08 DIAGNOSIS — H401122 Primary open-angle glaucoma, left eye, moderate stage: Secondary | ICD-10-CM | POA: Diagnosis not present

## 2018-03-10 ENCOUNTER — Telehealth: Payer: Self-pay | Admitting: Interventional Cardiology

## 2018-03-10 MED ORDER — ATORVASTATIN CALCIUM 10 MG PO TABS: 10.0000 mg | ORAL_TABLET | Freq: Every day | ORAL | 3 refills | Status: DC

## 2018-03-10 NOTE — Telephone Encounter (Signed)
Notes recorded by [name], MD on 02/22/2018 at 5:44 PM EDT Let the patient know lipids are too high. We need to use more the current dose of atorvastatin. LDL needs to be near 70. Weight loss and aerobic activity will help. Can we try to take atorvastatin 10 mg every day? If so and tolerated well, repeat lipid panel 6 to 8 weeks after increase in dose. So find out who her new primary care physician is. Is no longer Dr. Cheron Schaumann A copy will be sent to Jonathon Jordan, MD   Spoke with pt and went over results and recommendations per Dr. [name].  Pt agreeable to try Atorvastatin daily.  She will call me in about 3 weeks with an update and set up labs at that time if tolerating increased dose of Atorvastatin.

## 2018-03-10 NOTE — Telephone Encounter (Signed)
New Message ° ° ° ° ° °Patient returned your call, pls call again. °

## 2018-04-18 ENCOUNTER — Telehealth: Payer: Self-pay | Admitting: Interventional Cardiology

## 2018-04-18 DIAGNOSIS — E785 Hyperlipidemia, unspecified: Secondary | ICD-10-CM

## 2018-04-18 NOTE — Telephone Encounter (Signed)
Called pt in regards to if she is tolerating Atorvastatin 10mg  QD.  If tolerating needs to be set up for fasting labs 6-8 weeks after initial dose change. Labs need to be scheduled sometime between 9/9-9/23.  Left message to call back

## 2018-04-18 NOTE — Telephone Encounter (Signed)
Spoke with pt and she said she did not last long at all on Atorvastatin 10mg  QD.  States she had terrible cramps in her legs.  She has went back to taking it only on MWF.  Will route to Dr. [name] for review.

## 2018-04-20 NOTE — Telephone Encounter (Signed)
Is she willing to consider PCSK9 via the lipid clinic?

## 2018-04-21 NOTE — Telephone Encounter (Signed)
Left message to call back  

## 2018-04-24 ENCOUNTER — Telehealth: Payer: Self-pay | Admitting: Interventional Cardiology

## 2018-04-24 NOTE — Telephone Encounter (Signed)
New Message: ° ° ° °Patient is returning a call °

## 2018-04-24 NOTE — Telephone Encounter (Signed)
LVM for return call. 

## 2018-04-24 NOTE — Telephone Encounter (Signed)
Follow up  ° ° °Patient is returning your call. °

## 2018-04-24 NOTE — Telephone Encounter (Signed)
Attempted to call the pt back and no answer with no vm set-up at that time.

## 2018-04-28 NOTE — Telephone Encounter (Signed)
Spoke with pt and she states that she is willing to speak to PharmDs and consider PCSK9 inhibitors.  Advised I will place referral and schedulers will contact her to make an appt.  Pt appreciative for call.   Referral placed

## 2018-04-28 NOTE — Telephone Encounter (Signed)
Attempted to contact pt.  Picked up phone and hung up.  Will try again later.

## 2018-05-04 ENCOUNTER — Ambulatory Visit: Payer: No Typology Code available for payment source

## 2018-05-12 DIAGNOSIS — M25549 Pain in joints of unspecified hand: Secondary | ICD-10-CM | POA: Diagnosis not present

## 2018-05-12 DIAGNOSIS — M79644 Pain in right finger(s): Secondary | ICD-10-CM | POA: Diagnosis not present

## 2018-05-15 DIAGNOSIS — H401122 Primary open-angle glaucoma, left eye, moderate stage: Secondary | ICD-10-CM | POA: Diagnosis not present

## 2018-05-15 DIAGNOSIS — H401113 Primary open-angle glaucoma, right eye, severe stage: Secondary | ICD-10-CM | POA: Diagnosis not present

## 2018-05-26 ENCOUNTER — Ambulatory Visit: Payer: No Typology Code available for payment source

## 2018-06-15 ENCOUNTER — Telehealth: Payer: Self-pay | Admitting: Interventional Cardiology

## 2018-06-15 NOTE — Telephone Encounter (Signed)
Will route to billing dept to contact pt on if insurance covers visit.

## 2018-06-15 NOTE — Telephone Encounter (Signed)
New Message:     Pt called and said Dr [name] wanted her to go to the [facility]. She said if her insurance will not pay for it, she will not be able to afford it.

## 2018-06-16 ENCOUNTER — Other Ambulatory Visit: Payer: Self-pay | Admitting: Family Medicine

## 2018-06-16 DIAGNOSIS — Z1231 Encounter for screening mammogram for malignant neoplasm of breast: Secondary | ICD-10-CM

## 2018-06-16 NOTE — Telephone Encounter (Signed)
Returned call to patient, leaving voice mail message indicating that her insurance should cover the lipid clinic.  Also let her know it would be billed as an office visit with her high cholesterol diagnosis and for her to call us back should she have further questions.

## 2018-07-10 ENCOUNTER — Encounter: Payer: Self-pay | Admitting: Interventional Cardiology

## 2018-07-10 ENCOUNTER — Telehealth: Payer: Self-pay | Admitting: Interventional Cardiology

## 2018-07-10 ENCOUNTER — Ambulatory Visit (INDEPENDENT_AMBULATORY_CARE_PROVIDER_SITE_OTHER): Payer: No Typology Code available for payment source | Admitting: Interventional Cardiology

## 2018-07-10 VITALS — BP 152/98 | HR 65 | Ht 61.5 in | Wt 185.1 lb

## 2018-07-10 DIAGNOSIS — I25810 Atherosclerosis of coronary artery bypass graft(s) without angina pectoris: Secondary | ICD-10-CM

## 2018-07-10 DIAGNOSIS — E785 Hyperlipidemia, unspecified: Secondary | ICD-10-CM | POA: Diagnosis not present

## 2018-07-10 DIAGNOSIS — I10 Essential (primary) hypertension: Secondary | ICD-10-CM | POA: Diagnosis not present

## 2018-07-10 DIAGNOSIS — R42 Dizziness and giddiness: Secondary | ICD-10-CM | POA: Diagnosis not present

## 2018-07-10 MED ORDER — AMLODIPINE BESYLATE 5 MG PO TABS: 5.0000 mg | ORAL_TABLET | Freq: Every day | ORAL | 3 refills | Status: DC

## 2018-07-10 MED ORDER — LOSARTAN POTASSIUM-HCTZ 50-12.5 MG PO TABS: 1.0000 | ORAL_TABLET | Freq: Every day | ORAL | 3 refills | Status: DC

## 2018-07-10 NOTE — Telephone Encounter (Signed)
New Message   STAT if patient feels like he/she is going to faint   1) Are you dizzy now? no  2) Do you feel faint or have you passed out? No and feels faint only when the vertigo hits  3) Do you have any other symptoms? Pt states she would like to speak with a nurse because she has been experiencing severe vertigo, vomiting and a headache  4) Have you checked your HR and BP (record if available)? No but states she will have someone check

## 2018-07-10 NOTE — Patient Instructions (Signed)
Medication Instructions:  Your physician recommends that you continue on your current medications as directed. Please refer to the Current Medication list given to you today.  If you need a refill on your cardiac medications before your next appointment, please call your pharmacy.   Lab work: None ordered If you have labs (blood work) drawn today and your tests are completely normal, you will receive your results only by: Marland Kitchen MyChart Message (if you have MyChart) OR . A paper copy in the mail If you have any lab test that is abnormal or we need to change your treatment, we will call you to review the results.  Testing/Procedures: None ordered  Follow-Up: At [facility], you and your health needs are our priority.  As part of our continuing mission to provide you with exceptional heart care, we have created designated Provider Care Teams.  These Care Teams include your primary Cardiologist (physician) and Advanced Practice Providers (APPs -  Physician Assistants and Nurse Practitioners) who all work together to provide you with the care you need, when you need it. . You will need a follow up appointment in July 2020.  Please call our office 2 months in advance to schedule this appointment.  You may see [name], MD or one of the following Advanced Practice Providers on your designated Care Team:   . Truitt Merle, NP . Cecilie Kicks, NP . Kathyrn Drown, NP   Any Other Special Instructions Will Be Listed Below (If Applicable).

## 2018-07-10 NOTE — Telephone Encounter (Signed)
I spoke with pt who reports last Tuesday (11/19) she began having episodes where room is spinning and she can't walk for a minute.Felt nauseous also  Saw primary care on 11/19 and was diagnosed with vertigo. Treated with meclizine and zofran.  She states she keeps having episodes.  Yesterday while at church felt dizzy and short of breath. No chest pain. BP 168/100, heart rate 68 today.  Feels very tired and is concerned symptoms could be heart related.  I scheduled pt to see Dr. [name] today at 1:20

## 2018-07-10 NOTE — Progress Notes (Signed)
Cardiology Office Note:    Date:  07/10/2018   ID:  Carolyn Fleming, DOB 05/21/1951, MRN [id number]  PCP:  Maurice Small, MD  Cardiologist:  No primary care provider on file.   Referring MD: No ref. provider found   Chief Complaint  Patient presents with  . Dizziness    History of Present Illness:    Carolyn Fleming is a 67 y.o. female with a hx of CAD, prior anterior non-ST elevation MI, coronary bypass grafting with LIMA to LAD, SVG to OM 2013, hypertension,abnormal right LE waveforms hyperlipidemia and BMI 35.   1 week ago, Carolyn Fleming started having difficulty with episodes of vertiginous dizziness followed by nausea.  She has had multiple recurring episodes.  They tend to occur with change in position.  She has been started on meclizine and Zofran by primary physician, Dr. Justin Mend.  Episodes come and go.  They can occur at rest.  Meclizine has not brought about significant improvement.  She comes in today because she is concerned that this may be related to her heart/or causing heart attack.    Past Medical History:  Diagnosis Date  . Arthritis   . Asthma   . Breast cancer ([place])    Right breast   . GERD (gastroesophageal reflux disease)   . Hyperlipidemia   . Hypertension 01/25/2012  . Obesity (BMI 35.0-39.9 without comorbidity) 01/25/2012  . S/P CABG x 2 01/24/2012   Emergency LIMA to LAD, SVG to OM1, EVH via right thigh  . Sleep apnea    uses cpap    Past Surgical History:  Procedure Laterality Date  . ABDOMINAL HYSTERECTOMY    . BREAST LUMPECTOMY  1997   breast cancer  . CARDIAC CATHETERIZATION N/A 05/07/2015   Procedure: Left Heart Cath and Cors/Grafts Angiography;  Surgeon: [name], MD;  Location: [place] [facility];  Service: Cardiovascular;  Laterality: N/A;  . CORONARY ARTERY BYPASS GRAFT  01/24/2012   Procedure: CORONARY ARTERY BYPASS GRAFTING (CABG);  Surgeon: [name], MD;  Location: [facility];  Service: Open Heart Surgery;  Laterality: N/A;  Coronary  Artery bypass Graft times two utilizing the left internal mammary artery and the right greater saphenous vein harvested endoscopically,  Transesophageal Echocardiogram  . LEFT HEART CATHETERIZATION WITH CORONARY ANGIOGRAM N/A 01/24/2012   Procedure: LEFT HEART CATHETERIZATION WITH CORONARY ANGIOGRAM;  Surgeon: [name], MD;  Location: [facility] C[facility];  Service: Cardiovascular;  Laterality: N/A;  . TONSILLECTOMY    . TUBAL LIGATION      Current Medications: Current Meds  Medication Sig  . ALPHAGAN P 0.1 % SOLN   . amLODipine (NORVASC) 5 MG tablet Take 1 tablet (5 mg total) by mouth daily.  Marland Kitchen aspirin (ASPIRIN 81) 81 MG chewable tablet Aspir-81  . atorvastatin (LIPITOR) 10 MG tablet Take 1 tablet (10 mg total) by mouth daily at 6 PM.  . azelastine (OPTIVAR) 0.05 % ophthalmic solution INSTILL 1 DROP INTO AFFECTED EYE TWICE A DAY  . Azelastine HCl 0.15 % SOLN azelastine 0.15 % (205.5 mcg) nasal spray  USE 1 SPRAY INTO EACH NOSTRIL TWICE A DAY  . benzonatate (TESSALON) 200 MG capsule benzonatate 200 mg capsule  1 CAPSULE THREE TIMES A DAY AS NEEDED FOR COUGH FOR 10 DAYS  . brimonidine (ALPHAGAN P) 0.1 % SOLN Place 1 drop into the right eye 2 times daily.  . dorzolamide (TRUSOPT) 2 % ophthalmic solution Place 1 drop into both eyes 2 (two) times daily.   Marland Kitchen  fluocinonide ointment (LIDEX) 0.05 % fluocinonide 0.05 % topical ointment  APPLY 1 APPLICATION TO ITCHY AREAS TWICE A DAY  . loratadine (CLARITIN) 10 MG tablet Take 10 mg by mouth daily.  Marland Kitchen losartan-hydrochlorothiazide (HYZAAR) 50-12.5 MG tablet Take 1 tablet by mouth daily.  . meclizine (ANTIVERT) 12.5 MG tablet Take 1 tablet (12.5 mg total) by mouth 3 (three) times daily as needed for dizziness.  Marland Kitchen NITROSTAT 0.4 MG SL tablet Place 1 tablet under the tongue every 5 (five) minutes as needed for chest pain.   Marland Kitchen ondansetron (ZOFRAN) 8 MG tablet TAKE 1 TABLET BY MOUTH TWICE A DAY AS NEEDED FOR NAUSEA  . oxyCODONE-acetaminophen  (PERCOCET/ROXICET) 5-325 MG tablet TAKE 1-2 TABS BY MOUTH EVERY 6 HOURS AS NEEDED SEVERE PAIN  . RESTORA RX 60-1.25 MG CAPS Take 1 capsule by mouth daily.  . RHOPRESSA 0.02 % SOLN   . timolol (BETIMOL) 0.25 % ophthalmic solution Place 1 drop into both eyes every morning.   . Travoprost, BAK Free, (TRAVATAN Z) 0.004 % SOLN ophthalmic solution Travatan Z 0.004 % eye drops  INSTILL 1 DROP INTO BOTH EYES EVERY DAY AT NIGHT  . triamcinolone ointment (KENALOG) 0.1 % APPLY TO AFFECTED AREA TWICE A DAY     Allergies:   Codeine; Latex; and Sulfonamide derivatives   Social History   Socioeconomic History  . Marital status: Divorced    Spouse name: Not on file  . Number of children: Not on file  . Years of education: Not on file  . Highest education level: Not on file  Occupational History  . Not on file  Social Needs  . Financial resource strain: Not on file  . Food insecurity:    Worry: Not on file    Inability: Not on file  . Transportation needs:    Medical: Not on file    Non-medical: Not on file  Tobacco Use  . Smoking status: Never Smoker  . Smokeless tobacco: Never Used  Substance and Sexual Activity  . Alcohol use: No  . Drug use: No  . Sexual activity: Never    Comment: 1st intercourse- 26,  partners- 2   Lifestyle  . Physical activity:    Days per week: Not on file    Minutes per session: Not on file  . Stress: Not on file  Relationships  . Social connections:    Talks on phone: Not on file    Gets together: Not on file    Attends religious service: Not on file    Active member of club or organization: Not on file    Attends meetings of clubs or organizations: Not on file    Relationship status: Not on file  Other Topics Concern  . Not on file  Social History Narrative  . Not on file     Family History: The patient's family history includes Cancer in her brother and mother; Diabetes in her brother, mother, and sister; Hypertension in her brother and  mother.  ROS:   Please see the history of present illness.     All other systems reviewed and are negative.  EKGs/Labs/Other Studies Reviewed:    The following studies were reviewed today: No new or recent studies.  EKG:  EKG is  ordered today.  The ekg ordered today demonstrates sinus rhythm at 65 bpm with poor R wave progression V1 through V5 6.  Anterior T wave abnormality, low voltage, and when compared to prior tracing the heart rate is slower.  Recent  Labs: 02/22/2018: ALT 33; BUN 16; Creatinine, Ser 1.28; Potassium 4.1; Sodium 140  Recent Lipid Panel    Component Value Date/Time   CHOL 217 (H) 02/22/2018 0910   TRIG 237 (H) 02/22/2018 0910   HDL 43 02/22/2018 0910   CHOLHDL 5.0 (H) 02/22/2018 0910   CHOLHDL 4.4 12/12/2015 0828   VLDL 29 12/12/2015 0828   LDLCALC 127 (H) 02/22/2018 0910   LDLDIRECT 149.1 05/28/2013 0946    Physical Exam:    VS:  BP (!) 152/98   Pulse 65   Ht 5' 1.5" (1.562 m)   Wt 185 lb 1.9 oz (84 kg)   SpO2 96%   BMI 34.41 kg/m     Wt Readings from Last 3 Encounters:  07/10/18 185 lb 1.9 oz (84 kg)  02/22/18 184 lb (83.5 kg)  07/21/17 191 lb (86.6 kg)     GEN:  Well nourished, well developed in no acute distress HEENT: Normal NECK: No JVD. LYMPHATICS: No lymphadenopathy CARDIAC: RRR, no murmur, no gallop, no edema. VASCULAR: 2+ bilateral radial and carotid pulses.  No carotid bruits. RESPIRATORY:  Clear to auscultation without rales, wheezing or rhonchi  ABDOMEN: Soft, non-tender, non-distended, No pulsatile mass, MUSCULOSKELETAL: No deformity  SKIN: Warm and dry NEUROLOGIC:  Alert and oriented x 3 PSYCHIATRIC:  Normal affect   ASSESSMENT:    1. Dizziness   2. Essential hypertension   3. Hyperlipidemia, unspecified hyperlipidemia type   4. CAD of autologous bypass graft    PLAN:    In order of problems listed above:  1. Vertigo, not felt to be related to posterior circulation.  No neurologic deficits are noted. 2. Elevated  today, probably because of anxiety. 3. LDL target less than 70.  Home therapy. 4. No clinical evidence of ongoing cardiac ischemia/CHF.  Follow-up with primary care concerning vertigo.  Reassured that there does not appear to be an acute cardiac situation going on.   Medication Adjustments/Labs and Tests Ordered: Current medicines are reviewed at length with the patient today.  Concerns regarding medicines are outlined above.  Orders Placed This Encounter  Procedures  . EKG 12-Lead   No orders of the defined types were placed in this encounter.   Patient Instructions  Medication Instructions:  Your physician recommends that you continue on your current medications as directed. Please refer to the Current Medication list given to you today.  If you need a refill on your cardiac medications before your next appointment, please call your pharmacy.   Lab work: None ordered If you have labs (blood work) drawn today and your tests are completely normal, you will receive your results only by: Marland Kitchen MyChart Message (if you have MyChart) OR . A paper copy in the mail If you have any lab test that is abnormal or we need to change your treatment, we will call you to review the results.  Testing/Procedures: None ordered  Follow-Up: At [facility], you and your health needs are our priority.  As part of our continuing mission to provide you with exceptional heart care, we have created designated Provider Care Teams.  These Care Teams include your primary Cardiologist (physician) and Advanced Practice Providers (APPs -  Physician Assistants and Nurse Practitioners) who all work together to provide you with the care you need, when you need it. . You will need a follow up appointment in July 2020.  Please call our office 2 months in advance to schedule this appointment.  You may see [name] III,  MD or one of the following Advanced Practice Providers on your designated Care Team:   . Truitt Merle, NP . Cecilie Kicks, NP . Kathyrn Drown, NP   Any Other Special Instructions Will Be Listed Below (If Applicable).     Signed, [name], MD  07/10/2018 1:57 PM    [facility]

## 2018-07-25 ENCOUNTER — Encounter: Payer: PRIVATE HEALTH INSURANCE | Admitting: Obstetrics & Gynecology

## 2018-07-27 ENCOUNTER — Ambulatory Visit: Payer: No Typology Code available for payment source

## 2018-08-04 ENCOUNTER — Ambulatory Visit
Admission: RE | Admit: 2018-08-04 | Discharge: 2018-08-04 | Disposition: A | Discharge status: Home / Self Care | Payer: No Typology Code available for payment source | Source: Ambulatory Visit | Attending: Family Medicine | Admitting: Family Medicine

## 2018-08-04 DIAGNOSIS — Z1231 Encounter for screening mammogram for malignant neoplasm of breast: Secondary | ICD-10-CM

## 2018-09-07 DIAGNOSIS — H401113 Primary open-angle glaucoma, right eye, severe stage: Secondary | ICD-10-CM | POA: Diagnosis not present

## 2018-09-07 DIAGNOSIS — H401122 Primary open-angle glaucoma, left eye, moderate stage: Secondary | ICD-10-CM | POA: Diagnosis not present

## 2018-09-20 DIAGNOSIS — I739 Peripheral vascular disease, unspecified: Secondary | ICD-10-CM | POA: Diagnosis not present

## 2018-09-26 ENCOUNTER — Telehealth: Payer: Self-pay | Admitting: Interventional Cardiology

## 2018-09-26 NOTE — Telephone Encounter (Signed)
New Message:     Pt t says she is in an exercise program. She wants to know what is the most that she can lift in weighs? If she is not there, please leave a message.

## 2018-09-30 NOTE — Telephone Encounter (Signed)
30 ponund.

## 2018-10-02 DIAGNOSIS — H401122 Primary open-angle glaucoma, left eye, moderate stage: Secondary | ICD-10-CM | POA: Diagnosis not present

## 2018-10-02 DIAGNOSIS — H401113 Primary open-angle glaucoma, right eye, severe stage: Secondary | ICD-10-CM | POA: Diagnosis not present

## 2018-10-02 NOTE — Telephone Encounter (Signed)
Spoke with pt and made her aware of recommendations per Dr. Nehme.  Pt verbalized understanding and was appreciative for call.  °

## 2018-10-18 DIAGNOSIS — Z01419 Encounter for gynecological examination (general) (routine) without abnormal findings: Secondary | ICD-10-CM | POA: Diagnosis not present

## 2018-10-18 DIAGNOSIS — Z6835 Body mass index (BMI) 35.0-35.9, adult: Secondary | ICD-10-CM | POA: Diagnosis not present

## 2018-10-26 ENCOUNTER — Encounter: Payer: PRIVATE HEALTH INSURANCE | Admitting: Gynecology

## 2018-10-30 DIAGNOSIS — H401122 Primary open-angle glaucoma, left eye, moderate stage: Secondary | ICD-10-CM | POA: Diagnosis not present

## 2018-10-30 DIAGNOSIS — H401113 Primary open-angle glaucoma, right eye, severe stage: Secondary | ICD-10-CM | POA: Diagnosis not present

## 2019-02-08 DIAGNOSIS — H401113 Primary open-angle glaucoma, right eye, severe stage: Secondary | ICD-10-CM | POA: Diagnosis not present

## 2019-02-08 DIAGNOSIS — H401122 Primary open-angle glaucoma, left eye, moderate stage: Secondary | ICD-10-CM | POA: Diagnosis not present

## 2019-02-26 DIAGNOSIS — J0100 Acute maxillary sinusitis, unspecified: Secondary | ICD-10-CM | POA: Diagnosis not present

## 2019-03-05 DIAGNOSIS — H401122 Primary open-angle glaucoma, left eye, moderate stage: Secondary | ICD-10-CM | POA: Diagnosis not present

## 2019-03-05 DIAGNOSIS — H401113 Primary open-angle glaucoma, right eye, severe stage: Secondary | ICD-10-CM | POA: Diagnosis not present

## 2019-03-22 DIAGNOSIS — G4733 Obstructive sleep apnea (adult) (pediatric): Secondary | ICD-10-CM | POA: Diagnosis not present

## 2019-03-27 ENCOUNTER — Telehealth: Payer: Self-pay | Admitting: Interventional Cardiology

## 2019-03-27 DIAGNOSIS — H401113 Primary open-angle glaucoma, right eye, severe stage: Secondary | ICD-10-CM | POA: Diagnosis not present

## 2019-03-27 DIAGNOSIS — Z01812 Encounter for preprocedural laboratory examination: Secondary | ICD-10-CM | POA: Diagnosis not present

## 2019-03-27 DIAGNOSIS — Z1159 Encounter for screening for other viral diseases: Secondary | ICD-10-CM | POA: Diagnosis not present

## 2019-03-27 DIAGNOSIS — Z20828 Contact with and (suspected) exposure to other viral communicable diseases: Secondary | ICD-10-CM | POA: Diagnosis not present

## 2019-03-27 NOTE — Telephone Encounter (Signed)
New Message      [facility] Pre-operative Risk Assessment    Request for surgical clearance:  1. What type of surgery is being performed? Trabeculectomy of the right eye   2. When is this surgery scheduled? 04/03/19   3. What type of clearance is required (medical clearance vs. Pharmacy clearance to hold med vs. Both)? Pharmacy   4. Are there any medications that need to be held prior to surgery and how long? Hold aspirin 7 days prior   5. Practice name and name of physician performing surgery? [facility]    6. What is your office phone number [phone]    7.   What is your office fax number [phone]  8.   Anesthesia type (None, local, MAC, general) ? GMO with a retrobulbar     Carolyn Fleming 03/27/2019, 11:33 AM  _________________________________________________________________   (provider comments below)

## 2019-03-27 NOTE — Telephone Encounter (Signed)
Spoke to pt. She stated she was called by surgeons office and told to take her ASA today and tomorrow. Informed pt that she is cleared for surgery and gave recommendations. Pt stated she has been off of her ASA since 03/19/19. Informed pt I would send a message to find out if pt needs to take ASA tomorrow or hold it. Pt verbalized thanks.  Please advise.

## 2019-03-27 NOTE — Telephone Encounter (Signed)
   Primary Cardiologist: [name], MD  Chart reviewed as part of pre-operative protocol coverage. Received request to hold ASA for upcoming eye procedure. We generally do not interrupt ASA therapy after CABG. However, if necessary, may hold 5-7 days before procedure.    I will route this recommendation to the requesting party via Epic fax function and remove from pre-op pool.  Please call with questions.  Tami Lin Duke, PA 03/27/2019, 3:50 PM

## 2019-03-27 NOTE — Telephone Encounter (Signed)
Attempted to reach pt to inform that she is cleared. Phone rings and then hangs up. Attempts x2.

## 2019-03-27 NOTE — Telephone Encounter (Signed)
Follow up ° ° °Patient is returning your call. Please call. ° ° ° °

## 2019-03-28 NOTE — Telephone Encounter (Signed)
OK to hold aspirin 5-7 days pre op if needed- she should take ASA 81 mg up until that time.  Kerin Ransom PA-C 03/28/2019 1:27 PM

## 2019-03-28 NOTE — Telephone Encounter (Addendum)
Patient notified but stated she was told to hold asa until her procedure and she states she does think that is correct and wanted to reach out to a provider for clarification.   I spoke with Lurena Joiner and he stated for patient to hold Asa until her surgery.   Patient notified directly of Runell Gess recommendations and voiced understanding.

## 2019-04-03 DIAGNOSIS — H401113 Primary open-angle glaucoma, right eye, severe stage: Secondary | ICD-10-CM | POA: Diagnosis not present

## 2019-04-04 DIAGNOSIS — Z79899 Other long term (current) drug therapy: Secondary | ICD-10-CM | POA: Diagnosis not present

## 2019-04-04 DIAGNOSIS — Z9889 Other specified postprocedural states: Secondary | ICD-10-CM | POA: Diagnosis not present

## 2019-04-04 DIAGNOSIS — Z7952 Long term (current) use of systemic steroids: Secondary | ICD-10-CM | POA: Diagnosis not present

## 2019-04-04 DIAGNOSIS — H401113 Primary open-angle glaucoma, right eye, severe stage: Secondary | ICD-10-CM | POA: Diagnosis not present

## 2019-04-14 ENCOUNTER — Other Ambulatory Visit: Payer: Self-pay | Admitting: Interventional Cardiology

## 2019-05-06 ENCOUNTER — Other Ambulatory Visit: Payer: Self-pay | Admitting: Interventional Cardiology

## 2019-05-07 ENCOUNTER — Other Ambulatory Visit: Payer: Self-pay

## 2019-05-07 MED ORDER — AMLODIPINE BESYLATE 5 MG PO TABS: 5.0000 mg | ORAL_TABLET | Freq: Every day | ORAL | 0 refills | Status: DC

## 2019-05-23 ENCOUNTER — Encounter: Payer: Self-pay | Admitting: Gynecology

## 2019-05-23 ENCOUNTER — Other Ambulatory Visit: Payer: Self-pay | Admitting: Family Medicine

## 2019-05-23 DIAGNOSIS — Z1231 Encounter for screening mammogram for malignant neoplasm of breast: Secondary | ICD-10-CM

## 2019-05-27 NOTE — Progress Notes (Signed)
Cardiology Office Note:    Date:  05/29/2019   ID:  Carolyn Fleming, DOB 04-27-1951, MRN [id number]  PCP:  Maurice Small, MD  Cardiologist:  [name], MD   Referring MD: Maurice Small, MD   Chief Complaint  Patient presents with  . Coronary Artery Disease    History of Present Illness:    Carolyn Fleming is a 68 y.o. female with a hx of CAD, prior anterior non-ST elevation MI, coronary bypass graftingwith LIMA to LAD, SVG to OM 2013, hypertension,abnormal right LE waveformshyperlipidemia and BMI 35.   She is doing relatively well.  No cardiac complaints.  She denies any.  No shortness of breath.  She has not had syncope or palpitations.  No nitroglycerin use.  Physically and active.  Compliant with medications.  Not currently on sufficient therapy for LDL target achievement.  Past Medical History:  Diagnosis Date  . Arthritis   . Asthma   . Breast cancer ([place])    Right breast   . GERD (gastroesophageal reflux disease)   . Hyperlipidemia   . Hypertension 01/25/2012  . Obesity (BMI 35.0-39.9 without comorbidity) 01/25/2012  . S/P CABG x 2 01/24/2012   Emergency LIMA to LAD, SVG to OM1, EVH via right thigh  . Sleep apnea    uses cpap    Past Surgical History:  Procedure Laterality Date  . ABDOMINAL HYSTERECTOMY    . BREAST LUMPECTOMY  1997   breast cancer  . CARDIAC CATHETERIZATION N/A 05/07/2015   Procedure: Left Heart Cath and Cors/Grafts Angiography;  Surgeon: [name], MD;  Location: [place] [facility];  Service: Cardiovascular;  Laterality: N/A;  . CORONARY ARTERY BYPASS GRAFT  01/24/2012   Procedure: CORONARY ARTERY BYPASS GRAFTING (CABG);  Surgeon: [name], MD;  Location: [place];  Service: Open Heart Surgery;  Laterality: N/A;  Coronary Artery bypass Graft times two utilizing the left internal mammary artery and the right greater saphenous vein harvested endoscopically,  Transesophageal Echocardiogram  . LEFT HEART CATHETERIZATION WITH CORONARY  ANGIOGRAM N/A 01/24/2012   Procedure: LEFT HEART CATHETERIZATION WITH CORONARY ANGIOGRAM;  Surgeon: [name], MD;  Location: [facility];  Service: Cardiovascular;  Laterality: N/A;  . TONSILLECTOMY    . TUBAL LIGATION      Current Medications: Current Meds  Medication Sig  . ALPHAGAN P 0.1 % SOLN   . amLODipine (NORVASC) 5 MG tablet Take 1 tablet (5 mg total) by mouth daily.  Marland Kitchen aspirin (ASPIRIN 81) 81 MG chewable tablet Aspir-81  . atorvastatin (LIPITOR) 10 MG tablet Take 1 tablet (10 mg total) by mouth daily at 6 PM.  . Azelastine HCl 0.15 % SOLN azelastine 0.15 % (205.5 mcg) nasal spray  USE 1 SPRAY INTO EACH NOSTRIL TWICE A DAY  . benzonatate (TESSALON) 200 MG capsule benzonatate 200 mg capsule  1 CAPSULE THREE TIMES A DAY AS NEEDED FOR COUGH FOR 10 DAYS  . dorzolamide (TRUSOPT) 2 % ophthalmic solution Place 1 drop into both eyes 2 (two) times daily.   . fluocinonide ointment (LIDEX) 0.05 % fluocinonide 0.05 % topical ointment  APPLY 1 APPLICATION TO ITCHY AREAS TWICE A DAY  . loratadine (CLARITIN) 10 MG tablet Take 10 mg by mouth daily.  Marland Kitchen losartan-hydrochlorothiazide (HYZAAR) 50-12.5 MG tablet Take 1 tablet by mouth daily. Please keep upcoming appt with Dr. [name] in November for future refills. Thank you  . meclizine (ANTIVERT) 12.5 MG tablet Take 1 tablet (12.5 mg total) by  mouth 3 (three) times daily as needed for dizziness.  Marland Kitchen NITROSTAT 0.4 MG SL tablet Place 1 tablet under the tongue every 5 (five) minutes as needed for chest pain.   Marland Kitchen ondansetron (ZOFRAN) 8 MG tablet TAKE 1 TABLET BY MOUTH TWICE A DAY AS NEEDED FOR NAUSEA  . Travoprost, BAK Free, (TRAVATAN Z) 0.004 % SOLN ophthalmic solution Travatan Z 0.004 % eye drops  INSTILL 1 DROP INTO BOTH EYES EVERY DAY AT NIGHT  . triamcinolone ointment (KENALOG) 0.1 % APPLY TO AFFECTED AREA TWICE A DAY     Allergies:   Codeine, Iodine, Latex, and Sulfonamide derivatives   Social History   Socioeconomic History  . Marital  status: Divorced    Spouse name: Not on file  . Number of children: Not on file  . Years of education: Not on file  . Highest education level: Not on file  Occupational History  . Not on file  Social Needs  . Financial resource strain: Not on file  . Food insecurity    Worry: Not on file    Inability: Not on file  . Transportation needs    Medical: Not on file    Non-medical: Not on file  Tobacco Use  . Smoking status: Never Smoker  . Smokeless tobacco: Never Used  Substance and Sexual Activity  . Alcohol use: No  . Drug use: No  . Sexual activity: Never    Comment: 1st intercourse- 26,  partners- 2   Lifestyle  . Physical activity    Days per week: Not on file    Minutes per session: Not on file  . Stress: Not on file  Relationships  . Social Herbalist on phone: Not on file    Gets together: Not on file    Attends religious service: Not on file    Active member of club or organization: Not on file    Attends meetings of clubs or organizations: Not on file    Relationship status: Not on file  Other Topics Concern  . Not on file  Social History Narrative  . Not on file     Family History: The patient's family history includes Cancer in her brother and mother; Diabetes in her brother, mother, and sister; Hypertension in her brother and mother.  ROS:   Please see the history of present illness.    Shingles now resolved.  Right knee arthroscopic surgery, now healed.  Recent right eye surgery for glaucoma, improved.  All other systems reviewed and are negative.  EKGs/Labs/Other Studies Reviewed:    The following studies were reviewed today: No new imaging or functional data.  EKG:  EKG performed today demonstrates normal sinus rhythm, precordial T wave abnormality that is nonspecific.  Otherwise normal.  When compared to November 2019, no significant changes noted.  Recent Labs: No results found for requested labs within last 8760 hours.  Recent Lipid  Panel    Component Value Date/Time   CHOL 217 (H) 02/22/2018 0910   TRIG 237 (H) 02/22/2018 0910   HDL 43 02/22/2018 0910   CHOLHDL 5.0 (H) 02/22/2018 0910   CHOLHDL 4.4 12/12/2015 0828   VLDL 29 12/12/2015 0828   LDLCALC 127 (H) 02/22/2018 0910   LDLDIRECT 149.1 05/28/2013 0946    Physical Exam:    VS:  BP (!) 138/93   Pulse 76   Ht 5' 1.5" (1.562 m)   Wt 194 lb 6.4 oz (88.2 kg)   SpO2 98%  BMI 36.14 kg/m     Wt Readings from Last 3 Encounters:  05/29/19 194 lb 6.4 oz (88.2 kg)  07/10/18 185 lb 1.9 oz (84 kg)  02/22/18 184 lb (83.5 kg)     GEN: Moderate obesity. No acute distress HEENT: Normal NECK: No JVD. LYMPHATICS: No lymphadenopathy CARDIAC:  RRR without murmur, gallop, or edema. VASCULAR:  Normal Pulses. No bruits. RESPIRATORY:  Clear to auscultation without rales, wheezing or rhonchi  ABDOMEN: Soft, non-tender, non-distended, No pulsatile mass, MUSCULOSKELETAL: No deformity  SKIN: Warm and dry NEUROLOGIC:  Alert and oriented x 3 PSYCHIATRIC:  Normal affect   ASSESSMENT:    1. CAD of autologous bypass graft   2. Essential hypertension   3. Hyperlipidemia, unspecified hyperlipidemia type   4. PAD (peripheral artery disease) ([place])   5. Educated about COVID-19 virus infection    PLAN:    In order of problems listed above:  1. Secondary prevention is discussed.  She is falling short of physical activity and lipid targets. 2. Blood pressure needs to be followed closely.  Diastolic pressure is 88 mmHg on recheck.  Low-salt diet is advocated. 3. Last LDL was from greater than a year ago when it was 127.  Current status not known.  She is on atorvastatin 3 times per week.  She has intolerance to higher doses.  A lipid panel will be obtained today.  She will be referred to Dr. Debara Pickett to help [place] achieve an aggressive LDL less than 70 and preferably near 55. 4. Secondary prevention.  I encouraged walking. 5. 3 WSR discussed, and endorsed as lifestyle changes.   Overall education and awareness concerning primary/secondary risk prevention was discussed in detail: LDL less than 70, hemoglobin A1c less than 7, blood pressure target less than 130/80 mmHg, >150 minutes of moderate aerobic activity per week, avoidance of smoking, weight control (via diet and exercise), and continued surveillance/management of/for obstructive sleep apnea.  Referred to Dr. Grier Mitts for primary care.    Medication Adjustments/Labs and Tests Ordered: Current medicines are reviewed at length with the patient today.  Concerns regarding medicines are outlined above.  No orders of the defined types were placed in this encounter.  No orders of the defined types were placed in this encounter.   There are no Patient Instructions on file for this visit.   Signed, [name], MD  05/29/2019 8:34 AM    [place]

## 2019-05-29 ENCOUNTER — Ambulatory Visit (INDEPENDENT_AMBULATORY_CARE_PROVIDER_SITE_OTHER): Payer: Medicare Other | Admitting: Interventional Cardiology

## 2019-05-29 ENCOUNTER — Other Ambulatory Visit: Payer: Self-pay

## 2019-05-29 ENCOUNTER — Encounter: Payer: Self-pay | Admitting: Interventional Cardiology

## 2019-05-29 ENCOUNTER — Encounter (INDEPENDENT_AMBULATORY_CARE_PROVIDER_SITE_OTHER): Payer: Self-pay

## 2019-05-29 VITALS — BP 138/93 | HR 76 | Ht 61.5 in | Wt 194.4 lb

## 2019-05-29 DIAGNOSIS — I739 Peripheral vascular disease, unspecified: Secondary | ICD-10-CM

## 2019-05-29 DIAGNOSIS — I25810 Atherosclerosis of coronary artery bypass graft(s) without angina pectoris: Secondary | ICD-10-CM

## 2019-05-29 DIAGNOSIS — I10 Essential (primary) hypertension: Secondary | ICD-10-CM

## 2019-05-29 DIAGNOSIS — E785 Hyperlipidemia, unspecified: Secondary | ICD-10-CM

## 2019-05-29 DIAGNOSIS — Z7189 Other specified counseling: Secondary | ICD-10-CM

## 2019-05-29 NOTE — Patient Instructions (Addendum)
Medication Instructions:  Your physician recommends that you continue on your current medications as directed. Please refer to the Current Medication list given to you today.  If you need a refill on your cardiac medications before your next appointment, please call your pharmacy.   Lab work: Lipid and Liver tomorrow  If you have labs (blood work) drawn today and your tests are completely normal, you will receive your results only by: Marland Kitchen MyChart Message (if you have MyChart) OR . A paper copy in the mail If you have any lab test that is abnormal or we need to change your treatment, we will call you to review the results.  Testing/Procedures: None  Follow-Up: At [facility], you and your health needs are our priority.  As part of our continuing mission to provide you with exceptional heart care, we have created designated Provider Care Teams.  These Care Teams include your primary Cardiologist (physician) and Advanced Practice Providers (APPs -  Physician Assistants and Nurse Practitioners) who all work together to provide you with the care you need, when you need it. You will need a follow up appointment in 12 months.  Please call our office 2 months in advance to schedule this appointment.  You may see [name], MD or one of the following Advanced Practice Providers on your designated Care Team:   Truitt Merle, NP Cecilie Kicks, NP . Kathyrn Drown, NP  Any Other Special Instructions Will Be Listed Below (If Applicable).   You have been referred to Dr. Debara Pickett with our [facility].  You have been referred to Dr. Grier Mitts to establish for Primary Care.

## 2019-05-29 NOTE — Addendum Note (Signed)
Addended by: Loren Racer on: 05/29/2019 08:56 AM   Modules accepted: Orders

## 2019-05-30 ENCOUNTER — Other Ambulatory Visit: Payer: Medicare Other | Admitting: *Deleted

## 2019-05-30 DIAGNOSIS — E785 Hyperlipidemia, unspecified: Secondary | ICD-10-CM

## 2019-05-30 DIAGNOSIS — I25810 Atherosclerosis of coronary artery bypass graft(s) without angina pectoris: Secondary | ICD-10-CM | POA: Diagnosis not present

## 2019-05-30 LAB — LIPID PANEL
Chol/HDL Ratio: 4.6 ratio — ABNORMAL HIGH (ref 0.0–4.4)
Cholesterol, Total: 173 mg/dL (ref 100–199)
HDL: 38 mg/dL — ABNORMAL LOW (ref >39)
LDL Chol Calc (NIH): 114 mg/dL — ABNORMAL HIGH (ref 0–99)
Triglycerides: 113 mg/dL (ref 0–149)
VLDL Cholesterol Cal: 21 mg/dL (ref 5–40)

## 2019-05-30 LAB — HEPATIC FUNCTION PANEL
ALT: 21 IU/L (ref 0–32)
AST: 18 IU/L (ref 0–40)
Albumin: 3.7 g/dL — ABNORMAL LOW (ref 3.8–4.8)
Alkaline Phosphatase: 88 IU/L (ref 39–117)
Bilirubin Total: 0.3 mg/dL (ref 0.0–1.2)
Bilirubin, Direct: 0.1 mg/dL (ref 0.00–0.40)
Total Protein: 6.4 g/dL (ref 6.0–8.5)

## 2019-05-31 ENCOUNTER — Other Ambulatory Visit: Payer: Self-pay | Admitting: Interventional Cardiology

## 2019-05-31 MED ORDER — NITROSTAT 0.4 MG SL SUBL: 0.4000 mg | SUBLINGUAL_TABLET | SUBLINGUAL | 6 refills | Status: DC | PRN

## 2019-05-31 NOTE — Telephone Encounter (Signed)
Pt's medication was sent to pt's pharmacy as requested. Confirmation received.  °

## 2019-06-04 ENCOUNTER — Encounter: Payer: Self-pay | Admitting: Internal Medicine

## 2019-06-04 ENCOUNTER — Ambulatory Visit (INDEPENDENT_AMBULATORY_CARE_PROVIDER_SITE_OTHER): Payer: Medicare Other | Admitting: Internal Medicine

## 2019-06-04 ENCOUNTER — Other Ambulatory Visit: Payer: Self-pay

## 2019-06-04 VITALS — BP 136/88 | HR 75 | Temp 97.2°F | Ht 61.5 in | Wt 197.6 lb

## 2019-06-04 DIAGNOSIS — I10 Essential (primary) hypertension: Secondary | ICD-10-CM

## 2019-06-04 DIAGNOSIS — E785 Hyperlipidemia, unspecified: Secondary | ICD-10-CM | POA: Diagnosis not present

## 2019-06-04 DIAGNOSIS — I25810 Atherosclerosis of coronary artery bypass graft(s) without angina pectoris: Secondary | ICD-10-CM

## 2019-06-04 DIAGNOSIS — I739 Peripheral vascular disease, unspecified: Secondary | ICD-10-CM | POA: Diagnosis not present

## 2019-06-04 NOTE — Patient Instructions (Signed)
Medication Instructions:  Dr. Debara Pickett recommends Repatha/Praluent (PCSK9). This is an injectable cholesterol medication self-administered. This medication will need prior approval with your insurance company, which we will work on. If the medication is not approved initially, we may need to do an appeal with your insurance. We will keep you updated on this process.   If you need co-pay assistance, please look into the program at [email or web address] >> disease funds >> hypercholesterolemia. This is an online application or you can call to completed.     *If you need a refill on your cardiac medications before your next appointment, please call your pharmacy*  Lab Work: FASTING lab work in 3-4 months to check cholesterol  If you have labs (blood work) drawn today and your tests are completely normal, you will receive your results only by: Marland Kitchen MyChart Message (if you have MyChart) OR . A paper copy in the mail If you have any lab test that is abnormal or we need to change your treatment, we will call you to review the results.  Testing/Procedures: NONE  Follow-Up: Dr. Debara Pickett recommends that you schedule a follow up visit with him the in the [place] in 3-4 months. Please have fasting blood work about 1 week prior to this visit and he will review the blood work results with you at your appointment.

## 2019-06-04 NOTE — Progress Notes (Signed)
LIPID CLINIC CONSULT NOTE  Chief Complaint:  Manage dyslipidemia  Primary Care Physician: Maurice Small, MD  Primary Cardiologist:  [name], MD  HPI:  Carolyn Fleming is a 68 y.o. female who is being seen today for the evaluation of dyslipidemia at the request of Meckenzie, Kellog, MD. This is a pleasant 68 year old female with history of coronary disease status post CABG x2 emergently with LIMA to LAD and SVG to [place] in 2013.  She also has hypertension, dyslipidemia and obesity.  She previously had had breast cancer of the right breast and underwent chemotherapy when she was in [place].  Her last cardiac catheterization was in September 2016, demonstrating total occlusion of the distal left main and patent SVG and LIMA grafts.  She has struggled with dyslipidemia and relative statin intolerance.  Although she has tried a number of statins before, she seems to be able to tolerate atorvastatin in the past.  Currently she is taking 10 mg 3 times weekly and her most recent lipid profile in October showed total cholesterol 173, triglycerides 113, HDL 38 and LDL 114.  PMHx:  Past Medical History:  Diagnosis Date   Arthritis    Asthma    Breast cancer ([place])    Right breast    GERD (gastroesophageal reflux disease)    Hyperlipidemia    Hypertension 01/25/2012   Obesity (BMI 35.0-39.9 without comorbidity) 01/25/2012   S/P CABG x 2 01/24/2012   Emergency LIMA to LAD, SVG to OM1, EVH via right thigh   Sleep apnea    uses cpap    Past Surgical History:  Procedure Laterality Date   ABDOMINAL HYSTERECTOMY     BREAST LUMPECTOMY  1997   breast cancer   CARDIAC CATHETERIZATION N/A 05/07/2015   Procedure: Left Heart Cath and Cors/Grafts Angiography;  Surgeon: [name], MD;  Location: [facility];  Service: Cardiovascular;  Laterality: N/A;   CORONARY ARTERY BYPASS GRAFT  01/24/2012   Procedure: CORONARY ARTERY BYPASS GRAFTING (CABG);  Surgeon: [name], MD;   Location: [facility];  Service: Open Heart Surgery;  Laterality: N/A;  Coronary Artery bypass Graft times two utilizing the left internal mammary artery and the right greater saphenous vein harvested endoscopically,  Transesophageal Echocardiogram   LEFT HEART CATHETERIZATION WITH CORONARY ANGIOGRAM N/A 01/24/2012   Procedure: LEFT HEART CATHETERIZATION WITH CORONARY ANGIOGRAM;  Surgeon: [name], MD;  Location: [facility];  Service: Cardiovascular;  Laterality: N/A;   TONSILLECTOMY     TUBAL LIGATION      FAMHx:  Family History  Problem Relation Age of Onset   Cancer Mother        Colon   Diabetes Mother    Hypertension Mother    Diabetes Sister    Cancer Brother        Lung   Diabetes Brother    Hypertension Brother     SOCHx:   reports that she has never smoked. She has never used smokeless tobacco. She reports that she does not drink alcohol or use drugs.  ALLERGIES:  Allergies  Allergen Reactions   Codeine Other (See Comments)    Lethargy   Iodine Other (See Comments)    Per allergy test   Latex     REACTION: hives   Statins Other (See Comments)    Atorvastatin, rosuvastatin   Sulfonamide Derivatives     REACTION: gi upset    ROS: Pertinent items noted in HPI and  remainder of comprehensive ROS otherwise negative.  HOME MEDS: Current Outpatient Medications on File Prior to Visit  Medication Sig Dispense Refill   amLODipine (NORVASC) 5 MG tablet Take 1 tablet (5 mg total) by mouth daily. 90 tablet 0   aspirin (ASPIRIN 81) 81 MG chewable tablet Aspir-81     atorvastatin (LIPITOR) 10 MG tablet Take 10 mg by mouth. 3 times weekly     Azelastine HCl 0.15 % SOLN azelastine 0.15 % (205.5 mcg) nasal spray  USE 1 SPRAY INTO EACH NOSTRIL TWICE A DAY     benzonatate (TESSALON) 200 MG capsule benzonatate 200 mg capsule  1 CAPSULE THREE TIMES A DAY AS NEEDED FOR COUGH FOR 10 DAYS     Dorzolamide HCl-Timolol Mal PF 2-0.5 % SOLN Apply 2 % to eye 2  (two) times daily at 10 AM and 5 PM.     fluocinonide ointment (LIDEX) 0.05 % fluocinonide 0.05 % topical ointment  APPLY 1 APPLICATION TO ITCHY AREAS TWICE A DAY     loratadine (CLARITIN) 10 MG tablet Take 10 mg by mouth daily.     losartan-hydrochlorothiazide (HYZAAR) 50-12.5 MG tablet Take 1 tablet by mouth daily. Please keep upcoming appt with Dr. [name] in November for future refills. Thank you 90 tablet 0   meclizine (ANTIVERT) 12.5 MG tablet Take 1 tablet (12.5 mg total) by mouth 3 (three) times daily as needed for dizziness. 90 tablet 0   NITROSTAT 0.4 MG SL tablet Place 1 tablet (0.4 mg total) under the tongue every 5 (five) minutes as needed for chest pain. 25 tablet 6   ondansetron (ZOFRAN) 8 MG tablet TAKE 1 TABLET BY MOUTH TWICE A DAY AS NEEDED FOR NAUSEA  1   Travoprost, BAK Free, (TRAVATAN Z) 0.004 % SOLN ophthalmic solution Travatan Z 0.004 % eye drops  INSTILL 1 DROP INTO BOTH EYES EVERY DAY AT NIGHT     triamcinolone ointment (KENALOG) 0.1 % APPLY TO AFFECTED AREA TWICE A DAY  5   No current facility-administered medications on file prior to visit.     LABS/IMAGING: No results found for this or any previous visit (from the past 48 hour(s)). No results found.  LIPID PANEL:    Component Value Date/Time   CHOL 173 05/30/2019 0726   TRIG 113 05/30/2019 0726   HDL 38 (L) 05/30/2019 0726   CHOLHDL 4.6 (H) 05/30/2019 0726   CHOLHDL 4.4 12/12/2015 0828   VLDL 29 12/12/2015 0828   LDLCALC 114 (H) 05/30/2019 0726   LDLDIRECT 149.1 05/28/2013 0946    WEIGHTS: Wt Readings from Last 3 Encounters:  06/04/19 197 lb 9.6 oz (89.6 kg)  05/29/19 194 lb 6.4 oz (88.2 kg)  07/10/18 185 lb 1.9 oz (84 kg)    VITALS: BP 136/88    Pulse 75    Temp (!) 97.2 F (36.2 C)    Ht 5' 1.5" (1.562 m)    Wt 197 lb 9.6 oz (89.6 kg)    SpO2 97%    BMI 36.73 kg/m   EXAM: General appearance: alert and no distress Neck: no carotid bruit, no JVD and thyroid not enlarged, symmetric, no  tenderness/mass/nodules Lungs: clear to auscultation bilaterally Heart: regular rate and rhythm Abdomen: soft, non-tender; bowel sounds normal; no masses,  no organomegaly Extremities: extremities normal, atraumatic, no cyanosis or edema Pulses: 2+ and symmetric Skin: Skin color, texture, turgor normal. No rashes or lesions Neurologic: Grossly normal Psych: Pleasant  EKG: Deferred  ASSESSMENT: 1. Mixed dyslipidemia, goal LDL<70 2. Relative  statin intolerance 3. Coronary artery disease status post two-vessel CABG (2013) 4. PAD 5. Hypertension  PLAN: 1.  Ms. Lomack has a mixed dyslipidemia and remains above target LDL less than 70.  She is currently on max tolerated statin dose which is atorvastatin 10 mg 3 times a week.  At higher doses and on different statin she has had significant myalgias.  Do not think that she will reach a target LDL on ezetimibe, and moreover would garner more cardiovascular risk reduction from a PCSK9 inhibitor given her prior CAD and PAD.  I would recommend starting PCSK9 inhibitor therapy with a plan to repeat lipids in about 3 to 4 months.  She should remain on her current dose of atorvastatin.  If there is a significant reduction in cholesterol and LDL is reduced below 25 then I would likely consider decreasing her atorvastatin to once weekly or a better tolerated dose.  Thanks again for the kind referral.  Pixie Casino, MD, FACC, [place] Director of the Advanced Lipid Disorders &  Cardiovascular Risk Reduction Clinic Diplomate of the American Board of Clinical Lipidology Attending Cardiologist  Direct Dial: [phone]   Fax: [phone]  Website:  [email or web address] Co 06/04/2019, 10:13 AM

## 2019-06-05 ENCOUNTER — Telehealth: Payer: Self-pay | Admitting: Interventional Cardiology

## 2019-06-05 ENCOUNTER — Other Ambulatory Visit: Payer: Self-pay | Admitting: Interventional Cardiology

## 2019-06-05 ENCOUNTER — Telehealth: Payer: Self-pay | Admitting: *Deleted

## 2019-06-05 ENCOUNTER — Telehealth: Payer: Self-pay | Admitting: Internal Medicine

## 2019-06-05 DIAGNOSIS — R5383 Other fatigue: Secondary | ICD-10-CM | POA: Diagnosis not present

## 2019-06-05 DIAGNOSIS — E039 Hypothyroidism, unspecified: Secondary | ICD-10-CM | POA: Diagnosis not present

## 2019-06-05 DIAGNOSIS — Z13228 Encounter for screening for other metabolic disorders: Secondary | ICD-10-CM | POA: Diagnosis not present

## 2019-06-05 DIAGNOSIS — F329 Major depressive disorder, single episode, unspecified: Secondary | ICD-10-CM | POA: Diagnosis not present

## 2019-06-05 MED ORDER — ATORVASTATIN CALCIUM 10 MG PO TABS: ORAL_TABLET | ORAL | 11 refills | Status: DC

## 2019-06-05 NOTE — Telephone Encounter (Signed)
PA for praluent 150mg /mL submitted via covermymeds.com (Key: [id number])  Patient can only take praluent or repatha pushtronex d/t latex allergy

## 2019-06-05 NOTE — Telephone Encounter (Signed)
Pt called back and asked what was the dosage of the Repath that Dr. Debara Pickett recommended. Looks like 140 mg I advised the pt. Pt is filling out assistance program forms.

## 2019-06-05 NOTE — Telephone Encounter (Signed)
Notes recorded by Michae Kava, CMA on 06/05/2019 at 3:59 PM EDT  Pt has been notified of lab results by phone with verbal understanding. Pt states she had appt with Dr. Debara Pickett recently and that he has suggested Repatha. She is currently looking at filling out assistance program forms to help her afford the [place]. Pt thanked me for the call. The patient has been notified of the result and verbalized understanding. All questions (if any) were answered.  Julaine Hua, [place] 06/05/2019 3:58 PM

## 2019-06-05 NOTE — Telephone Encounter (Addendum)
-----   Message from Fidel Levy, RN sent at 06/04/2019 11:16 AM EDT ----- Regarding: PA pcsk9 ID: [id number] BIN: [id number] PCN: [id number] RxGrp: [id number]  [facility]

## 2019-06-05 NOTE — Telephone Encounter (Signed)
Patient calling back to discuss Lab results

## 2019-06-06 ENCOUNTER — Telehealth: Payer: Self-pay | Admitting: *Deleted

## 2019-06-06 NOTE — Telephone Encounter (Signed)
Pt called to let Dr. [name] she will drop off her labs from PCP for Dr. [name] to review. I stated to her that we will have her labs scanned into her chart. Pt said thank you.

## 2019-06-07 ENCOUNTER — Telehealth: Payer: Self-pay | Admitting: Internal Medicine

## 2019-06-07 MED ORDER — PRALUENT 150 MG/ML ~~LOC~~ SOAJ: 1.0000 | SUBCUTANEOUS | 11 refills | Status: DC

## 2019-06-07 NOTE — Telephone Encounter (Signed)
Will forward to [employer] who has been working with PA

## 2019-06-07 NOTE — Telephone Encounter (Signed)
Approved. This drug has been approved under the Member's Medicare Part D benefit. Approved quantity: 2 units per 28 day(s). You may fill up to a 90 day supply except for those on Specialty Tier 5, which can be filled up to a 30 day supply. 

## 2019-06-07 NOTE — Addendum Note (Signed)
Addended by: Fidel Levy on: 06/07/2019 04:21 PM   Modules accepted: Orders

## 2019-06-07 NOTE — Telephone Encounter (Signed)
Patient called to stated she can't afford Alirocumab (PRALUENT) 150 MG/ML SOAJ, she wants to know if something else can be prescribed.

## 2019-06-07 NOTE — Telephone Encounter (Signed)
Spoke with patient about medication approval. She voiced understanding and would [facility] [employer] [facility] [address]. She will update [place] on co-pay and if she receives assistance thru [employer] or another foundation she has applied to.

## 2019-06-11 NOTE — Telephone Encounter (Signed)
LMTCB

## 2019-06-11 NOTE — Telephone Encounter (Signed)
Patient reports she applied for assistance thru [employer] services. She reports she was required to provide a credit card and then they charged her card. Explained I do not know anything this assistance program. She plans to cancel her account with them. She is waiting to hear back from [employer]wellfoundation but would like PASS application mailed. Will arrange for this.

## 2019-06-11 NOTE — Telephone Encounter (Signed)
Follow up: ° ° °Patient returning your call back. Please call patient back. °

## 2019-06-11 NOTE — Telephone Encounter (Signed)
Spoke with patient who reports she cannot afford Praluent - $160/month She is waiting on response from co-pay assistance programs she has applied for  If she is not approved, she may qualify for PASS based on income criteria [email or web address]  She will call back

## 2019-06-11 NOTE — Telephone Encounter (Signed)
New Message    Patient would like to speak to you about one of the assistance programs she was referred to.  She states they charged her and she didn't get approved yet and she would like to speak to you to see if that's how it normally works.

## 2019-06-12 NOTE — Telephone Encounter (Signed)
PASS app to be mailed by Allean Found CMA per communication 06/11/19

## 2019-06-15 ENCOUNTER — Telehealth: Payer: Self-pay | Admitting: Internal Medicine

## 2019-06-15 NOTE — Telephone Encounter (Signed)
°  New message    Patient requesting call from nurse to discuss injection

## 2019-06-15 NOTE — Telephone Encounter (Signed)
Spoke with patient and she will come in on Tuesday Nov 3 @ 1:15pm for first Praluent injection with [facility]

## 2019-06-15 NOTE — Telephone Encounter (Signed)
Spoke with patient who reports she was approved for [employer] co-pay grant for $2500. [facility]. She would like to do first injection in the office. Advised will coordinate with [facility] and call her back.   She is very appreciative of all the help she has received in getting her on this medication.

## 2019-06-18 ENCOUNTER — Telehealth: Payer: Self-pay | Admitting: Internal Medicine

## 2019-06-18 NOTE — Telephone Encounter (Signed)
°  Follow up     Patient calling to request Friday at [facility]

## 2019-06-18 NOTE — Telephone Encounter (Signed)
-----   Message from Rockne Menghini, [place] sent at 06/18/2019 12:29 PM EST ----- Regarding: RE: praluent 1st injection About the only other thing we can do this week is Friday - 9am or 2:30 pm  ----- Message ----- From: Fidel Levy, RN Sent: 06/18/2019  11:34 AM EST To: Cv Div Nl Anticoag Subject: praluent 1st injection                         Hello -- this patient was going to come tomorrow for praluent 1st injection but she is volunteering at the polls. Is there another day this week or next that she schedule looks OK for her to come?

## 2019-06-18 NOTE — Telephone Encounter (Signed)
LMTCB

## 2019-06-18 NOTE — Telephone Encounter (Signed)
Message sent to [facility] pharmacist with update on date/time for first injection

## 2019-06-21 DIAGNOSIS — L304 Erythema intertrigo: Secondary | ICD-10-CM | POA: Diagnosis not present

## 2019-06-22 DIAGNOSIS — Z7952 Long term (current) use of systemic steroids: Secondary | ICD-10-CM | POA: Diagnosis not present

## 2019-06-22 DIAGNOSIS — Z79899 Other long term (current) drug therapy: Secondary | ICD-10-CM | POA: Diagnosis not present

## 2019-06-22 DIAGNOSIS — H401122 Primary open-angle glaucoma, left eye, moderate stage: Secondary | ICD-10-CM | POA: Diagnosis not present

## 2019-06-22 DIAGNOSIS — H401113 Primary open-angle glaucoma, right eye, severe stage: Secondary | ICD-10-CM | POA: Diagnosis not present

## 2019-06-26 DIAGNOSIS — G4733 Obstructive sleep apnea (adult) (pediatric): Secondary | ICD-10-CM | POA: Diagnosis not present

## 2019-06-29 DIAGNOSIS — R799 Abnormal finding of blood chemistry, unspecified: Secondary | ICD-10-CM | POA: Diagnosis not present

## 2019-06-29 DIAGNOSIS — E039 Hypothyroidism, unspecified: Secondary | ICD-10-CM | POA: Diagnosis not present

## 2019-07-03 DIAGNOSIS — E039 Hypothyroidism, unspecified: Secondary | ICD-10-CM | POA: Diagnosis not present

## 2019-07-09 ENCOUNTER — Other Ambulatory Visit: Payer: Self-pay | Admitting: Interventional Cardiology

## 2019-07-27 ENCOUNTER — Other Ambulatory Visit: Payer: Self-pay | Admitting: Interventional Cardiology

## 2019-08-06 ENCOUNTER — Ambulatory Visit
Admission: RE | Admit: 2019-08-06 | Discharge: 2019-08-06 | Disposition: A | Discharge status: Home / Self Care | Payer: Medicare Other | Source: Ambulatory Visit | Attending: Family Medicine | Admitting: Family Medicine

## 2019-08-06 ENCOUNTER — Other Ambulatory Visit: Payer: Self-pay

## 2019-08-06 DIAGNOSIS — Z1231 Encounter for screening mammogram for malignant neoplasm of breast: Secondary | ICD-10-CM

## 2019-08-07 ENCOUNTER — Other Ambulatory Visit: Payer: Self-pay | Admitting: Family Medicine

## 2019-08-07 DIAGNOSIS — R928 Other abnormal and inconclusive findings on diagnostic imaging of breast: Secondary | ICD-10-CM

## 2019-08-08 ENCOUNTER — Telehealth: Payer: Self-pay | Admitting: Interventional Cardiology

## 2019-08-08 NOTE — Telephone Encounter (Signed)
Spoke with Dr. [name] and he says pt should get vaccine.  He advised she speak with PCP about allergy concerns.  Spoke with pt and made her aware of recommendations.  Pt verbalized understanding and was appreciative for call.

## 2019-08-08 NOTE — Telephone Encounter (Signed)
New message    Patient wants to know if she should get the covid19 vaccine. In the past she states she has been allergic to flu vaccine.

## 2019-08-16 DIAGNOSIS — N6489 Other specified disorders of breast: Secondary | ICD-10-CM | POA: Diagnosis not present

## 2019-08-20 LAB — LIPID PANEL
Chol/HDL Ratio: 2.4 ratio (ref 0.0–4.4)
Cholesterol, Total: 99 mg/dL — ABNORMAL LOW (ref 100–199)
HDL: 41 mg/dL (ref >39)
LDL Chol Calc (NIH): 34 mg/dL (ref 0–99)
Triglycerides: 137 mg/dL (ref 0–149)
VLDL Cholesterol Cal: 24 mg/dL (ref 5–40)

## 2019-08-27 ENCOUNTER — Encounter (INDEPENDENT_AMBULATORY_CARE_PROVIDER_SITE_OTHER): Payer: Self-pay

## 2019-08-27 ENCOUNTER — Other Ambulatory Visit: Payer: Self-pay

## 2019-08-27 ENCOUNTER — Encounter: Payer: Self-pay | Admitting: Internal Medicine

## 2019-08-27 ENCOUNTER — Telehealth: Payer: Self-pay | Admitting: Internal Medicine

## 2019-08-27 ENCOUNTER — Ambulatory Visit (INDEPENDENT_AMBULATORY_CARE_PROVIDER_SITE_OTHER): Payer: Medicare Other | Admitting: Internal Medicine

## 2019-08-27 VITALS — BP 136/88 | HR 86 | Temp 97.5°F | Ht 61.5 in | Wt 193.0 lb

## 2019-08-27 DIAGNOSIS — I739 Peripheral vascular disease, unspecified: Secondary | ICD-10-CM | POA: Diagnosis not present

## 2019-08-27 DIAGNOSIS — E785 Hyperlipidemia, unspecified: Secondary | ICD-10-CM

## 2019-08-27 DIAGNOSIS — I25810 Atherosclerosis of coronary artery bypass graft(s) without angina pectoris: Secondary | ICD-10-CM

## 2019-08-27 NOTE — Progress Notes (Signed)
LIPID CLINIC CONSULT NOTE  Chief Complaint:  Manage dyslipidemia  Primary Care Physician: Maurice Small, MD  Primary Cardiologist:  [name], MD  HPI:  Carolyn Fleming is a 69 y.o. female who is being seen today for the evaluation of dyslipidemia at the request of Maurice Small, MD. This is a pleasant 69 year old female with history of coronary disease status post CABG x2 emergently with LIMA to LAD and SVG to [place] in 2013.  She also has hypertension, dyslipidemia and obesity.  She previously had had breast cancer of the right breast and underwent chemotherapy when she was in [place].  Her last cardiac catheterization was in September 2016, demonstrating total occlusion of the distal left main and patent SVG and LIMA grafts.  She has struggled with dyslipidemia and relative statin intolerance.  Although she has tried a number of statins before, she seems to be able to tolerate atorvastatin in the past.  Currently she is taking 10 mg 3 times weekly and her most recent lipid profile in October showed total cholesterol 173, triglycerides 113, HDL 38 and LDL 114.  08/27/2019  Carolyn Fleming is seen today in follow-up.  Overall she has been doing very well on Praluent.  She is tolerated the injections and actually says she feels better on the medicine.  She has had marked improvement in her dyslipidemia.  Her total cholesterol now is 99, HDL 41, triglycerides 137 and LDL 34.  She remains on low-dose atorvastatin 10 mg 3 times weekly.  She still has some intermittent discomfort in her legs which may be myalgias related to the statin.  As her LDL is quite below eventually we may be able to discontinue the statin.  I typically do that when the LDLs below 25.  Additionally she might qualify for lower dose Praluent 75 mg every 2 weekly.  She also recently had regulation of her thyroid which was hypothyroid.  This may also help her with weight loss and ultimately improve her lipid profile.    PMHx:    Past Medical History:  Diagnosis Date  . Arthritis   . Asthma   . Breast cancer ([place])    Right breast   . GERD (gastroesophageal reflux disease)   . Hyperlipidemia   . Hypertension 01/25/2012  . Obesity (BMI 35.0-39.9 without comorbidity) 01/25/2012  . S/P CABG x 2 01/24/2012   Emergency LIMA to LAD, SVG to OM1, EVH via right thigh  . Sleep apnea    uses cpap    Past Surgical History:  Procedure Laterality Date  . ABDOMINAL HYSTERECTOMY    . BREAST LUMPECTOMY  1997   breast cancer  . CARDIAC CATHETERIZATION N/A 05/07/2015   Procedure: Left Heart Cath and Cors/Grafts Angiography;  Surgeon: [name], MD;  Location: [facility];  Service: Cardiovascular;  Laterality: N/A;  . CORONARY ARTERY BYPASS GRAFT  01/24/2012   Procedure: CORONARY ARTERY BYPASS GRAFTING (CABG);  Surgeon: [name], MD;  Location: [facility];  Service: Open Heart Surgery;  Laterality: N/A;  Coronary Artery bypass Graft times two utilizing the left internal mammary artery and the right greater saphenous vein harvested endoscopically,  Transesophageal Echocardiogram  . LEFT HEART CATHETERIZATION WITH CORONARY ANGIOGRAM N/A 01/24/2012   Procedure: LEFT HEART CATHETERIZATION WITH CORONARY ANGIOGRAM;  Surgeon: [name], MD;  Location: [facility];  Service: Cardiovascular;  Laterality: N/A;  . TONSILLECTOMY    . TUBAL LIGATION      FAMHx:  Family History  Problem Relation Age of Onset  . Cancer Mother        Colon  . Diabetes Mother   . Hypertension Mother   . Diabetes Sister   . Cancer Brother        Lung  . Diabetes Brother   . Hypertension Brother     SOCHx:   reports that she has never smoked. She has never used smokeless tobacco. She reports that she does not drink alcohol or use drugs.  ALLERGIES:  Allergies  Allergen Reactions  . Codeine Other (See Comments)    Lethargy  . Iodine Other (See Comments)    Per allergy test  . Latex     REACTION: hives  . Statins Other (See  Comments)    Atorvastatin, rosuvastatin  . Sulfonamide Derivatives     REACTION: gi upset    ROS: Pertinent items noted in HPI and remainder of comprehensive ROS otherwise negative.  HOME MEDS: Current Outpatient Medications on File Prior to Visit  Medication Sig Dispense Refill  . Alirocumab (PRALUENT) 150 MG/ML SOAJ Inject 1 Dose into the skin every 14 (fourteen) days. 2 pen 11  . amLODipine (NORVASC) 5 MG tablet TAKE 1 TABLET BY MOUTH EVERY DAY 90 tablet 3  . aspirin (ASPIRIN 81) 81 MG chewable tablet Aspir-81    . atorvastatin (LIPITOR) 10 MG tablet Take 1 tablet by mouth 3 times weekly. 15 tablet 11  . Azelastine HCl 0.15 % SOLN azelastine 0.15 % (205.5 mcg) nasal spray  USE 1 SPRAY INTO EACH NOSTRIL TWICE A DAY    . benzonatate (TESSALON) 200 MG capsule benzonatate 200 mg capsule  1 CAPSULE THREE TIMES A DAY AS NEEDED FOR COUGH FOR 10 DAYS    . Dorzolamide HCl-Timolol Mal PF 2-0.5 % SOLN Apply 2 % to eye 2 (two) times daily at 10 AM and 5 PM.    . fluocinonide ointment (LIDEX) 0.05 % fluocinonide 0.05 % topical ointment  APPLY 1 APPLICATION TO ITCHY AREAS TWICE A DAY    . levothyroxine (SYNTHROID) 50 MCG tablet Take 50 mcg by mouth daily before breakfast.    . loratadine (CLARITIN) 10 MG tablet Take 10 mg by mouth daily.    Marland Kitchen losartan-hydrochlorothiazide (HYZAAR) 50-12.5 MG tablet Take 1 tablet by mouth daily. 90 tablet 3  . meclizine (ANTIVERT) 12.5 MG tablet Take 1 tablet (12.5 mg total) by mouth 3 (three) times daily as needed for dizziness. 90 tablet 0  . NITROSTAT 0.4 MG SL tablet Place 1 tablet (0.4 mg total) under the tongue every 5 (five) minutes as needed for chest pain. 25 tablet 6  . ondansetron (ZOFRAN) 8 MG tablet TAKE 1 TABLET BY MOUTH TWICE A DAY AS NEEDED FOR NAUSEA  1  . Travoprost, BAK Free, (TRAVATAN Z) 0.004 % SOLN ophthalmic solution Travatan Z 0.004 % eye drops  INSTILL 1 DROP INTO BOTH EYES EVERY DAY AT NIGHT    . triamcinolone ointment (KENALOG) 0.1 %  APPLY TO AFFECTED AREA TWICE A DAY  5   No current facility-administered medications on file prior to visit.    LABS/IMAGING: No results found for this or any previous visit (from the past 48 hour(s)). No results found.  LIPID PANEL:    Component Value Date/Time   CHOL 99 (L) 08/20/2019 0809   TRIG 137 08/20/2019 0809   HDL 41 08/20/2019 0809   CHOLHDL 2.4 08/20/2019 0809   CHOLHDL 4.4 12/12/2015 0828   VLDL 29 12/12/2015 [id number]  LDLCALC 34 08/20/2019 0809   LDLDIRECT 149.1 05/28/2013 0946    WEIGHTS: Wt Readings from Last 3 Encounters:  08/27/19 193 lb (87.5 kg)  06/04/19 197 lb 9.6 oz (89.6 kg)  05/29/19 194 lb 6.4 oz (88.2 kg)    VITALS: BP 136/88 (BP Location: Left Arm, Patient Position: Sitting, Cuff Size: Large)   Pulse 86   Temp (!) 97.5 F (36.4 C)   Ht 5' 1.5" (1.562 m)   Wt 193 lb (87.5 kg)   BMI 35.88 kg/m   EXAM: Deferred  EKG: Deferred  ASSESSMENT: 1. Mixed dyslipidemia, goal LDL<70 2. Relative statin intolerance 3. Coronary artery disease status post two-vessel CABG (2013) 4. PAD 5. Hypertension  PLAN: 1.  Ms. Schleisman has had significant improvement in her dyslipidemia on Praluent.  She is tolerating medicine well.  We will continue her low-dose atorvastatin however I would consider discontinuing as she may be still having some myalgias on intermittent dosing.  We will plan a repeat lipid profile in about 6 months.  If there is continued weight loss and improve regulation of her thyroid at that time her numbers may be significantly lower.  Pixie Casino, MD, [facility], [place] Director of the Advanced Lipid Disorders &  Cardiovascular Risk Reduction Clinic Diplomate of the American Board of Clinical Lipidology Attending Cardiologist  Direct Dial: [phone]  Fax: [phone]  Website:  [email or web address] [email or web address] Azaiah Mello 08/27/2019, 9:06 AM

## 2019-08-27 NOTE — Telephone Encounter (Signed)
Printed and mailed

## 2019-08-27 NOTE — Telephone Encounter (Signed)
Patient calling requesting a printed copy of her lab work from 08/20/19 mailed to her.

## 2019-08-27 NOTE — Patient Instructions (Signed)
Medication Instructions:  NO CHANGES *If you need a refill on your cardiac medications before your next appointment, please call your pharmacy*  Lab Work: FASTING LABS IN 6 MONTHS PRIOR TO APT. WITH DR. HILTY. If you have labs (blood work) drawn today and your tests are completely normal, you will receive your results only by: Marland Kitchen MyChart Message (if you have MyChart) OR . A paper copy in the mail If you have any lab test that is abnormal or we need to change your treatment, we will call you to review the results.   Follow-Up: At [facility], you and your health needs are our priority.  As part of our continuing mission to provide you with exceptional heart care, we have created designated Provider Care Teams.  These Care Teams include your primary Cardiologist (physician) and Advanced Practice Providers (APPs -  Physician Assistants and Nurse Practitioners) who all work together to provide you with the care you need, when you need it.  Your next appointment:   6 month(s)  The format for your next appointment:   Either In Person or Virtual  Provider:   Raliegh Ip Mali Hilty, MD

## 2019-09-06 ENCOUNTER — Ambulatory Visit: Payer: Medicare Other | Admitting: Family Medicine

## 2019-09-19 ENCOUNTER — Telehealth: Payer: Self-pay | Admitting: Internal Medicine

## 2019-09-19 NOTE — Telephone Encounter (Signed)
Pt is taking Lipitor 10mg  MWF and complaining of severe body aches/pain. Pt states she could not get out of bed over the weekend due to body aching. Pt feels as if she is a walking "tin man" and she states she can no longer continue taking this medication as it is effecting her quality of life. Pt would like Dr. Debara Pickett to advise and potentially try another med or lessen the dosage. Pt reports having these issues with Lipitor in the past but they have gotten increasingly worse to the point where it is no longer bearable for her.

## 2019-09-19 NOTE — Telephone Encounter (Signed)
New message   Patient states that she is having body aches and pains from taking atorvastatin (LIPITOR) 10 MG tablet. Please call to discuss.

## 2019-09-20 NOTE — Telephone Encounter (Signed)
Patient called with MD advice. She voiced understanding of his recommendation

## 2019-09-20 NOTE — Telephone Encounter (Signed)
Please advise to stop lipitor for at least 2 weeks, if symptoms improve, contact us for further instructions.  Dr Lemmie Evens

## 2019-10-05 ENCOUNTER — Telehealth: Payer: Self-pay | Admitting: Internal Medicine

## 2019-10-05 NOTE — Telephone Encounter (Signed)
New message  Patient is calling in following up about the medication changes. Has been off of atorvastatin for a week now and has had no cramps and no pain and no aching. Please give patient a call back to advise.

## 2019-10-05 NOTE — Telephone Encounter (Signed)
Lipid patient of Dr. Debara Pickett - reports symptoms have improved off statin. Will route to Dr. Debara Pickett to advise

## 2019-10-08 NOTE — Telephone Encounter (Signed)
Patient aware of MD recommendation. Med list updated

## 2019-10-08 NOTE — Telephone Encounter (Signed)
Would advise she remain off statin - she is on Praluent.  Dr Lemmie Evens

## 2019-10-30 ENCOUNTER — Other Ambulatory Visit: Payer: Self-pay

## 2019-10-31 ENCOUNTER — Ambulatory Visit (INDEPENDENT_AMBULATORY_CARE_PROVIDER_SITE_OTHER): Payer: Medicare Other | Admitting: Family Medicine

## 2019-10-31 ENCOUNTER — Encounter: Payer: Self-pay | Admitting: Family Medicine

## 2019-10-31 VITALS — BP 128/70 | HR 84 | Temp 97.9°F | Ht 61.0 in | Wt 189.0 lb

## 2019-10-31 DIAGNOSIS — C50411 Malignant neoplasm of upper-outer quadrant of right female breast: Secondary | ICD-10-CM | POA: Diagnosis not present

## 2019-10-31 DIAGNOSIS — E079 Disorder of thyroid, unspecified: Secondary | ICD-10-CM | POA: Insufficient documentation

## 2019-10-31 DIAGNOSIS — I25810 Atherosclerosis of coronary artery bypass graft(s) without angina pectoris: Secondary | ICD-10-CM | POA: Diagnosis not present

## 2019-10-31 DIAGNOSIS — G4700 Insomnia, unspecified: Secondary | ICD-10-CM

## 2019-10-31 DIAGNOSIS — Z7689 Persons encountering health services in other specified circumstances: Secondary | ICD-10-CM

## 2019-10-31 DIAGNOSIS — G4733 Obstructive sleep apnea (adult) (pediatric): Secondary | ICD-10-CM

## 2019-10-31 DIAGNOSIS — Z9989 Dependence on other enabling machines and devices: Secondary | ICD-10-CM

## 2019-10-31 DIAGNOSIS — I10 Essential (primary) hypertension: Secondary | ICD-10-CM

## 2019-10-31 MED ORDER — HYDROXYZINE HCL 25 MG PO TABS: 25.0000 mg | ORAL_TABLET | Freq: Every evening | ORAL | 0 refills | Status: DC | PRN

## 2019-10-31 NOTE — Progress Notes (Signed)
Patient presents to clinic today to establish care.  SUBJECTIVE: PMH: Pt is a 69 yo female with pmh sig for arthritis, glaucoma, thyroid dysfunction, CVD s/p CABG x 2, HLD, HTN, OSA.  Pt previously seen at [facility] by Dr. Maurice Small.  Pt has numerous specialists.  Pt endorses being sensitive to medications.  CABG x 2, HTN, HLD: -taking Norvasc 5 mg, losartan-HCTZ 50-12.5 mg, praluent q 2 wks -eating healthy, avoids caffiene -Atorvastatin caused myalgias -followed by Cardiology, Dr. Daneen Schick.  Dr. Benay Spice, cholesterol  OSA: -using CPAP nightly -changes tubing and mask q 3 months -followed by Pulm  Glaucoma: -followed by Ophthalmology, Dr. Laurell Josephs -had surgery for elevated occular pressure in L eye Aug 2020 -Taking Dorzolamide HCL-Timolol 2-0.5% ophthalmic soln  Thyroid dysfunction: -notes h/o hyperthyroidism, then hypothyroidism -now on synthroid 50 mcg daily -followed by Endo  H/o R breast cancer: -s/p lumpectomy 1997 -had port a cath placed -stable  Insomnia: -notes difficulty staying asleep -may wake up at 2 am -tried melatonin but did not work -given Ambien while had port a cath placed. Was unable to sleep for 3 days after stopping it.  Allergies:   Latex- edema Codeine-lethargy Atorvastatin-myalgia  Past surgical hx:  R knee arthroscopy 2019 lumpectomy 1997 L eye surgery 2020 Cholecystectomy 30 years ago Tonsillectomy 1972 Hysterectomy 30 years ago  Social hx: Pt is single.  Patient is originally from [place].  Lived in [place] for a while.  She completed some college and is currently retired.  Previously worked at [employer].  Patient has 1 son.  Pt denies EtOH, tobacco use, or drug use.  Family Medical hx: Mom-deceased, alcohol abuse, diabetes, stroke Dad-deceased, black lung 2/2 working in [employer], hearing loss Sister-Helen, lupus, deceased, early death Sister-Judy, alive, diabetes Sister-Renee, alive, glaucoma Brother-Tommy,  deceased Brother-Donald, deceased, alcohol abuse, stroke Brother-Leroy, alive, MI, HTN Son-AAW  Health Maintenance: Dental --Dr. Donn Pierini Vision --Dr. Edilia Bo Dermatology--Dr. Jarome Matin Gastroenterology--Dr. Buccini Immunizations --TB test 2019, shingles vaccine 5 years ago Colonoscopy --2018 Mammogram --2019 PAP -- 2020   Past Medical History:  Diagnosis Date  . Arthritis   . Asthma   . Breast cancer ([place])    Right breast   . GERD (gastroesophageal reflux disease)   . Hyperlipidemia   . Hypertension 01/25/2012  . Obesity (BMI 35.0-39.9 without comorbidity) 01/25/2012  . S/P CABG x 2 01/24/2012   Emergency LIMA to LAD, SVG to OM1, EVH via right thigh  . Sleep apnea    uses cpap    Past Surgical History:  Procedure Laterality Date  . ABDOMINAL HYSTERECTOMY    . BREAST LUMPECTOMY  1997   breast cancer  . CARDIAC CATHETERIZATION N/A 05/07/2015   Procedure: Left Heart Cath and Cors/Grafts Angiography;  Surgeon: [name], MD;  Location: [facility];  Service: Cardiovascular;  Laterality: N/A;  . CORONARY ARTERY BYPASS GRAFT  01/24/2012   Procedure: CORONARY ARTERY BYPASS GRAFTING (CABG);  Surgeon: [name], MD;  Location: [facility];  Service: Open Heart Surgery;  Laterality: N/A;  Coronary Artery bypass Graft times two utilizing the left internal mammary artery and the right greater saphenous vein harvested endoscopically,  Transesophageal Echocardiogram  . LEFT HEART CATHETERIZATION WITH CORONARY ANGIOGRAM N/A 01/24/2012   Procedure: LEFT HEART CATHETERIZATION WITH CORONARY ANGIOGRAM;  Surgeon: [name], MD;  Location: [facility]ATH [facility];  Service: Cardiovascular;  Laterality: N/A;  . TONSILLECTOMY    . TUBAL LIGATION  Current Outpatient Medications on File Prior to Visit  Medication Sig Dispense Refill  . Alirocumab (PRALUENT) 150 MG/ML SOAJ Inject 1 Dose into the skin every 14 (fourteen) days. 2 pen 11  . amLODipine (NORVASC) 5 MG tablet TAKE 1 TABLET BY  MOUTH EVERY DAY 90 tablet 3  . aspirin (ASPIRIN 81) 81 MG chewable tablet Aspir-81    . atorvastatin (LIPITOR) 10 MG tablet Take 10 mg by mouth 3 (three) times a week.    . Azelastine HCl 0.15 % SOLN azelastine 0.15 % (205.5 mcg) nasal spray  USE 1 SPRAY INTO EACH NOSTRIL TWICE A DAY    . benzonatate (TESSALON) 200 MG capsule benzonatate 200 mg capsule  1 CAPSULE THREE TIMES A DAY AS NEEDED FOR COUGH FOR 10 DAYS    . Dorzolamide HCl-Timolol Mal PF 2-0.5 % SOLN Apply 2 % to eye 2 (two) times daily at 10 AM and 5 PM.    . fluocinonide ointment (LIDEX) 0.05 % fluocinonide 0.05 % topical ointment  APPLY 1 APPLICATION TO ITCHY AREAS TWICE A DAY    . levothyroxine (SYNTHROID) 50 MCG tablet Take 50 mcg by mouth daily before breakfast.    . loratadine (CLARITIN) 10 MG tablet Take 10 mg by mouth daily.    Marland Kitchen losartan-hydrochlorothiazide (HYZAAR) 50-12.5 MG tablet Take 1 tablet by mouth daily. 90 tablet 3  . meclizine (ANTIVERT) 12.5 MG tablet Take 1 tablet (12.5 mg total) by mouth 3 (three) times daily as needed for dizziness. 90 tablet 0  . NITROSTAT 0.4 MG SL tablet Place 1 tablet (0.4 mg total) under the tongue every 5 (five) minutes as needed for chest pain. 25 tablet 6  . ondansetron (ZOFRAN) 8 MG tablet TAKE 1 TABLET BY MOUTH TWICE A DAY AS NEEDED FOR NAUSEA  1  . Travoprost, BAK Free, (TRAVATAN Z) 0.004 % SOLN ophthalmic solution Travatan Z 0.004 % eye drops  INSTILL 1 DROP INTO BOTH EYES EVERY DAY AT NIGHT    . triamcinolone ointment (KENALOG) 0.1 % APPLY TO AFFECTED AREA TWICE A DAY  5   No current facility-administered medications on file prior to visit.    Allergies  Allergen Reactions  . Codeine Other (See Comments)    Lethargy  . Iodine Other (See Comments)    Per allergy test  . Latex     REACTION: hives  . Statins Other (See Comments)    Atorvastatin, rosuvastatin  . Sulfonamide Derivatives     REACTION: gi upset    Family History  Problem Relation Age of Onset  . Cancer  Mother        Colon  . Diabetes Mother   . Hypertension Mother   . Diabetes Sister   . Cancer Brother        Lung  . Diabetes Brother   . Hypertension Brother     Social History   Socioeconomic History  . Marital status: Divorced    Spouse name: Not on file  . Number of children: Not on file  . Years of education: Not on file  . Highest education level: Not on file  Occupational History  . Not on file  Tobacco Use  . Smoking status: Never Smoker  . Smokeless tobacco: Never Used  Substance and Sexual Activity  . Alcohol use: No  . Drug use: No  . Sexual activity: Never    Comment: 1st intercourse- 26,  partners- 2   Other Topics Concern  . Not on file  Social History Narrative  .  Not on file   Social Determinants of Health   Financial Resource Strain:   . Difficulty of Paying Living Expenses:   Food Insecurity:   . Worried About Charity fundraiser in the Last Year:   . Arboriculturist in the Last Year:   Transportation Needs:   . Film/video editor (Medical):   Marland Kitchen Lack of Transportation (Non-Medical):   Physical Activity:   . Days of Exercise per Week:   . Minutes of Exercise per Session:   Stress:   . Feeling of Stress :   Social Connections:   . Frequency of Communication with Friends and Family:   . Frequency of Social Gatherings with Friends and Family:   . Attends Religious Services:   . Active Member of Clubs or Organizations:   . Attends Archivist Meetings:   Marland Kitchen Marital Status:   Intimate Partner Violence:   . Fear of Current or Ex-Partner:   . Emotionally Abused:   Marland Kitchen Physically Abused:   . Sexually Abused:     ROS General: Denies fever, chills, night sweats, changes in weight, changes in appetite  +insomnia HEENT: Denies headaches, ear pain, changes in vision, rhinorrhea, sore throat CV: Denies CP, palpitations, SOB, orthopnea Pulm: Denies SOB, cough, wheezing GI: Denies abdominal pain, nausea, vomiting, diarrhea,  constipation GU: Denies dysuria, hematuria, frequency, vaginal discharge Msk: Denies muscle cramps, joint pains Neuro: Denies weakness, numbness, tingling Skin: Denies rashes, bruising Psych: Denies depression, anxiety, hallucinations  BP 128/70 (BP Location: Left Arm, Patient Position: Sitting, Cuff Size: Large)   Pulse 84   Temp 97.9 F (36.6 C) (Temporal)   Ht 5\' 1"  (1.549 m)   Wt 189 lb (85.7 kg)   SpO2 98%   BMI 35.71 kg/m   Physical Exam Gen. Pleasant, well developed, well-nourished, in NAD HEENT - [place]/AT, PERRL, EOMI, no scleral icterus, no nasal drainage, pharynx without erythema or exudate. Neck: No JVD, no thyromegaly, no carotid bruits Lungs: no use of accessory muscles, CTAB, no wheezes, rales or rhonchi Cardiovascular: RRR, No r/g/m, no peripheral edema Abdomen: BS present, soft, nontender,nondistended Musculoskeletal: No deformities, moves all four extremities, no cyanosis or clubbing, normal tone Neuro:  A&Ox3, CN II-XII intact, normal gait Skin:  Warm, dry, intact, no lesions  Recent Results (from the past 2160 hour(s))  Lipid panel     Status: Abnormal   Collection Time: 08/20/19  8:09 AM  Result Value Ref Range   Cholesterol, Total 99 (L) 100 - 199 mg/dL   Triglycerides 137 0 - 149 mg/dL   HDL 41 >39 mg/dL   VLDL Cholesterol Cal 24 5 - 40 mg/dL   LDL Chol Calc (NIH) 34 0 - 99 mg/dL   Chol/HDL Ratio 2.4 0.0 - 4.4 ratio    Comment:                                   T. Chol/HDL Ratio                                             Men  Women                               1/2 Avg.Risk  3.4  3.3                                   Avg.Risk  5.0    4.4                                2X Avg.Risk  9.6    7.1                                3X Avg.Risk 23.4   11.0     Assessment/Plan: CAD of autologous bypass graft -Continue lifestyle modifications -Continue aspirin 81 mg, Norvasc 5 mg, losartan-HCTZ 50-12.5 mg daily, Praluent 150 mg/mL injection every 2  weeks -Continue follow-up with cardiology, Dr. Debara Pickett for cholesterol and Dr. [name]  OSA on CPAP -Stable -Continue CPAP nightly -Continue follow-up with pulmonology  Thyroid dysfunction -Currently hypothyroid -Continue Synthroid 50 mcg daily -Continue follow-up with endocrinology  Malignant neoplasm of upper-outer quadrant of right female breast, unspecified estrogen receptor status ([place]) -In remission -Continue regular breast exams -Continue follow-up with oncology  Insomnia, unspecified type  -Discussed sleep hygiene -Given sensitivity to medications will try hydroxyzine 12.5 mg nightly as needed -Given handout - Plan: hydrOXYzine (ATARAX/VISTARIL) 25 MG tablet  Essential hypertension -Controlled -Continue current medications including losartan-hydrochlorothiazide 50-12.5 mg, Norvasc 5 mg -Continue lifestyle modification.  Encouraged to check BP at home -Continue follow-up with cardiology  Encounter to establish care -We reviewed the PMH, PSH, FH, SH, Meds and Allergies. -We provided refills for any medications we will prescribe as needed. -We addressed current concerns per orders and patient instructions. -We have asked for records for pertinent exams, studies, vaccines and notes from previous providers. -We have advised patient to follow up per instructions below.  F/u as needed in the next month  Grier Mitts, MD

## 2019-10-31 NOTE — Patient Instructions (Signed)
Insomnia Insomnia is a sleep disorder that makes it difficult to fall asleep or stay asleep. Insomnia can cause fatigue, low energy, difficulty concentrating, mood swings, and poor performance at work or school. There are three different ways to classify insomnia:  Difficulty falling asleep.  Difficulty staying asleep.  Waking up too early in the morning. Any type of insomnia can be long-term (chronic) or short-term (acute). Both are common. Short-term insomnia usually lasts for three months or less. Chronic insomnia occurs at least three times a week for longer than three months. What are the causes? Insomnia may be caused by another condition, situation, or substance, such as:  Anxiety.  Certain medicines.  Gastroesophageal reflux disease (GERD) or other gastrointestinal conditions.  Asthma or other breathing conditions.  Restless legs syndrome, sleep apnea, or other sleep disorders.  Chronic pain.  Menopause.  Stroke.  Abuse of alcohol, tobacco, or illegal drugs.  Mental health conditions, such as depression.  Caffeine.  Neurological disorders, such as Alzheimer's disease.  An overactive thyroid (hyperthyroidism). Sometimes, the cause of insomnia may not be known. What increases the risk? Risk factors for insomnia include:  Gender. Women are affected more often than men.  Age. Insomnia is more common as you get older.  Stress.  Lack of exercise.  Irregular work schedule or working night shifts.  Traveling between different time zones.  Certain medical and mental health conditions. What are the signs or symptoms? If you have insomnia, the main symptom is having trouble falling asleep or having trouble staying asleep. This may lead to other symptoms, such as:  Feeling fatigued or having low energy.  Feeling nervous about going to sleep.  Not feeling rested in the morning.  Having trouble concentrating.  Feeling irritable, anxious, or depressed. How  is this diagnosed? This condition may be diagnosed based on:  Your symptoms and medical history. Your health care provider may ask about: ? Your sleep habits. ? Any medical conditions you have. ? Your mental health.  A physical exam. How is this treated? Treatment for insomnia depends on the cause. Treatment may focus on treating an underlying condition that is causing insomnia. Treatment may also include:  Medicines to help you sleep.  Counseling or therapy.  Lifestyle adjustments to help you sleep better. Follow these instructions at home: Eating and drinking   Limit or avoid alcohol, caffeinated beverages, and cigarettes, especially close to bedtime. These can disrupt your sleep.  Do not eat a large meal or eat spicy foods right before bedtime. This can lead to digestive discomfort that can make it hard for you to sleep. Sleep habits   Keep a sleep diary to help you and your health care provider figure out what could be causing your insomnia. Write down: ? When you sleep. ? When you wake up during the night. ? How well you sleep. ? How rested you feel the next day. ? Any side effects of medicines you are taking. ? What you eat and drink.  Make your bedroom a dark, comfortable place where it is easy to fall asleep. ? Put up shades or blackout curtains to block light from outside. ? Use a white noise machine to block noise. ? Keep the temperature cool.  Limit screen use before bedtime. This includes: ? Watching TV. ? Using your smartphone, tablet, or computer.  Stick to a routine that includes going to bed and waking up at the same times every day and night. This can help you fall asleep faster. Consider   making a quiet activity, such as reading, part of your nighttime routine.  Try to avoid taking naps during the day so that you sleep better at night.  Get out of bed if you are still awake after 15 minutes of trying to sleep. Keep the lights down, but try reading or  doing a quiet activity. When you feel sleepy, go back to bed. General instructions  Take over-the-counter and prescription medicines only as told by your health care provider.  Exercise regularly, as told by your health care provider. Avoid exercise starting several hours before bedtime.  Use relaxation techniques to manage stress. Ask your health care provider to suggest some techniques that may work well for you. These may include: ? Breathing exercises. ? Routines to release muscle tension. ? Visualizing peaceful scenes.  Make sure that you drive carefully. Avoid driving if you feel very sleepy.  Keep all follow-up visits as told by your health care provider. This is important. Contact a health care provider if:  You are tired throughout the day.  You have trouble in your daily routine due to sleepiness.  You continue to have sleep problems, or your sleep problems get worse. Get help right away if:  You have serious thoughts about hurting yourself or someone else. If you ever feel like you may hurt yourself or others, or have thoughts about taking your own life, get help right away. You can go to your nearest emergency department or call:  Your local emergency services (911 in the [place]  A suicide crisis helpline, such as the [facility] at 585-585-1264. This is open 24 hours a day. Summary  Insomnia is a sleep disorder that makes it difficult to fall asleep or stay asleep.  Insomnia can be long-term (chronic) or short-term (acute).  Treatment for insomnia depends on the cause. Treatment may focus on treating an underlying condition that is causing insomnia.  Keep a sleep diary to help you and your health care provider figure out what could be causing your insomnia. This information is not intended to replace advice given to you by your health care provider. Make sure you discuss any questions you have with your health care provider. Document  Revised: 07/15/2017 Document Reviewed: 05/12/2017 Elsevier Patient Education  2020 [employer].  Managing Your Hypertension Hypertension is commonly called high blood pressure. This is when the force of your blood pressing against the walls of your arteries is too strong. Arteries are blood vessels that carry blood from your heart throughout your body. Hypertension forces the heart to work harder to pump blood, and may cause the arteries to become narrow or stiff. Having untreated or uncontrolled hypertension can cause heart attack, stroke, kidney disease, and other problems. What are blood pressure readings? A blood pressure reading consists of a higher number over a lower number. Ideally, your blood pressure should be below 120/80. The first ("top") number is called the systolic pressure. It is a measure of the pressure in your arteries as your heart beats. The second ("bottom") number is called the diastolic pressure. It is a measure of the pressure in your arteries as the heart relaxes. What does my blood pressure reading mean? Blood pressure is classified into four stages. Based on your blood pressure reading, your health care provider may use the following stages to determine what type of treatment you need, if any. Systolic pressure and diastolic pressure are measured in a unit called mm Hg. Normal  Systolic pressure: below 123456.  Diastolic pressure: below 80. Elevated  Systolic pressure: [id number].  Diastolic pressure: below 80. Hypertension stage 1  Systolic pressure: 0000000.  Diastolic pressure: [id number]. Hypertension stage 2  Systolic pressure: [id number] or above.  Diastolic pressure: 90 or above. What health risks are associated with hypertension? Managing your hypertension is an important responsibility. Uncontrolled hypertension can lead to:  A heart attack.  A stroke.  A weakened blood vessel (aneurysm).  Heart failure.  Kidney damage.  Eye damage.  Metabolic  syndrome.  Memory and concentration problems. What changes can I make to manage my hypertension? Hypertension can be managed by making lifestyle changes and possibly by taking medicines. Your health care provider will help you make a plan to bring your blood pressure within a normal range. Eating and drinking   Eat a diet that is high in fiber and potassium, and low in salt (sodium), added sugar, and fat. An example eating plan is called the DASH (Dietary Approaches to Stop Hypertension) diet. To eat this way: ? Eat plenty of fresh fruits and vegetables. Try to fill half of your plate at each meal with fruits and vegetables. ? Eat whole grains, such as whole wheat pasta, brown rice, or whole grain bread. Fill about one quarter of your plate with whole grains. ? Eat low-fat diary products. ? Avoid fatty cuts of meat, processed or cured meats, and poultry with skin. Fill about one quarter of your plate with lean proteins such as fish, chicken without skin, beans, eggs, and tofu. ? Avoid premade and processed foods. These tend to be higher in sodium, added sugar, and fat.  Reduce your daily sodium intake. Most people with hypertension should eat less than 1,500 mg of sodium a day.  Limit alcohol intake to no more than 1 drink a day for nonpregnant women and 2 drinks a day for men. One drink equals 12 oz of beer, 5 oz of wine, or 1 oz of hard liquor. Lifestyle  Work with your health care provider to maintain a healthy body weight, or to lose weight. Ask what an ideal weight is for you.  Get at least 30 minutes of exercise that causes your heart to beat faster (aerobic exercise) most days of the week. Activities may include walking, swimming, or biking.  Include exercise to strengthen your muscles (resistance exercise), such as weight lifting, as part of your weekly exercise routine. Try to do these types of exercises for 30 minutes at least 3 days a week.  Do not use any products that contain  nicotine or tobacco, such as cigarettes and e-cigarettes. If you need help quitting, ask your health care provider.  Control any long-term (chronic) conditions you have, such as high cholesterol or diabetes. Monitoring  Monitor your blood pressure at home as told by your health care provider. Your personal target blood pressure may vary depending on your medical conditions, your age, and other factors.  Have your blood pressure checked regularly, as often as told by your health care provider. Working with your health care provider  Review all the medicines you take with your health care provider because there may be side effects or interactions.  Talk with your health care provider about your diet, exercise habits, and other lifestyle factors that may be contributing to hypertension.  Visit your health care provider regularly. Your health care provider can help you create and adjust your plan for managing hypertension. Will I need medicine to control my blood pressure? Your health care provider may prescribe  medicine if lifestyle changes are not enough to get your blood pressure under control, and if:  Your systolic blood pressure is 130 or higher.  Your diastolic blood pressure is 80 or higher. Take medicines only as told by your health care provider. Follow the directions carefully. Blood pressure medicines must be taken as prescribed. The medicine does not work as well when you skip doses. Skipping doses also puts you at risk for problems. Contact a health care provider if:  You think you are having a reaction to medicines you have taken.  You have repeated (recurrent) headaches.  You feel dizzy.  You have swelling in your ankles.  You have trouble with your vision. Get help right away if:  You develop a severe headache or confusion.  You have unusual weakness or numbness, or you feel faint.  You have severe pain in your chest or abdomen.  You vomit repeatedly.  You have  trouble breathing. Summary  Hypertension is when the force of blood pumping through your arteries is too strong. If this condition is not controlled, it may put you at risk for serious complications.  Your personal target blood pressure may vary depending on your medical conditions, your age, and other factors. For most people, a normal blood pressure is less than 120/80.  Hypertension is managed by lifestyle changes, medicines, or both. Lifestyle changes include weight loss, eating a healthy, low-sodium diet, exercising more, and limiting alcohol. This information is not intended to replace advice given to you by your health care provider. Make sure you discuss any questions you have with your health care provider. Document Revised: 11/24/2018 Document Reviewed: 06/30/2016 Elsevier Patient Education  [place].  Hypothyroidism  Hypothyroidism is when the thyroid gland does not make enough of certain hormones (it is underactive). The thyroid gland is a small gland located in the lower front part of the neck, just in front of the windpipe (trachea). This gland makes hormones that help control how the body uses food for energy (metabolism) as well as how the heart and brain function. These hormones also play a role in keeping your bones strong. When the thyroid is underactive, it produces too little of the hormones thyroxine (T4) and triiodothyronine (T3). What are the causes? This condition may be caused by:  Hashimoto's disease. This is a disease in which the body's disease-fighting system (immune system) attacks the thyroid gland. This is the most common cause.  Viral infections.  Pregnancy.  Certain medicines.  Birth defects.  Past radiation treatments to the head or neck for cancer.  Past treatment with radioactive iodine.  Past exposure to radiation in the environment.  Past surgical removal of part or all of the thyroid.  Problems with a gland in the center of the  brain (pituitary gland).  Lack of enough iodine in the diet. What increases the risk? You are more likely to develop this condition if:  You are female.  You have a family history of thyroid conditions.  You use a medicine called lithium.  You take medicines that affect the immune system (immunosuppressants). What are the signs or symptoms? Symptoms of this condition include:  Feeling as though you have no energy (lethargy).  Not being able to tolerate cold.  Weight gain that is not explained by a change in diet or exercise habits.  Lack of appetite.  Dry skin.  Coarse hair.  Menstrual irregularity.  Slowing of thought processes.  Constipation.  Sadness or depression. How is this  diagnosed? This condition may be diagnosed based on:  Your symptoms, your medical history, and a physical exam.  Blood tests. You may also have imaging tests, such as an ultrasound or MRI. How is this treated? This condition is treated with medicine that replaces the thyroid hormones that your body does not make. After you begin treatment, it may take several weeks for symptoms to go away. Follow these instructions at home:  Take over-the-counter and prescription medicines only as told by your health care provider.  If you start taking any new medicines, tell your health care provider.  Keep all follow-up visits as told by your health care provider. This is important. ? As your condition improves, your dosage of thyroid hormone medicine may change. ? You will need to have blood tests regularly so that your health care provider can monitor your condition. Contact a health care provider if:  Your symptoms do not get better with treatment.  You are taking thyroid replacement medicine and you: ? Sweat a lot. ? Have tremors. ? Feel anxious. ? Lose weight rapidly. ? Cannot tolerate heat. ? Have emotional swings. ? Have diarrhea. ? Feel weak. Get help right away if you  have:  Chest pain.  An irregular heartbeat.  A rapid heartbeat.  Difficulty breathing. Summary  Hypothyroidism is when the thyroid gland does not make enough of certain hormones (it is underactive).  When the thyroid is underactive, it produces too little of the hormones thyroxine (T4) and triiodothyronine (T3).  The most common cause is Hashimoto's disease, a disease in which the body's disease-fighting system (immune system) attacks the thyroid gland. The condition can also be caused by viral infections, medicine, pregnancy, or past radiation treatment to the head or neck.  Symptoms may include weight gain, dry skin, constipation, feeling as though you do not have energy, and not being able to tolerate cold.  This condition is treated with medicine to replace the thyroid hormones that your body does not make. This information is not intended to replace advice given to you by your health care provider. Make sure you discuss any questions you have with your health care provider. Document Revised: 07/15/2017 Document Reviewed: 07/13/2017 Elsevier Patient Education  2020 [employer].

## 2019-11-01 ENCOUNTER — Encounter: Payer: Self-pay | Admitting: Family Medicine

## 2019-11-01 NOTE — Addendum Note (Signed)
Addended by: Wyvonne Lenz on: 11/01/2019 02:31 PM   Modules accepted: Orders

## 2019-11-22 ENCOUNTER — Other Ambulatory Visit: Payer: Self-pay | Admitting: Family Medicine

## 2019-11-22 DIAGNOSIS — G4700 Insomnia, unspecified: Secondary | ICD-10-CM

## 2019-11-22 NOTE — Telephone Encounter (Signed)
Last Rx given on 3/17 for #30 with no ref

## 2019-11-26 DIAGNOSIS — H401122 Primary open-angle glaucoma, left eye, moderate stage: Secondary | ICD-10-CM | POA: Diagnosis not present

## 2019-11-26 DIAGNOSIS — H401113 Primary open-angle glaucoma, right eye, severe stage: Secondary | ICD-10-CM | POA: Diagnosis not present

## 2019-12-05 ENCOUNTER — Ambulatory Visit: Payer: Medicare Other | Admitting: Family Medicine

## 2019-12-26 ENCOUNTER — Other Ambulatory Visit: Payer: Self-pay

## 2019-12-27 ENCOUNTER — Other Ambulatory Visit: Payer: Self-pay

## 2019-12-27 ENCOUNTER — Ambulatory Visit (INDEPENDENT_AMBULATORY_CARE_PROVIDER_SITE_OTHER): Payer: Medicare Other | Admitting: Family Medicine

## 2019-12-27 ENCOUNTER — Encounter: Payer: Self-pay | Admitting: Family Medicine

## 2019-12-27 VITALS — BP 110/80 | HR 74 | Temp 97.3°F | Wt 189.0 lb

## 2019-12-27 DIAGNOSIS — I10 Essential (primary) hypertension: Secondary | ICD-10-CM | POA: Diagnosis not present

## 2019-12-27 DIAGNOSIS — I25810 Atherosclerosis of coronary artery bypass graft(s) without angina pectoris: Secondary | ICD-10-CM

## 2019-12-27 DIAGNOSIS — R0982 Postnasal drip: Secondary | ICD-10-CM

## 2019-12-27 DIAGNOSIS — R42 Dizziness and giddiness: Secondary | ICD-10-CM

## 2019-12-27 DIAGNOSIS — J302 Other seasonal allergic rhinitis: Secondary | ICD-10-CM | POA: Diagnosis not present

## 2019-12-27 DIAGNOSIS — J0110 Acute frontal sinusitis, unspecified: Secondary | ICD-10-CM

## 2019-12-27 MED ORDER — AZITHROMYCIN 250 MG PO TABS: ORAL_TABLET | ORAL | 0 refills | Status: DC

## 2019-12-27 MED ORDER — MECLIZINE HCL 12.5 MG PO TABS: 12.5000 mg | ORAL_TABLET | Freq: Three times a day (TID) | ORAL | 0 refills | Status: AC | PRN

## 2019-12-27 NOTE — Progress Notes (Signed)
Subjective:    Patient ID: Carolyn Fleming, female    DOB: 1950/12/20, 69 y.o.   MRN: [id number]  No chief complaint on file.   HPI Patient was seen today for f/u and acute concern.  Pt states she tried 1/2 a 25 mg hydroxyzine tab for insomnia.  Pt felt calm and sleepy but became afraid she might have night terrors and her heart started racing.  Pt states she plans on trying the med again.   Pt notes continued allergy issues-coughing, sneezing.  Pt tried Claritin for symptoms.  Notes facial pressure, post nasal drainage, R ear pressure, rhinorrhea, sore throat at night.  Pt notes increased nasal congestion when using CPAP at night.  Also had increased vertigo symptoms 2 wks ago.  Took expired meclizine which helped.  BP at home controlled.  Taking Norvasc 5 mg, lostartan-HCTZ 50-12.5 mg.     Past Medical History:  Diagnosis Date  . Arthritis   . Asthma   . Breast cancer ([place])    Right breast   . GERD (gastroesophageal reflux disease)   . Glaucoma   . Heart disease   . Hyperlipidemia   . Hypertension 01/25/2012  . Obesity (BMI 35.0-39.9 without comorbidity) 01/25/2012  . S/P CABG x 2 01/24/2012   Emergency LIMA to LAD, SVG to OM1, EVH via right thigh  . Sleep apnea    uses cpap  . Thyroid disease     Allergies  Allergen Reactions  . Codeine Other (See Comments)    Lethargy  . Iodine Other (See Comments)    Per allergy test  . Latex     REACTION: hives  . Statins Other (See Comments)    Atorvastatin, rosuvastatin  . Sulfonamide Derivatives     REACTION: gi upset    ROS General: Denies fever, chills, night sweats, changes in weight, changes in appetite +h/o vertigo HEENT: Denies headaches, ear pain, changes in vision, rhinorrhea, sore throat +facial pressure, R ear pressure, post nasal drainage CV: Denies CP, palpitations, SOB, orthopnea Pulm: Denies SOB, wheezing  +cough GI: Denies abdominal pain, nausea, vomiting, diarrhea, constipation GU: Denies dysuria, hematuria,  frequency, vaginal discharge Msk: Denies muscle cramps, joint pains Neuro: Denies weakness, numbness, tingling Skin: Denies rashes, bruising Psych: Denies depression, anxiety, hallucinations  Objective:    Blood pressure 110/80, pulse 74, temperature (!) 97.3 F (36.3 C), temperature source Temporal, weight 189 lb (85.7 kg), SpO2 98 %.  Gen. Pleasant, well-nourished, in no distress, normal affect   HEENT: [place]/AT, face symmetric, no scleral icterus, PERRLA, EOMI, nares patent erythema noted without drainage, pharynx with postnasal drainage, without erythema or exudate.  TTP of frontal and ethmoid sinuses.   TMs full b/l. Lungs: no accessory muscle use, CTAB, no wheezes or rales Cardiovascular: RRR, no m/r/g, no peripheral edema Neuro:  A&Ox3, CN II-XII intact, normal gait Skin:  Warm, no lesions/ rash   Wt Readings from Last 3 Encounters:  12/27/19 189 lb (85.7 kg)  10/31/19 189 lb (85.7 kg)  08/27/19 193 lb (87.5 kg)    Lab Results  Component Value Date   WBC 4.4 05/02/2015   HGB 14.1 05/02/2015   HCT 42.7 05/02/2015   PLT 219.0 05/02/2015   GLUCOSE 123 (H) 02/22/2018   CHOL 99 (L) 08/20/2019   TRIG 137 08/20/2019   HDL 41 08/20/2019   LDLDIRECT 149.1 05/28/2013   LDLCALC 34 08/20/2019   ALT 21 05/30/2019   AST 18 05/30/2019   NA 140 02/22/2018   K 4.1 02/22/2018  CL 101 02/22/2018   CREATININE 1.28 (H) 02/22/2018   BUN 16 02/22/2018   CO2 23 02/22/2018   TSH 1.353 01/23/2012   INR 1.2 (H) 05/02/2015   HGBA1C 6.4 (H) 01/24/2012    Assessment/Plan:  Acute frontal sinusitis, recurrence not specified  -continue allergy meds - Plan: azithromycin (ZITHROMAX) 250 MG tablet  Essential hypertension -Controlled -Continue current meds including Norvasc 5 mg, losartan-hydrochlorothiazide 50-12.5 mg daily -Continue checking BP at home -Continue lifestyle modifications  Postnasal drip -Continue allergy medication  Seasonal allergies -Continue Claritin as  needed -Declines Flonase 2/2 previous h/o epistaxis with use -Consider using nasal saline rinse daily and taking local honey to help with allergies.  F/u as needed  Grier Mitts, MD

## 2019-12-27 NOTE — Patient Instructions (Addendum)
You can use a teaspoon of local honey to help with your allergies.  You can also use saline nasal daily.  Saline nasal rinse can be found over-the-counter at your local drugstore, [employer], [employer] or online. Sinusitis, Adult Sinusitis is inflammation of your sinuses. Sinuses are hollow spaces in the bones around your face. Your sinuses are located:  Around your eyes.  In the middle of your forehead.  Behind your nose.  In your cheekbones. Mucus normally drains out of your sinuses. When your nasal tissues become inflamed or swollen, mucus can become trapped or blocked. This allows bacteria, viruses, and fungi to grow, which leads to infection. Most infections of the sinuses are caused by a virus. Sinusitis can develop quickly. It can last for up to 4 weeks (acute) or for more than 12 weeks (chronic). Sinusitis often develops after a cold. What are the causes? This condition is caused by anything that creates swelling in the sinuses or stops mucus from draining. This includes:  Allergies.  Asthma.  Infection from bacteria or viruses.  Deformities or blockages in your nose or sinuses.  Abnormal growths in the nose (nasal polyps).  Pollutants, such as chemicals or irritants in the air.  Infection from fungi (rare). What increases the risk? You are more likely to develop this condition if you:  Have a weak body defense system (immune system).  Do a lot of swimming or diving.  Overuse nasal sprays.  Smoke. What are the signs or symptoms? The main symptoms of this condition are pain and a feeling of pressure around the affected sinuses. Other symptoms include:  Stuffy nose or congestion.  Thick drainage from your nose.  Swelling and warmth over the affected sinuses.  Headache.  Upper toothache.  A cough that may get worse at night.  Extra mucus that collects in the throat or the back of the nose (postnasal drip).  Decreased sense of smell and taste.  Fatigue.  A  fever.  Sore throat.  Bad breath. How is this diagnosed? This condition is diagnosed based on:  Your symptoms.  Your medical history.  A physical exam.  Tests to find out if your condition is acute or chronic. This may include: ? Checking your nose for nasal polyps. ? Viewing your sinuses using a device that has a light (endoscope). ? Testing for allergies or bacteria. ? Imaging tests, such as an MRI or CT scan. In rare cases, a bone biopsy may be done to rule out more serious types of fungal sinus disease. How is this treated? Treatment for sinusitis depends on the cause and whether your condition is chronic or acute.  If caused by a virus, your symptoms should go away on their own within 10 days. You may be given medicines to relieve symptoms. They include: ? Medicines that shrink swollen nasal passages (topical intranasal decongestants). ? Medicines that treat allergies (antihistamines). ? A spray that eases inflammation of the nostrils (topical intranasal corticosteroids). ? Rinses that help get rid of thick mucus in your nose (nasal saline washes).  If caused by bacteria, your health care provider may recommend waiting to see if your symptoms improve. Most bacterial infections will get better without antibiotic medicine. You may be given antibiotics if you have: ? A severe infection. ? A weak immune system.  If caused by narrow nasal passages or nasal polyps, you may need to have surgery. Follow these instructions at home: Medicines  Take, use, or apply over-the-counter and prescription medicines only as told  by your health care provider. These may include nasal sprays.  If you were prescribed an antibiotic medicine, take it as told by your health care provider. Do not stop taking the antibiotic even if you start to feel better. Hydrate and humidify   Drink enough fluid to keep your urine pale yellow. Staying hydrated will help to thin your mucus.  Use a cool mist  humidifier to keep the humidity level in your home above 50%.  Inhale steam for 10-15 minutes, 3-4 times a day, or as told by your health care provider. You can do this in the bathroom while a hot shower is running.  Limit your exposure to cool or dry air. Rest  Rest as much as possible.  Sleep with your head raised (elevated).  Make sure you get enough sleep each night. General instructions   Apply a warm, moist washcloth to your face 3-4 times a day or as told by your health care provider. This will help with discomfort.  Wash your hands often with soap and water to reduce your exposure to germs. If soap and water are not available, use hand sanitizer.  Do not smoke. Avoid being around people who are smoking (secondhand smoke).  Keep all follow-up visits as told by your health care provider. This is important. Contact a health care provider if:  You have a fever.  Your symptoms get worse.  Your symptoms do not improve within 10 days. Get help right away if:  You have a severe headache.  You have persistent vomiting.  You have severe pain or swelling around your face or eyes.  You have vision problems.  You develop confusion.  Your neck is stiff.  You have trouble breathing. Summary  Sinusitis is soreness and inflammation of your sinuses. Sinuses are hollow spaces in the bones around your face.  This condition is caused by nasal tissues that become inflamed or swollen. The swelling traps or blocks the flow of mucus. This allows bacteria, viruses, and fungi to grow, which leads to infection.  If you were prescribed an antibiotic medicine, take it as told by your health care provider. Do not stop taking the antibiotic even if you start to feel better.  Keep all follow-up visits as told by your health care provider. This is important. This information is not intended to replace advice given to you by your health care provider. Make sure you discuss any questions you  have with your health care provider. Document Revised: 01/02/2018 Document Reviewed: 01/02/2018 Elsevier Patient Education  [place].  Postnasal Drip Postnasal drip is the feeling of mucus going down the back of your throat. Mucus is a slimy substance that moistens and cleans your nose and throat, as well as the air pockets in face bones near your forehead and cheeks (sinuses). Small amounts of mucus pass from your nose and sinuses down the back of your throat all the time. This is normal. When you produce too much mucus or the mucus gets too thick, you can feel it. Some common causes of postnasal drip include:  Having more mucus because of: ? A cold or the flu. ? Allergies. ? Cold air. ? Certain medicines.  Having more mucus that is thicker because of: ? A sinus or nasal infection. ? Dry air. ? A food allergy. Follow these instructions at home: Relieving discomfort   Gargle with a salt-water mixture 3-4 times a day or as needed. To make a salt-water mixture, completely dissolve -1 tsp  of salt in 1 cup of warm water.  If the air in your home is dry, use a humidifier to add moisture to the air.  Use a saline spray or container (neti pot) to flush out the nose (nasal irrigation). These methods can help clear away mucus and keep the nasal passages moist. General instructions  Take over-the-counter and prescription medicines only as told by your health care provider.  Follow instructions from your health care provider about eating or drinking restrictions. You may need to avoid caffeine.  Avoid things that you know you are allergic to (allergens), like dust, mold, pollen, pets, or certain foods.  Drink enough fluid to keep your urine pale yellow.  Keep all follow-up visits as told by your health care provider. This is important. Contact a health care provider if:  You have a fever.  You have a sore throat.  You have difficulty swallowing.  You have  headache.  You have sinus pain.  You have a cough that does not go away.  The mucus from your nose becomes thick and is green or yellow in color.  You have cold or flu symptoms that last more than 10 days. Summary  Postnasal drip is the feeling of mucus going down the back of your throat.  If your health care provider approves, use nasal irrigation or a nasal spray 2?4 times a day.  Avoid things that you know you are allergic to (allergens), like dust, mold, pollen, pets, or certain foods. This information is not intended to replace advice given to you by your health care provider. Make sure you discuss any questions you have with your health care provider. Document Revised: 11/24/2018 Document Reviewed: 11/15/2016 Elsevier Patient Education  [place] Your Hypertension Hypertension is commonly called high blood pressure. This is when the force of your blood pressing against the walls of your arteries is too strong. Arteries are blood vessels that carry blood from your heart throughout your body. Hypertension forces the heart to work harder to pump blood, and may cause the arteries to become narrow or stiff. Having untreated or uncontrolled hypertension can cause heart attack, stroke, kidney disease, and other problems. What are blood pressure readings? A blood pressure reading consists of a higher number over a lower number. Ideally, your blood pressure should be below 120/80. The first ("top") number is called the systolic pressure. It is a measure of the pressure in your arteries as your heart beats. The second ("bottom") number is called the diastolic pressure. It is a measure of the pressure in your arteries as the heart relaxes. What does my blood pressure reading mean? Blood pressure is classified into four stages. Based on your blood pressure reading, your health care provider may use the following stages to determine what type of treatment you need, if any.  Systolic pressure and diastolic pressure are measured in a unit called mm Hg. Normal  Systolic pressure: below 123456.  Diastolic pressure: below 80. Elevated  Systolic pressure: [id number].  Diastolic pressure: below 80. Hypertension stage 1  Systolic pressure: 0000000.  Diastolic pressure: [id number]. Hypertension stage 2  Systolic pressure: [id number] or above.  Diastolic pressure: 90 or above. What health risks are associated with hypertension? Managing your hypertension is an important responsibility. Uncontrolled hypertension can lead to:  A heart attack.  A stroke.  A weakened blood vessel (aneurysm).  Heart failure.  Kidney damage.  Eye damage.  Metabolic syndrome.  Memory and concentration problems. What changes can  I make to manage my hypertension? Hypertension can be managed by making lifestyle changes and possibly by taking medicines. Your health care provider will help you make a plan to bring your blood pressure within a normal range. Eating and drinking   Eat a diet that is high in fiber and potassium, and low in salt (sodium), added sugar, and fat. An example eating plan is called the DASH (Dietary Approaches to Stop Hypertension) diet. To eat this way: ? Eat plenty of fresh fruits and vegetables. Try to fill half of your plate at each meal with fruits and vegetables. ? Eat whole grains, such as whole wheat pasta, brown rice, or whole grain bread. Fill about one quarter of your plate with whole grains. ? Eat low-fat diary products. ? Avoid fatty cuts of meat, processed or cured meats, and poultry with skin. Fill about one quarter of your plate with lean proteins such as fish, chicken without skin, beans, eggs, and tofu. ? Avoid premade and processed foods. These tend to be higher in sodium, added sugar, and fat.  Reduce your daily sodium intake. Most people with hypertension should eat less than 1,500 mg of sodium a day.  Limit alcohol intake to no more than 1 drink  a day for nonpregnant women and 2 drinks a day for men. One drink equals 12 oz of beer, 5 oz of wine, or 1 oz of hard liquor. Lifestyle  Work with your health care provider to maintain a healthy body weight, or to lose weight. Ask what an ideal weight is for you.  Get at least 30 minutes of exercise that causes your heart to beat faster (aerobic exercise) most days of the week. Activities may include walking, swimming, or biking.  Include exercise to strengthen your muscles (resistance exercise), such as weight lifting, as part of your weekly exercise routine. Try to do these types of exercises for 30 minutes at least 3 days a week.  Do not use any products that contain nicotine or tobacco, such as cigarettes and e-cigarettes. If you need help quitting, ask your health care provider.  Control any long-term (chronic) conditions you have, such as high cholesterol or diabetes. Monitoring  Monitor your blood pressure at home as told by your health care provider. Your personal target blood pressure may vary depending on your medical conditions, your age, and other factors.  Have your blood pressure checked regularly, as often as told by your health care provider. Working with your health care provider  Review all the medicines you take with your health care provider because there may be side effects or interactions.  Talk with your health care provider about your diet, exercise habits, and other lifestyle factors that may be contributing to hypertension.  Visit your health care provider regularly. Your health care provider can help you create and adjust your plan for managing hypertension. Will I need medicine to control my blood pressure? Your health care provider may prescribe medicine if lifestyle changes are not enough to get your blood pressure under control, and if:  Your systolic blood pressure is 130 or higher.  Your diastolic blood pressure is 80 or higher. Take medicines only as  told by your health care provider. Follow the directions carefully. Blood pressure medicines must be taken as prescribed. The medicine does not work as well when you skip doses. Skipping doses also puts you at risk for problems. Contact a health care provider if:  You think you are having a reaction to medicines  you have taken.  You have repeated (recurrent) headaches.  You feel dizzy.  You have swelling in your ankles.  You have trouble with your vision. Get help right away if:  You develop a severe headache or confusion.  You have unusual weakness or numbness, or you feel faint.  You have severe pain in your chest or abdomen.  You vomit repeatedly.  You have trouble breathing. Summary  Hypertension is when the force of blood pumping through your arteries is too strong. If this condition is not controlled, it may put you at risk for serious complications.  Your personal target blood pressure may vary depending on your medical conditions, your age, and other factors. For most people, a normal blood pressure is less than 120/80.  Hypertension is managed by lifestyle changes, medicines, or both. Lifestyle changes include weight loss, eating a healthy, low-sodium diet, exercising more, and limiting alcohol. This information is not intended to replace advice given to you by your health care provider. Make sure you discuss any questions you have with your health care provider. Document Revised: 11/24/2018 Document Reviewed: 06/30/2016 Elsevier Patient Education  [place].

## 2020-01-15 ENCOUNTER — Telehealth: Payer: Self-pay | Admitting: Family Medicine

## 2020-01-15 NOTE — Telephone Encounter (Signed)
Pt stated that it is something personal and did not want to elaborate on it.

## 2020-01-18 NOTE — Telephone Encounter (Signed)
Pt returned your call stated that you all got disconnected and please give her a call back.

## 2020-01-18 NOTE — Telephone Encounter (Signed)
Spoke with pt state that she is requesting Dr Volanda Napoleon if she can accept her partner as a Nurse, learning disability of care, Dr [place]ique is the PCP. Please advise

## 2020-01-18 NOTE — Telephone Encounter (Signed)
Did the pt or her partner leave their name?

## 2020-01-23 NOTE — Telephone Encounter (Signed)
FYI

## 2020-01-23 NOTE — Telephone Encounter (Signed)
Pt name is Carolyn Fleming date of birth is 03/29/1952

## 2020-01-31 ENCOUNTER — Telehealth: Payer: Self-pay | Admitting: Family Medicine

## 2020-01-31 NOTE — Telephone Encounter (Signed)
Spoke with pt scheduled to see Dr Sarajane Jews tomorrow 02/01/2020 for vertigo, Dr Volanda Napoleon is not availabe

## 2020-01-31 NOTE — Telephone Encounter (Signed)
Pts states she was seen recently for vertigo and she is having it real bad right now. She is wondering if her PCP can call something in?  Offered to set up an appointment but pt declined.   Pharmacy: [facility]: [phone]   Pt can be reached at [phone]

## 2020-02-01 ENCOUNTER — Encounter: Payer: Self-pay | Admitting: Family Medicine

## 2020-02-01 ENCOUNTER — Ambulatory Visit (INDEPENDENT_AMBULATORY_CARE_PROVIDER_SITE_OTHER): Payer: Medicare Other | Admitting: Family Medicine

## 2020-02-01 ENCOUNTER — Other Ambulatory Visit: Payer: Self-pay

## 2020-02-01 VITALS — BP 152/80 | HR 81 | Temp 97.7°F | Wt 189.0 lb

## 2020-02-01 DIAGNOSIS — I25810 Atherosclerosis of coronary artery bypass graft(s) without angina pectoris: Secondary | ICD-10-CM

## 2020-02-01 DIAGNOSIS — R42 Dizziness and giddiness: Secondary | ICD-10-CM

## 2020-02-01 MED ORDER — METHYLPREDNISOLONE 4 MG PO TBPK: ORAL_TABLET | ORAL | 0 refills | Status: DC

## 2020-02-01 MED ORDER — DIAZEPAM 5 MG PO TABS: 5.0000 mg | ORAL_TABLET | Freq: Three times a day (TID) | ORAL | 0 refills | Status: AC | PRN

## 2020-02-01 NOTE — Progress Notes (Signed)
   Subjective:    Patient ID: Carolyn Fleming, female    DOB: [birth year], 69 y.o.   MRN: [id number]  HPI Here for one week of dizziness, where the room seems to spin around her if she gets up too quickly or moves her head quickly. She has had vertigo in the past, and Meclizine usually helps. However this time it does not help. She gets nauseated but has not vomited. No headache or other neurologic symptoms.    Review of Systems  Constitutional: Negative.   HENT: Positive for congestion.   Eyes: Negative.   Respiratory: Negative.   Cardiovascular: Negative.   Neurological: Positive for dizziness. Negative for tremors, seizures, syncope, facial asymmetry, speech difficulty, weakness, light-headedness, numbness and headaches.       Objective:   Physical Exam Constitutional:      Comments: She walks carefully and slowly   HENT:     Right Ear: Tympanic membrane, ear canal and external ear normal.     Left Ear: Tympanic membrane, ear canal and external ear normal.     Nose: Nose normal.     Mouth/Throat:     Pharynx: Oropharynx is clear.  Eyes:     Conjunctiva/sclera: Conjunctivae normal.  Cardiovascular:     Rate and Rhythm: Normal rate and regular rhythm.     Pulses: Normal pulses.     Heart sounds: Normal heart sounds.  Pulmonary:     Effort: Pulmonary effort is normal.     Breath sounds: Normal breath sounds.  Lymphadenopathy:     Cervical: No cervical adenopathy.  Neurological:     General: No focal deficit present.     Mental Status: She is alert and oriented to person, place, and time.     Cranial Nerves: No cranial nerve deficit.     Coordination: Coordination normal.     Gait: Gait normal.           Assessment & Plan:  Vertigo, treat with a Medrol dose pack and Valium for a few days. Recheck prn. Alysia Penna, MD

## 2020-02-06 NOTE — Telephone Encounter (Signed)
Ok, if pt's pcp is ok with the change.

## 2020-02-12 NOTE — Telephone Encounter (Signed)
Awaiting for Dr Doug Sou approval

## 2020-02-14 NOTE — Telephone Encounter (Signed)
Left a detail message regarding approval for request on pt transfer, advised to call and schedule pt an appointment

## 2020-02-19 ENCOUNTER — Telehealth: Payer: Self-pay | Admitting: Family Medicine

## 2020-02-19 NOTE — Telephone Encounter (Signed)
Pt call and want a call back on [phone].

## 2020-02-19 NOTE — Telephone Encounter (Signed)
Spoke with pt scheduled appointment for the husband TOC with Dr Volanda Napoleon on 02/22/2020

## 2020-03-11 DIAGNOSIS — H401113 Primary open-angle glaucoma, right eye, severe stage: Secondary | ICD-10-CM | POA: Diagnosis not present

## 2020-03-11 DIAGNOSIS — H401122 Primary open-angle glaucoma, left eye, moderate stage: Secondary | ICD-10-CM | POA: Diagnosis not present

## 2020-03-18 DIAGNOSIS — H401113 Primary open-angle glaucoma, right eye, severe stage: Secondary | ICD-10-CM | POA: Diagnosis not present

## 2020-03-18 DIAGNOSIS — H401122 Primary open-angle glaucoma, left eye, moderate stage: Secondary | ICD-10-CM | POA: Diagnosis not present

## 2020-03-20 ENCOUNTER — Other Ambulatory Visit: Payer: Self-pay | Admitting: Family Medicine

## 2020-03-20 DIAGNOSIS — G4700 Insomnia, unspecified: Secondary | ICD-10-CM

## 2020-03-21 DIAGNOSIS — E039 Hypothyroidism, unspecified: Secondary | ICD-10-CM | POA: Diagnosis not present

## 2020-03-22 LAB — TSH: TSH: 2.91 (ref 0.41–5.90)

## 2020-03-27 DIAGNOSIS — E785 Hyperlipidemia, unspecified: Secondary | ICD-10-CM | POA: Diagnosis not present

## 2020-03-28 ENCOUNTER — Encounter: Payer: Self-pay | Admitting: Family Medicine

## 2020-03-28 ENCOUNTER — Telehealth: Payer: Self-pay | Admitting: Internal Medicine

## 2020-03-28 ENCOUNTER — Other Ambulatory Visit: Payer: Self-pay

## 2020-03-28 ENCOUNTER — Ambulatory Visit (INDEPENDENT_AMBULATORY_CARE_PROVIDER_SITE_OTHER): Payer: Medicare Other | Admitting: Family Medicine

## 2020-03-28 VITALS — BP 128/80 | HR 92 | Temp 98.2°F | Wt 194.0 lb

## 2020-03-28 DIAGNOSIS — K219 Gastro-esophageal reflux disease without esophagitis: Secondary | ICD-10-CM | POA: Diagnosis not present

## 2020-03-28 DIAGNOSIS — I25810 Atherosclerosis of coronary artery bypass graft(s) without angina pectoris: Secondary | ICD-10-CM | POA: Diagnosis not present

## 2020-03-28 DIAGNOSIS — R232 Flushing: Secondary | ICD-10-CM | POA: Diagnosis not present

## 2020-03-28 DIAGNOSIS — Z9049 Acquired absence of other specified parts of digestive tract: Secondary | ICD-10-CM | POA: Insufficient documentation

## 2020-03-28 DIAGNOSIS — E079 Disorder of thyroid, unspecified: Secondary | ICD-10-CM

## 2020-03-28 LAB — LIPID PANEL
Chol/HDL Ratio: 3.4 ratio (ref 0.0–4.4)
Cholesterol, Total: 134 mg/dL (ref 100–199)
HDL: 40 mg/dL (ref >39)
LDL Chol Calc (NIH): 72 mg/dL (ref 0–99)
Triglycerides: 122 mg/dL (ref 0–149)
VLDL Cholesterol Cal: 22 mg/dL (ref 5–40)

## 2020-03-28 MED ORDER — PANTOPRAZOLE SODIUM 40 MG PO TBEC: 40.0000 mg | DELAYED_RELEASE_TABLET | Freq: Every day | ORAL | 3 refills | Status: DC

## 2020-03-28 NOTE — Telephone Encounter (Signed)
On 03/27/2020, patient dropped off thyroid panel results. Per OB-Gyn ran these tests b/c she has symptoms similar to menopause. She said she is swelling up, getting acid reflux, having temperature imbalances. Most recent TSH panel from 03/21/2020 was WNL. She states she is just trying to keep [place] updated on everything. She is seeing her PCP this afternoon. She has lipid clinic f/u on 04/03/20

## 2020-03-28 NOTE — Progress Notes (Signed)
Subjective:    Patient ID: Carolyn Fleming, female    DOB: [birth year], 69 y.o.   MRN: [id number]  No chief complaint on file.   HPI Patient was seen today for ongoing concern.  Pt endorses daily heart burn since taking synthroid 50 mcg.  Endorses burning in esophagus after eating.  Pt will eat ginger mints which help "for a little while".  Pt states she was started on Synthroid in June 2020 by her OB/Gyn, Dr. Darlyn Chamber.  Pt states she had hot flashes, mood swings, and wt.gain.  Pt states the symptoms have not changed since taking the med.  Pt taking Synthroid 50 mcg at 6-7 pm with all of her other meds.  Pt having dinner around 5 pm and goes to bed at 11 pm.  Pt does not eat greasy foods 2/2 h/o cholecystectomy.  Pt has copies of thyroid labs from OB/Gyn from late 2020 and from last wk.    Past Medical History:  Diagnosis Date  . Arthritis   . Asthma   . Breast cancer ([place])    Right breast   . GERD (gastroesophageal reflux disease)   . Glaucoma   . Heart disease   . Hyperlipidemia   . Hypertension 01/25/2012  . Obesity (BMI 35.0-39.9 without comorbidity) 01/25/2012  . S/P CABG x 2 01/24/2012   Emergency LIMA to LAD, SVG to OM1, EVH via right thigh  . Sleep apnea    uses cpap  . Thyroid disease     Allergies  Allergen Reactions  . Codeine Other (See Comments)    Lethargy  . Iodine Other (See Comments)    Per allergy test  . Latex     REACTION: hives  . Statins Other (See Comments)    Atorvastatin, rosuvastatin  . Sulfonamide Derivatives     REACTION: gi upset    ROS General: Denies fever, chills, night sweats, changes in weight, changes in appetite  +weight gain, mood changes, hot flashes HEENT: Denies headaches, ear pain, changes in vision, rhinorrhea, sore throat CV: Denies CP, palpitations, SOB, orthopnea Pulm: Denies SOB, cough, wheezing GI: Denies abdominal pain, nausea, vomiting, diarrhea, constipation  +GERD GU: Denies dysuria, hematuria, frequency, vaginal  discharge Msk: Denies muscle cramps, joint pains Neuro: Denies weakness, numbness, tingling Skin: Denies rashes, bruising Psych: Denies depression, anxiety, hallucinations     Objective:    Blood pressure 128/80, pulse 92, temperature 98.2 F (36.8 C), temperature source Oral, weight 194 lb (88 kg), SpO2 97 %.  Gen. Pleasant, well-nourished, in no distress, normal affect   HEENT: [place]/AT, face symmetric, conjunctiva clear, no scleral icterus, PERRLA, EOMI, nares patent without drainage Lungs: no accessory muscle use Cardiovascular: RRR, no m/r/g, no peripheral edema Neuro:  A&Ox3, CN II-XII intact, normal gait Skin:  Warm, no lesions/ rash   Wt Readings from Last 3 Encounters:  03/28/20 194 lb (88 kg)  02/01/20 189 lb (85.7 kg)  12/27/19 189 lb (85.7 kg)    Lab Results  Component Value Date   WBC 4.4 05/02/2015   HGB 14.1 05/02/2015   HCT 42.7 05/02/2015   PLT 219.0 05/02/2015   GLUCOSE 123 (H) 02/22/2018   CHOL 134 03/27/2020   TRIG 122 03/27/2020   HDL 40 03/27/2020   LDLDIRECT 149.1 05/28/2013   LDLCALC 72 03/27/2020   ALT 21 05/30/2019   AST 18 05/30/2019   NA 140 02/22/2018   K 4.1 02/22/2018   CL 101 02/22/2018   CREATININE 1.28 (H) 02/22/2018  BUN 16 02/22/2018   CO2 23 02/22/2018   TSH 2.91 03/22/2020   INR 1.2 (H) 05/02/2015   HGBA1C 6.4 (H) 01/24/2012    Assessment/Plan:  Thyroid dysfunction  -started on Synthroid 50 mcg by a historic provider. -Labs at time of initial DX not available.  Subsequent labs primarily stable with the exception of labs from November 2020- subclinical hypothyroidism as TSH was 5 and T4 was normal. -Given question of actual thyroid dysfunction and timing when pt takes Synthroid, discussed stopping medication. -We will recheck TSH and free T4 in the next few weeks, sooner if needed -Given handouts - Plan: T4, free, TSH  Gastroesophageal reflux disease, unspecified whether esophagitis present  -Discussed possible causes  including medications. -As stopping Synthroid will see if patient notes improvement in symptoms -Also discussed starting PPI -Given handouts -For worsening or continued symptoms follow-up with GI - Plan: pantoprazole (PROTONIX) 40 MG tablet  Hot flashes -Continue follow-up with OB/GYN  F/u in 4-6 wks, sooner if needed  Grier Mitts, MD

## 2020-03-28 NOTE — Patient Instructions (Signed)
Gastroesophageal Reflux Disease, Adult Gastroesophageal reflux (GER) happens when acid from the stomach flows up into the tube that connects the mouth and the stomach (esophagus). Normally, food travels down the esophagus and stays in the stomach to be digested. However, when a person has GER, food and stomach acid sometimes move back up into the esophagus. If this becomes a more serious problem, the person may be diagnosed with a disease called gastroesophageal reflux disease (GERD). GERD occurs when the reflux:  Happens often.  Causes frequent or severe symptoms.  Causes problems such as damage to the esophagus. When stomach acid comes in contact with the esophagus, the acid may cause soreness (inflammation) in the esophagus. Over time, GERD may create small holes (ulcers) in the lining of the esophagus. What are the causes? This condition is caused by a problem with the muscle between the esophagus and the stomach (lower esophageal sphincter, or LES). Normally, the LES muscle closes after food passes through the esophagus to the stomach. When the LES is weakened or abnormal, it does not close properly, and that allows food and stomach acid to go back up into the esophagus. The LES can be weakened by certain dietary substances, medicines, and medical conditions, including:  Tobacco use.  Pregnancy.  Having a hiatal hernia.  Alcohol use.  Certain foods and beverages, such as coffee, chocolate, onions, and peppermint. What increases the risk? You are more likely to develop this condition if you:  Have an increased body weight.  Have a connective tissue disorder.  Use NSAID medicines. What are the signs or symptoms? Symptoms of this condition include:  Heartburn.  Difficult or painful swallowing.  The feeling of having a lump in the throat.  Abitter taste in the mouth.  Bad breath.  Having a large amount of saliva.  Having an upset or bloated  stomach.  Belching.  Chest pain. Different conditions can cause chest pain. Make sure you see your health care provider if you experience chest pain.  Shortness of breath or wheezing.  Ongoing (chronic) cough or a night-time cough.  Wearing away of tooth enamel.  Weight loss. How is this diagnosed? Your health care provider will take a medical history and perform a physical exam. To determine if you have mild or severe GERD, your health care provider may also monitor how you respond to treatment. You may also have tests, including:  A test to examine your stomach and esophagus with a small camera (endoscopy).  A test thatmeasures the acidity level in your esophagus.  A test thatmeasures how much pressure is on your esophagus.  A barium swallow or modified barium swallow test to show the shape, size, and functioning of your esophagus. How is this treated? The goal of treatment is to help relieve your symptoms and to prevent complications. Treatment for this condition may vary depending on how severe your symptoms are. Your health care provider may recommend:  Changes to your diet.  Medicine.  Surgery. Follow these instructions at home: Eating and drinking   Follow a diet as recommended by your health care provider. This may involve avoiding foods and drinks such as: ? Coffee and tea (with or without caffeine). ? Drinks that containalcohol. ? Energy drinks and sports drinks. ? Carbonated drinks or sodas. ? Chocolate and cocoa. ? Peppermint and mint flavorings. ? Garlic and onions. ? Horseradish. ? Spicy and acidic foods, including peppers, chili powder, curry powder, vinegar, hot sauces, and barbecue sauce. ? Citrus fruit juices and citrus   fruits, such as oranges, lemons, and limes. ? Tomato-based foods, such as red sauce, chili, salsa, and pizza with red sauce. ? Fried and fatty foods, such as donuts, french fries, potato chips, and high-fat dressings. ? High-fat  meats, such as hot dogs and fatty cuts of red and white meats, such as rib eye steak, sausage, ham, and bacon. ? High-fat dairy items, such as whole milk, butter, and cream cheese.  Eat small, frequent meals instead of large meals.  Avoid drinking large amounts of liquid with your meals.  Avoid eating meals during the 2-3 hours before bedtime.  Avoid lying down right after you eat.  Do not exercise right after you eat. Lifestyle   Do not use any products that contain nicotine or tobacco, such as cigarettes, e-cigarettes, and chewing tobacco. If you need help quitting, ask your health care provider.  Try to reduce your stress by using methods such as yoga or meditation. If you need help reducing stress, ask your health care provider.  If you are overweight, reduce your weight to an amount that is healthy for you. Ask your health care provider for guidance about a safe weight loss goal. General instructions  Pay attention to any changes in your symptoms.  Take over-the-counter and prescription medicines only as told by your health care provider. Do not take aspirin, ibuprofen, or other NSAIDs unless your health care provider told you to do so.  Wear loose-fitting clothing. Do not wear anything tight around your waist that causes pressure on your abdomen.  Raise (elevate) the head of your bed about 6 inches (15 cm).  Avoid bending over if this makes your symptoms worse.  Keep all follow-up visits as told by your health care provider. This is important. Contact a health care provider if:  You have: ? New symptoms. ? Unexplained weight loss. ? Difficulty swallowing or it hurts to swallow. ? Wheezing or a persistent cough. ? A hoarse voice.  Your symptoms do not improve with treatment. Get help right away if you:  Have pain in your arms, neck, jaw, teeth, or back.  Feel sweaty, dizzy, or light-headed.  Have chest pain or shortness of breath.  Vomit and your vomit looks  like blood or coffee grounds.  Faint.  Have stool that is bloody or black.  Cannot swallow, drink, or eat. Summary  Gastroesophageal reflux happens when acid from the stomach flows up into the esophagus. GERD is a disease in which the reflux happens often, causes frequent or severe symptoms, or causes problems such as damage to the esophagus.  Treatment for this condition may vary depending on how severe your symptoms are. Your health care provider may recommend diet and lifestyle changes, medicine, or surgery.  Contact a health care provider if you have new or worsening symptoms.  Take over-the-counter and prescription medicines only as told by your health care provider. Do not take aspirin, ibuprofen, or other NSAIDs unless your health care provider told you to do so.  Keep all follow-up visits as told by your health care provider. This is important. This information is not intended to replace advice given to you by your health care provider. Make sure you discuss any questions you have with your health care provider. Document Revised: 02/08/2018 Document Reviewed: 02/08/2018 Elsevier Patient Education  2020 [place] for Gastroesophageal Reflux Disease, Adult When you have gastroesophageal reflux disease (GERD), the foods you eat and your eating habits are very important. Choosing the right foods  can help ease the discomfort of GERD. Consider working with a diet and nutrition specialist (dietitian) to help you make healthy food choices. What general guidelines should I follow?  Eating plan  Choose healthy foods low in fat, such as fruits, vegetables, whole grains, low-fat dairy products, and lean meat, fish, and poultry.  Eat frequent, small meals instead of three large meals each day. Eat your meals slowly, in a relaxed setting. Avoid bending over or lying down until 2-3 hours after eating.  Limit high-fat foods such as fatty meats or fried foods.  Limit your  intake of oils, butter, and shortening to less than 8 teaspoons each day.  Avoid the following: ? Foods that cause symptoms. These may be different for different people. Keep a food diary to keep track of foods that cause symptoms. ? Alcohol. ? Drinking large amounts of liquid with meals. ? Eating meals during the 2-3 hours before bed.  Cook foods using methods other than frying. This may include baking, grilling, or broiling. Lifestyle  Maintain a healthy weight. Ask your health care provider what weight is healthy for you. If you need to lose weight, work with your health care provider to do so safely.  Exercise for at least 30 minutes on 5 or more days each week, or as told by your health care provider.  Avoid wearing clothes that fit tightly around your waist and chest.  Do not use any products that contain nicotine or tobacco, such as cigarettes and e-cigarettes. If you need help quitting, ask your health care provider.  Sleep with the head of your bed raised. Use a wedge under the mattress or blocks under the bed frame to raise the head of the bed. What foods are not recommended? The items listed may not be a complete list. Talk with your dietitian about what dietary choices are best for you. Grains Pastries or quick breads with added fat. [place] toast. Vegetables Deep fried vegetables. [place] fries. Any vegetables prepared with added fat. Any vegetables that cause symptoms. For some people this may include tomatoes and tomato products, chili peppers, onions and garlic, and horseradish. Fruits Any fruits prepared with added fat. Any fruits that cause symptoms. For some people this may include citrus fruits, such as oranges, grapefruit, pineapple, and lemons. Meats and other protein foods High-fat meats, such as fatty beef or pork, hot dogs, ribs, ham, sausage, salami and bacon. Fried meat or protein, including fried fish and fried chicken. Nuts and nut butters. Dairy Whole milk  and chocolate milk. Sour cream. Cream. Ice cream. Cream cheese. Milk shakes. Beverages Coffee and tea, with or without caffeine. Carbonated beverages. Sodas. Energy drinks. Fruit juice made with acidic fruits (such as orange or grapefruit). Tomato juice. Alcoholic drinks. Fats and oils Butter. Margarine. Shortening. Ghee. Sweets and desserts Chocolate and cocoa. Donuts. Seasoning and other foods Pepper. Peppermint and spearmint. Any condiments, herbs, or seasonings that cause symptoms. For some people, this may include curry, hot sauce, or vinegar-based salad dressings. Summary  When you have gastroesophageal reflux disease (GERD), food and lifestyle choices are very important to help ease the discomfort of GERD.  Eat frequent, small meals instead of three large meals each day. Eat your meals slowly, in a relaxed setting. Avoid bending over or lying down until 2-3 hours after eating.  Limit high-fat foods such as fatty meat or fried foods. This information is not intended to replace advice given to you by your health care provider. Make sure you discuss  any questions you have with your health care provider. Document Revised: 11/23/2018 Document Reviewed: 08/03/2016 Elsevier Patient Education  [place].  Hypothyroidism  Hypothyroidism is when the thyroid gland does not make enough of certain hormones (it is underactive). The thyroid gland is a small gland located in the lower front part of the neck, just in front of the windpipe (trachea). This gland makes hormones that help control how the body uses food for energy (metabolism) as well as how the heart and brain function. These hormones also play a role in keeping your bones strong. When the thyroid is underactive, it produces too little of the hormones thyroxine (T4) and triiodothyronine (T3). What are the causes? This condition may be caused by:  Hashimoto's disease. This is a disease in which the body's disease-fighting system  (immune system) attacks the thyroid gland. This is the most common cause.  Viral infections.  Pregnancy.  Certain medicines.  Birth defects.  Past radiation treatments to the head or neck for cancer.  Past treatment with radioactive iodine.  Past exposure to radiation in the environment.  Past surgical removal of part or all of the thyroid.  Problems with a gland in the center of the brain (pituitary gland).  Lack of enough iodine in the diet. What increases the risk? You are more likely to develop this condition if:  You are female.  You have a family history of thyroid conditions.  You use a medicine called lithium.  You take medicines that affect the immune system (immunosuppressants). What are the signs or symptoms? Symptoms of this condition include:  Feeling as though you have no energy (lethargy).  Not being able to tolerate cold.  Weight gain that is not explained by a change in diet or exercise habits.  Lack of appetite.  Dry skin.  Coarse hair.  Menstrual irregularity.  Slowing of thought processes.  Constipation.  Sadness or depression. How is this diagnosed? This condition may be diagnosed based on:  Your symptoms, your medical history, and a physical exam.  Blood tests. You may also have imaging tests, such as an ultrasound or MRI. How is this treated? This condition is treated with medicine that replaces the thyroid hormones that your body does not make. After you begin treatment, it may take several weeks for symptoms to go away. Follow these instructions at home:  Take over-the-counter and prescription medicines only as told by your health care provider.  If you start taking any new medicines, tell your health care provider.  Keep all follow-up visits as told by your health care provider. This is important. ? As your condition improves, your dosage of thyroid hormone medicine may change. ? You will need to have blood tests  regularly so that your health care provider can monitor your condition. Contact a health care provider if:  Your symptoms do not get better with treatment.  You are taking thyroid replacement medicine and you: ? Sweat a lot. ? Have tremors. ? Feel anxious. ? Lose weight rapidly. ? Cannot tolerate heat. ? Have emotional swings. ? Have diarrhea. ? Feel weak. Get help right away if you have:  Chest pain.  An irregular heartbeat.  A rapid heartbeat.  Difficulty breathing. Summary  Hypothyroidism is when the thyroid gland does not make enough of certain hormones (it is underactive).  When the thyroid is underactive, it produces too little of the hormones thyroxine (T4) and triiodothyronine (T3).  The most common cause is Hashimoto's disease, a disease in  which the body's disease-fighting system (immune system) attacks the thyroid gland. The condition can also be caused by viral infections, medicine, pregnancy, or past radiation treatment to the head or neck.  Symptoms may include weight gain, dry skin, constipation, feeling as though you do not have energy, and not being able to tolerate cold.  This condition is treated with medicine to replace the thyroid hormones that your body does not make. This information is not intended to replace advice given to you by your health care provider. Make sure you discuss any questions you have with your health care provider. Document Revised: 07/15/2017 Document Reviewed: 07/13/2017 Elsevier Patient Education  [place]. Levothyroxine tablets What is this medicine? LEVOTHYROXINE (lee voe thye ROX een) is a thyroid hormone. This medicine can improve symptoms of thyroid deficiency such as slow speech, lack of energy, weight gain, hair loss, dry skin, and feeling cold. It also helps to treat goiter (an enlarged thyroid gland). It is also used to treat some kinds of thyroid cancer along with surgery and other medicines. This medicine may  be used for other purposes; ask your health care provider or pharmacist if you have questions. COMMON BRAND NAME(S): Estre, Euthyrox, Levo-T, Levothroid, Levoxyl, Synthroid, Thyro-Tabs, Unithroid What should I tell my health care provider before I take this medicine? They need to know if you have any of these conditions:  Addison's disease or other adrenal gland problem  angina  bone problems  diabetes  dieting or on a weight loss program  fertility problems  heart disease  pituitary gland problem  take medicines that treat or prevent blood clots  an unusual or allergic reaction to levothyroxine, thyroid hormones, other medicines, foods, dyes, or preservatives  pregnant or trying to get pregnant  breast-feeding How should I use this medicine? Take this medicine by mouth with plenty of water. It is best to take on an empty stomach, at least 30 minutes to one hour before breakfast. Avoid taking antacids containing aluminum or magnesium, simethicone, bile acid sequestrants, calcium carbonate, sodium polystyrene sulfonate, ferrous sulfate, sevelamer, lanthanum, or sucralfate within 4 hours of taking this medicine. Follow the directions on the prescription label. Take at the same time each day. Do not take your medicine more often than directed. Contact your pediatrician regarding the use of this medicine in children. While this drug may be prescribed for children and infants as young as a few days of age for selected conditions, precautions do apply. For infants, you may crush the tablet and place in a small amount of (5 to 10 mL or 1 to 2 teaspoonfuls) of water, breast milk, or non-soy based infant formula. Do not mix with soy-based infant formula. Give as directed. Overdosage: If you think you have taken too much of this medicine contact a poison control center or emergency room at once. NOTE: This medicine is only for you. Do not share this medicine with others. What if I miss a  dose? If you miss a dose, take it as soon as you can. If it is almost time for your next dose, take only that dose. Do not take double or extra doses. What may interact with this medicine?  amiodarone  antacids  anti-thyroid medicines  calcium supplements  carbamazepine  certain medicines for depression  certain medicines to treat cancer  cholestyramine  clofibrate  colesevelam  colestipol  digoxin  female hormones, like estrogens and birth control pills, patches, rings, or injections  iron supplements  ketamine  lanthanum  liquid nutrition products like Ensure  lithium  medicines for colds and breathing difficulties  medicines for diabetes  medicines or dietary supplements for weight loss  methadone  niacin  orlistat  oxandrolone  phenobarbital or other barbiturates  phenytoin  rifampin  sevelamer  simethicone  sodium polystyrene sulfonate  soy isoflavones  steroid medicines like prednisone or cortisone  sucralfate  testosterone  theophylline  warfarin This list may not describe all possible interactions. Give your health care provider a list of all the medicines, herbs, non-prescription drugs, or dietary supplements you use. Also tell them if you smoke, drink alcohol, or use illegal drugs. Some items may interact with your medicine. What should I watch for while using this medicine? Be sure to take this medicine with plenty of fluids. Some tablets may cause choking, gagging, or difficulty swallowing from the tablet getting stuck in your throat. Most of these problems disappear if the medicine is taken with the right amount of water or other fluids. Do not switch brands of this medicine unless your health care professional agrees with the change. Ask questions if you are uncertain. You will need regular exams and occasional blood tests to check the response to treatment. If you are receiving this medicine for an underactive thyroid, it  may be several weeks before you notice an improvement. Check with your doctor or health care professional if your symptoms do not improve. It may be necessary for you to take this medicine for the rest of your life. Do not stop using this medicine unless your doctor or health care professional advises you to. This medicine can affect blood sugar levels. If you have diabetes, check your blood sugar as directed. You may lose some of your hair when you first start treatment. With time, this usually corrects itself. If you are going to have surgery, tell your doctor or health care professional that you are taking this medicine. What side effects may I notice from receiving this medicine? Side effects that you should report to your doctor or health care professional as soon as possible:  allergic reactions like skin rash, itching or hives, swelling of the face, lips, or tongue  anxious  breathing problems  changes in menstrual periods  chest pain  diarrhea  excessive sweating or intolerance to heat  fast or irregular heartbeat  leg cramps  nervousness  swelling of ankles, feet, or legs  tremors  trouble sleeping  vomiting Side effects that usually do not require medical attention (report to your doctor or health care professional if they continue or are bothersome):  changes in appetite  headache  irritable  nausea  weight loss This list may not describe all possible side effects. Call your doctor for medical advice about side effects. You may report side effects to FDA at 1-800-FDA-1088. Where should I keep my medicine? Keep out of the reach of children. Store at room temperature between 15 and 30 degrees C (59 and 86 degrees F). Protect from light and moisture. Keep container tightly closed. Throw away any unused medicine after the expiration date. NOTE: This sheet is a summary. It may not cover all possible information. If you have questions about this medicine, talk  to your doctor, pharmacist, or health care provider.  2020 Elsevier/Gold Standard (2019-03-09 15:09:06)

## 2020-04-03 ENCOUNTER — Encounter: Payer: Self-pay | Admitting: Internal Medicine

## 2020-04-03 ENCOUNTER — Ambulatory Visit (INDEPENDENT_AMBULATORY_CARE_PROVIDER_SITE_OTHER): Payer: Medicare Other | Admitting: Internal Medicine

## 2020-04-03 ENCOUNTER — Other Ambulatory Visit: Payer: Self-pay

## 2020-04-03 VITALS — BP 122/84 | HR 76 | Temp 96.8°F | Ht 62.0 in | Wt 193.6 lb

## 2020-04-03 DIAGNOSIS — I10 Essential (primary) hypertension: Secondary | ICD-10-CM

## 2020-04-03 DIAGNOSIS — E785 Hyperlipidemia, unspecified: Secondary | ICD-10-CM | POA: Diagnosis not present

## 2020-04-03 DIAGNOSIS — I739 Peripheral vascular disease, unspecified: Secondary | ICD-10-CM

## 2020-04-03 DIAGNOSIS — I25810 Atherosclerosis of coronary artery bypass graft(s) without angina pectoris: Secondary | ICD-10-CM

## 2020-04-03 NOTE — Patient Instructions (Signed)
Medication Instructions:  Your physician recommends that you continue on your current medications as directed. Please refer to the Current Medication list given to you today.  *If you need a refill on your cardiac medications before your next appointment, please call your pharmacy*   Lab Work: FASTING lipid panel in 1 year to check cholesterol  -- please complete about 1 week before your next visit with Dr. Debara Pickett If you have labs (blood work) drawn today and your tests are completely normal, you will receive your results only by: Marland Kitchen MyChart Message (if you have MyChart) OR . A paper copy in the mail If you have any lab test that is abnormal or we need to change your treatment, we will call you to review the results.   Testing/Procedures: NONE   Follow-Up: At [facility], you and your health needs are our priority.  As part of our continuing mission to provide you with exceptional heart care, we have created designated Provider Care Teams.  These Care Teams include your primary Cardiologist (physician) and Advanced Practice Providers (APPs -  Physician Assistants and Nurse Practitioners) who all work together to provide you with the care you need, when you need it.  We recommend signing up for the patient portal called "MyChart".  Sign up information is provided on this After Visit Summary.  MyChart is used to connect with patients for Virtual Visits (Telemedicine).  Patients are able to view lab/test results, encounter notes, upcoming appointments, etc.  Non-urgent messages can be sent to your provider as well.   To learn more about what you can do with MyChart, go to [email or web address].    Your next appointment:   12 month(s) - lipid clinic  The format for your next appointment:   In Person  Provider:   Raliegh Ip Mali Hilty, MD   Other Instructions  You were approved for a co-pay grant from [facility] in 05/2019. You will need to re-apply this Oct.  2021 [email or web address] >> disease funds >> [email or web address]

## 2020-04-03 NOTE — Progress Notes (Signed)
LIPID CLINIC CONSULT NOTE  Chief Complaint:  Manage dyslipidemia  Primary Care Physician: Billie Ruddy, MD  Primary Cardiologist:  [name], MD  HPI:  Carolyn Fleming is a 69 y.o. female who is being seen today for the evaluation of dyslipidemia at the request of Billie Ruddy, MD. This is a pleasant 69 year old female with history of coronary disease status post CABG x2 emergently with LIMA to LAD and SVG to [place] in 2013.  She also has hypertension, dyslipidemia and obesity.  She previously had had breast cancer of the right breast and underwent chemotherapy when she was in [place].  Her last cardiac catheterization was in September 2016, demonstrating total occlusion of the distal left main and patent SVG and LIMA grafts.  She has struggled with dyslipidemia and relative statin intolerance.  Although she has tried a number of statins before, she seems to be able to tolerate atorvastatin in the past.  Currently she is taking 10 mg 3 times weekly and her most recent lipid profile in October showed total cholesterol 173, triglycerides 113, HDL 38 and LDL 114.  08/27/2019  Carolyn Fleming is seen today in follow-up.  Overall she has been doing very well on Praluent.  She is tolerated the injections and actually says she feels better on the medicine.  She has had marked improvement in her dyslipidemia.  Her total cholesterol now is 99, HDL 41, triglycerides 137 and LDL 34.  She remains on low-dose atorvastatin 10 mg 3 times weekly.  She still has some intermittent discomfort in her legs which may be myalgias related to the statin.  As her LDL is quite below eventually we may be able to discontinue the statin.  I typically do that when the LDLs below 25.  Additionally she might qualify for lower dose Praluent 75 mg every 2 weekly.  She also recently had regulation of her thyroid which was hypothyroid.  This may also help her with weight loss and ultimately improve her lipid  profile.  04/03/20  Carolyn Fleming is seen today in follow-up.  Overall she now says she is feeling the best she has in years.  She was previously on thyroid medicine and has discontinued it because she was having significant fatigue and weakness and the symptoms seem to have resolved.  In addition we had taken off of atorvastatin a while ago as her response to Praluent was excellent with LDL in the 30s.  Subsequently she had a repeat lipid profile as of August 12 which showed total cholesterol 134, triglycerides 122, HDL 40 and LDL 72.  This is very near goal LDL less than 70 indicates very good control of her lipids.  Based on her side effects I would remain off of statins completely at this point.  PMHx:  Past Medical History:  Diagnosis Date  . Arthritis   . Asthma   . Breast cancer ([place])    Right breast   . GERD (gastroesophageal reflux disease)   . Glaucoma   . Heart disease   . Hyperlipidemia   . Hypertension 01/25/2012  . Obesity (BMI 35.0-39.9 without comorbidity) 01/25/2012  . S/P CABG x 2 01/24/2012   Emergency LIMA to LAD, SVG to OM1, EVH via right thigh  . Sleep apnea    uses cpap  . Thyroid disease     Past Surgical History:  Procedure Laterality Date  . ABDOMINAL HYSTERECTOMY    . BREAST LUMPECTOMY  1997   breast cancer  .  CARDIAC CATHETERIZATION N/A 05/07/2015   Procedure: Left Heart Cath and Cors/Grafts Angiography;  Surgeon: [name], MD;  Location: [facility];  Service: Cardiovascular;  Laterality: N/A;  . CHOLECYSTECTOMY    . CORONARY ARTERY BYPASS GRAFT  01/24/2012   Procedure: CORONARY ARTERY BYPASS GRAFTING (CABG);  Surgeon: [name], MD;  Location: [facility];  Service: Open Heart Surgery;  Laterality: N/A;  Coronary Artery bypass Graft times two utilizing the left internal mammary artery and the right greater saphenous vein harvested endoscopically,  Transesophageal Echocardiogram  . LEFT HEART CATHETERIZATION WITH CORONARY ANGIOGRAM N/A 01/24/2012    Procedure: LEFT HEART CATHETERIZATION WITH CORONARY ANGIOGRAM;  Surgeon: [name], MD;  Location: [facility];  Service: Cardiovascular;  Laterality: N/A;  . TONSILLECTOMY    . TUBAL LIGATION      FAMHx:  Family History  Problem Relation Age of Onset  . Cancer Mother        Colon  . Diabetes Mother   . Hypertension Mother   . Diabetes Sister   . Cancer Brother        Lung  . Diabetes Brother   . Hypertension Brother     SOCHx:   reports that she has never smoked. She has never used smokeless tobacco. She reports that she does not drink alcohol and does not use drugs.  ALLERGIES:  Allergies  Allergen Reactions  . Codeine Other (See Comments)    Lethargy  . Iodine Other (See Comments)    Per allergy test  . Latex     REACTION: hives  . Statins Other (See Comments)    Atorvastatin, rosuvastatin  . Sulfonamide Derivatives     REACTION: gi upset    ROS: Pertinent items noted in HPI and remainder of comprehensive ROS otherwise negative.  HOME MEDS: Current Outpatient Medications on File Prior to Visit  Medication Sig Dispense Refill  . Alirocumab (PRALUENT) 150 MG/ML SOAJ Inject 1 Dose into the skin every 14 (fourteen) days. 2 pen 11  . amLODipine (NORVASC) 5 MG tablet TAKE 1 TABLET BY MOUTH EVERY DAY 90 tablet 3  . aspirin (ASPIRIN 81) 81 MG chewable tablet Aspir-81    . atorvastatin (LIPITOR) 10 MG tablet Take 10 mg by mouth 3 (three) times a week.    . Azelastine HCl 0.15 % SOLN azelastine 0.15 % (205.5 mcg) nasal spray  USE 1 SPRAY INTO EACH NOSTRIL TWICE A DAY    . azithromycin (ZITHROMAX) 250 MG tablet Take 2 pills on day 1.  Then take 1 pill daily on days 2 through 5. (Patient taking differently: Take 2 pills on day 1.  Then take 1 pill daily on days 2 through 5.) 6 tablet 0  . diazepam (VALIUM) 5 MG tablet Take 1 tablet (5 mg total) by mouth every 8 (eight) hours as needed (dizziness). 30 tablet 0  . Dorzolamide HCl-Timolol Mal PF 2-0.5 % SOLN Apply 2 %  to eye 2 (two) times daily at 10 AM and 5 PM.    . fluocinonide ointment (LIDEX) 0.05 % fluocinonide 0.05 % topical ointment  APPLY 1 APPLICATION TO ITCHY AREAS TWICE A DAY    . hydrOXYzine (ATARAX/VISTARIL) 25 MG tablet TAKE 1 TABLET (25 MG TOTAL) BY MOUTH AT BEDTIME AS NEEDED (INSOMNIA). 90 tablet 0  . levothyroxine (SYNTHROID) 50 MCG tablet Take 50 mcg by mouth daily before breakfast.    . loratadine (CLARITIN) 10 MG tablet Take 10 mg by mouth daily.    Marland Kitchen  losartan-hydrochlorothiazide (HYZAAR) 50-12.5 MG tablet Take 1 tablet by mouth daily. 90 tablet 3  . meclizine (ANTIVERT) 12.5 MG tablet Take 1 tablet (12.5 mg total) by mouth 3 (three) times daily as needed for dizziness. 90 tablet 0  . methylPREDNISolone (MEDROL DOSEPAK) 4 MG TBPK tablet As directed 21 tablet 0  . NITROSTAT 0.4 MG SL tablet Place 1 tablet (0.4 mg total) under the tongue every 5 (five) minutes as needed for chest pain. 25 tablet 6  . ondansetron (ZOFRAN) 8 MG tablet TAKE 1 TABLET BY MOUTH TWICE A DAY AS NEEDED FOR NAUSEA  1  . pantoprazole (PROTONIX) 40 MG tablet Take 1 tablet (40 mg total) by mouth daily. 30 tablet 3  . Travoprost, BAK Free, (TRAVATAN Z) 0.004 % SOLN ophthalmic solution Travatan Z 0.004 % eye drops  INSTILL 1 DROP INTO BOTH EYES EVERY DAY AT NIGHT    . triamcinolone ointment (KENALOG) 0.1 % APPLY TO AFFECTED AREA TWICE A DAY  5   No current facility-administered medications on file prior to visit.    LABS/IMAGING: No results found for this or any previous visit (from the past 48 hour(s)). No results found.  LIPID PANEL:    Component Value Date/Time   CHOL 134 03/27/2020 1222   TRIG 122 03/27/2020 1222   HDL 40 03/27/2020 1222   CHOLHDL 3.4 03/27/2020 1222   CHOLHDL 4.4 12/12/2015 0828   VLDL 29 12/12/2015 0828   LDLCALC 72 03/27/2020 1222   LDLDIRECT 149.1 05/28/2013 0946    WEIGHTS: Wt Readings from Last 3 Encounters:  03/28/20 194 lb (88 kg)  02/01/20 189 lb (85.7 kg)  12/27/19 189 lb  (85.7 kg)    VITALS: There were no vitals taken for this visit.  EXAM: Deferred  EKG: Deferred  ASSESSMENT: 1. Mixed dyslipidemia, goal LDL<70 2. Statin intolerance 3. Coronary artery disease status post two-vessel CABG (2013) 4. PAD 5. Hypertension 6. ?  Hypothyroidism  PLAN: 1.  Ms. Lover had significant improvement in her lipids on Praluent and unfortunately had statin intolerance which had completely resolved after discontinuing her atorvastatin.  Her LDL has come up accordingly however the 72 which indicates still very good control.  Continue her current therapy on Praluent and plan follow-up annually or sooner as necessary.  Pixie Casino, MD, [facility], [place] Director of the Advanced Lipid Disorders &  Cardiovascular Risk Reduction Clinic Diplomate of the American Board of Clinical Lipidology Attending Cardiologist  Direct Dial: [phone]  Fax: [phone]  Website:  [email or web address] 04/03/2020, 8:04 AM

## 2020-04-16 ENCOUNTER — Telehealth: Payer: Self-pay | Admitting: Family Medicine

## 2020-04-16 NOTE — Telephone Encounter (Signed)
Attempted to schedule AWV. Unable to LVM.  Will try at later time.  

## 2020-04-16 NOTE — Telephone Encounter (Signed)
Left message for patient to schedule Annual Wellness Visit.  Please schedule with Nurse Health Advisor Shannon Crews, RN at [facility]  

## 2020-04-19 ENCOUNTER — Other Ambulatory Visit: Payer: Self-pay | Admitting: Internal Medicine

## 2020-04-22 ENCOUNTER — Other Ambulatory Visit: Payer: Self-pay

## 2020-04-22 DIAGNOSIS — N644 Mastodynia: Secondary | ICD-10-CM | POA: Diagnosis not present

## 2020-04-23 ENCOUNTER — Ambulatory Visit: Payer: Medicare Other | Admitting: Family Medicine

## 2020-04-23 ENCOUNTER — Telehealth: Payer: Self-pay | Admitting: Family Medicine

## 2020-04-23 NOTE — Telephone Encounter (Signed)
Pt was returning a call back. Ask that she gets a call back  Please advise

## 2020-04-24 NOTE — Telephone Encounter (Signed)
Rx(s) sent to pharmacy electronically.  

## 2020-04-25 NOTE — Telephone Encounter (Signed)
Returned pt call not able to leave a message, will try call back later

## 2020-05-02 ENCOUNTER — Telehealth: Payer: Self-pay | Admitting: Internal Medicine

## 2020-05-02 NOTE — Telephone Encounter (Signed)
Left a detailed message for pt to call the office regarding diabetic supplies that he needs sent to pharmacy

## 2020-05-02 NOTE — Telephone Encounter (Signed)
Returned call to patient, she states she tried to renew the [employer] for [employer] and she was told they no longer provide assistance for this medication.  Advised primary just made aware of this change, out of office today but would make her aware and she could call to discuss alternative/other options?    Patient aware and verbalized understanding.

## 2020-05-02 NOTE — Telephone Encounter (Signed)
New message   Pt c/o medication issue:  1. Name of Medication: PRALUENT 150 MG/ML SOAJ   2. How are you currently taking this medication (dosage and times per day)? INJECT 1 DOSE INTO THE SKIN EVERY 14 (FOURTEEN) DAYS  3. Are you having a reaction (difficulty breathing--STAT)? No   4. What is your medication issue? Pt called in and stated that she is on the health wellness program for this med and they stated that they do not have any grant left.  She stated that this med is working perfectly and would like to know what she could do?   Best number -[phone]

## 2020-05-05 NOTE — Telephone Encounter (Signed)
Called patient and discussed that [facility] just recently closed for hypercholesterolemia. Will leave praluent patient assistance application for patient to pick up and return to be submitted

## 2020-05-07 ENCOUNTER — Telehealth: Payer: Self-pay | Admitting: Internal Medicine

## 2020-05-07 NOTE — Telephone Encounter (Signed)
Pt c/o medication issue:  1. Name of Medication: PRALUENT 150 MG/ML SOAJ  2. How are you currently taking this medication (dosage and times per day)?    3. Are you having a reaction (difficulty breathing--STAT)? no  4. What is your medication issue? Pt went to go pick up her medication but did not have enough money to cover the cost of the medication. The program that she was in covered the cost of the medication.   She wanted to speak with Eliezer Lofts and find out what she needs to do while she waits for the other patient assistance program gets approved.

## 2020-05-08 NOTE — Telephone Encounter (Signed)
Sample of Praluent 150mg /mL left for patient to pick up

## 2020-05-09 NOTE — Telephone Encounter (Signed)
Spoke with pt state that his diabetic supplies have all been refilled, nothing further needed

## 2020-05-12 ENCOUNTER — Other Ambulatory Visit: Payer: Medicare Other

## 2020-05-12 ENCOUNTER — Other Ambulatory Visit: Payer: Self-pay

## 2020-05-12 ENCOUNTER — Ambulatory Visit: Payer: Medicare Other | Admitting: Family Medicine

## 2020-05-12 DIAGNOSIS — E079 Disorder of thyroid, unspecified: Secondary | ICD-10-CM

## 2020-05-12 LAB — TSH: TSH: 6.42 m[IU]/L — ABNORMAL HIGH (ref 0.40–4.50)

## 2020-05-12 LAB — T4, FREE: Free T4: 0.8 ng/dL (ref 0.8–1.8)

## 2020-05-12 NOTE — Telephone Encounter (Signed)
Faxed patient assistance application to [phone]

## 2020-05-13 ENCOUNTER — Telehealth: Payer: Self-pay | Admitting: Family Medicine

## 2020-05-13 DIAGNOSIS — M5136 Other intervertebral disc degeneration, lumbar region: Secondary | ICD-10-CM | POA: Diagnosis not present

## 2020-05-13 DIAGNOSIS — M545 Low back pain, unspecified: Secondary | ICD-10-CM | POA: Insufficient documentation

## 2020-05-13 DIAGNOSIS — M479 Spondylosis, unspecified: Secondary | ICD-10-CM | POA: Diagnosis not present

## 2020-05-13 NOTE — Telephone Encounter (Signed)
pt would like nancy to give her a call forgot to mention something to her when she spoke with her  [phone]

## 2020-05-14 NOTE — Telephone Encounter (Signed)
Pt came by the office this morning reported that she was seen by  Orthopedic for her back pain, pt state that she was diagnosed with Arthiritis and was prescribed prednisone and a muscle relaxer

## 2020-05-15 NOTE — Telephone Encounter (Signed)
Patient has been denied Praluent patient assistance d/t not meeting income criteria Phone # for MyPraluent [phone]

## 2020-05-15 NOTE — Telephone Encounter (Signed)
Possibly consider [place]-4 - exclusion would be PCSK9 use within the past 3 months. I'll include Doroteo Bradford on this to see if she may qualify.  Dr Lemmie Evens

## 2020-05-16 ENCOUNTER — Telehealth: Payer: Self-pay | Admitting: Family Medicine

## 2020-05-16 DIAGNOSIS — E079 Disorder of thyroid, unspecified: Secondary | ICD-10-CM

## 2020-05-16 NOTE — Telephone Encounter (Signed)
-----   Message from Billie Ruddy, MD sent at 05/13/2020 10:02 AM EDT ----- Subclinical hypothyroidism noted (TSH elevated, but free T4 normal).  Continue to hold synthroid and recheck labs in 2 months.  If pt becomes symptomatic or labs change will restart med.

## 2020-05-16 NOTE — Telephone Encounter (Signed)
LMOVM for pt to continue to hold the medication discussed and to call and schedule lab visit for 2 months so we can take good care of her/and if she starts having symptoms then we need to know/created future order for TSH & FT4 per PCP/thx dmf

## 2020-05-19 ENCOUNTER — Encounter: Payer: Self-pay | Admitting: Internal Medicine

## 2020-05-19 NOTE — Telephone Encounter (Signed)
I believe Carolyn Fleming was going to review and get back to her today or tomorrow about the trial- she has been out of the office.  Dr Lemmie Evens

## 2020-05-19 NOTE — Telephone Encounter (Signed)
Spoke with patient who reports she contact MyPraluent program about not qualifying for assistance.   I provided her with info on how to look up social security administration low income subsidy called extra help  Will send message to Dr. Debara Pickett for update on clinical trial

## 2020-05-19 NOTE — Telephone Encounter (Signed)
Patient calling back to speak with Carolyn Fleming about her praluent paperwork.

## 2020-05-19 NOTE — Telephone Encounter (Signed)
error 

## 2020-05-25 NOTE — Progress Notes (Signed)
Cardiology Office Note:    Date:  05/30/2020   ID:  Carolyn Fleming, DOB 13-Oct-1950, MRN [id number]  PCP:  Billie Ruddy, MD  Cardiologist:  [name], MD   Referring MD: Billie Ruddy, MD   Chief Complaint  Patient presents with  . Coronary Artery Disease  . Hypertension  . Hyperlipidemia    History of Present Illness:    Carolyn Fleming is a 69 y.o. female with a hx of CAD, prior anterior non-ST elevation MI, coronary bypass graftingwith LIMA to LAD, SVG to OM 2013, hypertension, abnormal right LE doppler waveforms,hyperlipidemia intolerant of statins now on PCSK-9 (referred to [facility], MD 05/2019), and and BMI 35.  She is not having any cardiac complaints.  She specifically denies angina and shortness of breath.  She is exercising regularly.  She denies orthopnea, PND, lower extremity swelling.  She has not had chest pain that requires nitroglycerin.  She is compliant with her medication regimen.  She has gotten excellent results from [place] but now the [facility] is no longer covering PCSK9 therapy.  She is in the lipid clinic with Dr. Debara Pickett.  She continues to need aggressive risk management as she is high risk.    Past Medical History:  Diagnosis Date  . Arthritis   . Asthma   . Breast cancer ([place])    Right breast   . GERD (gastroesophageal reflux disease)   . Glaucoma   . Heart disease   . Hyperlipidemia   . Hypertension 01/25/2012  . Obesity (BMI 35.0-39.9 without comorbidity) 01/25/2012  . S/P CABG x 2 01/24/2012   Emergency LIMA to LAD, SVG to OM1, EVH via right thigh  . Sleep apnea    uses cpap  . Thyroid disease     Past Surgical History:  Procedure Laterality Date  . ABDOMINAL HYSTERECTOMY    . BREAST LUMPECTOMY  1997   breast cancer  . CARDIAC CATHETERIZATION N/A 05/07/2015   Procedure: Left Heart Cath and Cors/Grafts Angiography;  Surgeon: [name], MD;  Location: [facility] CV LAB;  Service: Cardiovascular;  Laterality: N/A;    . CHOLECYSTECTOMY    . CORONARY ARTERY BYPASS GRAFT  01/24/2012   Procedure: CORONARY ARTERY BYPASS GRAFTING (CABG);  Surgeon: [name], MD;  Location: [facility];  Service: Open Heart Surgery;  Laterality: N/A;  Coronary Artery bypass Graft times two utilizing the left internal mammary artery and the right greater saphenous vein harvested endoscopically,  Transesophageal Echocardiogram  . LEFT HEART CATHETERIZATION WITH CORONARY ANGIOGRAM N/A 01/24/2012   Procedure: LEFT HEART CATHETERIZATION WITH CORONARY ANGIOGRAM;  Surgeon: [name], MD;  Location: [facility] CATH LAB;  Service: Cardiovascular;  Laterality: N/A;  . TONSILLECTOMY    . TUBAL LIGATION      Current Medications: Current Meds  Medication Sig  . amLODipine (NORVASC) 5 MG tablet TAKE 1 TABLET BY MOUTH EVERY DAY  . aspirin (ASPIRIN 81) 81 MG chewable tablet Aspir-81  . Azelastine HCl 0.15 % SOLN azelastine 0.15 % (205.5 mcg) nasal spray  USE 1 SPRAY INTO EACH NOSTRIL TWICE A DAY  . diazepam (VALIUM) 5 MG tablet Take 1 tablet (5 mg total) by mouth every 8 (eight) hours as needed (dizziness).  . Dorzolamide HCl-Timolol Mal PF 2-0.5 % SOLN Place 2 % into the left eye in the morning and at bedtime.   . hydrocortisone cream 1 % Apply 1 application topically as needed for itching.  . hydrOXYzine (ATARAX/VISTARIL) 25 MG  tablet TAKE 1 TABLET (25 MG TOTAL) BY MOUTH AT BEDTIME AS NEEDED (INSOMNIA).  Marland Kitchen ketoconazole (NIZORAL) 2 % cream Apply 1 application topically as needed for irritation.  Marland Kitchen loratadine (CLARITIN) 10 MG tablet Take 10 mg by mouth daily.  Marland Kitchen losartan-hydrochlorothiazide (HYZAAR) 50-12.5 MG tablet Take 1 tablet by mouth daily.  . meclizine (ANTIVERT) 12.5 MG tablet Take 1 tablet (12.5 mg total) by mouth 3 (three) times daily as needed for dizziness.  Marland Kitchen NITROSTAT 0.4 MG SL tablet Place 1 tablet (0.4 mg total) under the tongue every 5 (five) minutes as needed for chest pain.  Marland Kitchen ondansetron (ZOFRAN) 8 MG tablet TAKE 1 TABLET  BY MOUTH TWICE A DAY AS NEEDED FOR NAUSEA  . pantoprazole (PROTONIX) 40 MG tablet Take 1 tablet (40 mg total) by mouth daily.  Marland Kitchen PRALUENT 150 MG/ML SOAJ INJECT 1 DOSE INTO THE SKIN EVERY 14 (FOURTEEN) DAYS.  . Travoprost, BAK Free, (TRAVATAN Z) 0.004 % SOLN ophthalmic solution Place 1 drop into the left eye.      Allergies:   Codeine, Iodine, Latex, Statins, and Sulfonamide derivatives   Social History   Socioeconomic History  . Marital status: Single    Spouse name: Not on file  . Number of children: Not on file  . Years of education: Not on file  . Highest education level: Not on file  Occupational History  . Not on file  Tobacco Use  . Smoking status: Never Smoker  . Smokeless tobacco: Never Used  Vaping Use  . Vaping Use: Never used  Substance and Sexual Activity  . Alcohol use: No  . Drug use: No  . Sexual activity: Never    Comment: 1st intercourse- 26,  partners- 2   Other Topics Concern  . Not on file  Social History Narrative  . Not on file   Social Determinants of Health   Financial Resource Strain:   . Difficulty of Paying Living Expenses: Not on file  Food Insecurity:   . Worried About Charity fundraiser in the Last Year: Not on file  . Ran Out of Food in the Last Year: Not on file  Transportation Needs:   . Lack of Transportation (Medical): Not on file  . Lack of Transportation (Non-Medical): Not on file  Physical Activity:   . Days of Exercise per Week: Not on file  . Minutes of Exercise per Session: Not on file  Stress:   . Feeling of Stress : Not on file  Social Connections:   . Frequency of Communication with Friends and Family: Not on file  . Frequency of Social Gatherings with Friends and Family: Not on file  . Attends Religious Services: Not on file  . Active Member of Clubs or Organizations: Not on file  . Attends Archivist Meetings: Not on file  . Marital Status: Not on file     Family History: The patient's family history  includes Cancer in her brother and mother; Diabetes in her brother, mother, and sister; Hypertension in her brother and mother.  ROS:   Please see the history of present illness.    Diagnosed with hypothyroidism, was started on Synthroid, and started feeling poorly.  She was started on this medication by Dr. Ophelia Charter.  It is now being managed by Dr. Grier Mitts.  She had right eye surgery due to severe glaucoma by Dr. Edilia Bo at [facility].  All other systems reviewed and are negative.  EKGs/Labs/Other Studies Reviewed:    The following studies were reviewed today: No new or recent imaging  EKG:  EKG normal sinus rhythm, nonspecific T wave flattening, poor R wave progression V1 through V3, short PR interval (borderline).  When compared to the prior tracings  Recent Labs: 05/12/2020: TSH 6.42  Recent Lipid Panel    Component Value Date/Time   CHOL 134 03/27/2020 1222   TRIG 122 03/27/2020 1222   HDL 40 03/27/2020 1222   CHOLHDL 3.4 03/27/2020 1222   CHOLHDL 4.4 12/12/2015 0828   VLDL 29 12/12/2015 0828   LDLCALC 72 03/27/2020 1222   LDLDIRECT 149.1 05/28/2013 0946    Physical Exam:    VS:  BP 136/76   Pulse 81   Ht 5\' 2"  (1.575 m)   Wt 192 lb 3.2 oz (87.2 kg)   SpO2 98%   BMI 35.15 kg/m     Wt Readings from Last 3 Encounters:  05/30/20 192 lb 3.2 oz (87.2 kg)  04/03/20 193 lb 9.6 oz (87.8 kg)  03/28/20 194 lb (88 kg)     GEN: Moderate obesity.  BMI 35. No acute distress HEENT: Normal NECK: No JVD. LYMPHATICS: No lymphadenopathy CARDIAC:  RRR without murmur, gallop, or edema. VASCULAR:  Normal Pulses. No bruits. RESPIRATORY:  Clear to auscultation without rales, wheezing or rhonchi  ABDOMEN: Soft, non-tender, non-distended, No pulsatile mass, MUSCULOSKELETAL: No deformity  SKIN: Warm and dry NEUROLOGIC:  Alert and oriented x 3 PSYCHIATRIC:  Normal affect   ASSESSMENT:    1. CAD of autologous bypass graft   2.  PAD (peripheral artery disease) ([place])   3. Hyperlipidemia, unspecified hyperlipidemia type   4. Essential hypertension   5. Educated about COVID-19 virus infection   6. OSA on CPAP   7. Obesity (BMI 35.0-39.9 without comorbidity)    PLAN:    In order of problems listed above:  1. Aggressive secondary prevention is being achieved.  There is an affordability issue with her lipid management.  She is statin intolerant.  She is on PCSK9 therapy but [facility] has stopped covering.  Continue therapy as recommended by the lipid clinic.  Engage in physical activity greater than 150 minutes/week, tight blood pressure control with 130/80 mmHg or less as the target. 2. Walking greater than 150 minutes/week is encouraged to stimulate collateral growth 3. Try to keep the patient on PCSK9 therapy 4. Less than 3000 mg sodium daily, continue Norvasc 5 mg/day, continue Hyzaar 50/12.5 mg/day, and monitor at home.  Target is less than 130/80 mmHg. 5. She has gotten the booster of mRNA.  She got pretty sick after the dose.  She is rebounding now. 6. Encourage compliance with CPAP. 7. She needs to try to get BMI to 30 or less.  Decrease caloric intake and increase physical activity all encouraged.   Overall education and awareness concerning primary/secondary risk prevention was discussed in detail: LDL less than 70, hemoglobin A1c less than 7, blood pressure target less than 130/80 mmHg, >150 minutes of moderate aerobic activity per week, avoidance of smoking, weight control (via diet and exercise), and continued surveillance/management of/for obstructive sleep apnea.    Medication Adjustments/Labs and Tests Ordered: Current medicines are reviewed at length with the patient today.  Concerns regarding medicines are outlined above.  Orders Placed This Encounter  Procedures  . EKG 12-Lead   No orders of the defined types were placed in this encounter.   Patient Instructions  Medication Instructions:    Your physician recommends that you  continue on your current medications as directed. Please refer to the Current Medication list given to you today.  *If you need a refill on your cardiac medications before your next appointment, please call your pharmacy*   Lab Work: None If you have labs (blood work) drawn today and your tests are completely normal, you will receive your results only by: Marland Kitchen MyChart Message (if you have MyChart) OR . A paper copy in the mail If you have any lab test that is abnormal or we need to change your treatment, we will call you to review the results.   Testing/Procedures: None   Follow-Up: At [facility], you and your health needs are our priority.  As part of our continuing mission to provide you with exceptional heart care, we have created designated Provider Care Teams.  These Care Teams include your primary Cardiologist (physician) and Advanced Practice Providers (APPs -  Physician Assistants and Nurse Practitioners) who all work together to provide you with the care you need, when you need it.  We recommend signing up for the patient portal called "MyChart".  Sign up information is provided on this After Visit Summary.  MyChart is used to connect with patients for Virtual Visits (Telemedicine).  Patients are able to view lab/test results, encounter notes, upcoming appointments, etc.  Non-urgent messages can be sent to your provider as well.   To learn more about what you can do with MyChart, go to [email or web address].    Your next appointment:   12 month(s)  The format for your next appointment:   In Person  Provider:   You may see [name], MD or one of the following Advanced Practice Providers on your designated Care Team:    Truitt Merle, NP  Cecilie Kicks, NP  Kathyrn Drown, NP    Other Instructions   Low-Sodium Eating Plan Sodium, which is an element that makes up salt, helps you maintain a healthy balance of fluids in  your body. Too much sodium can increase your blood pressure and cause fluid and waste to be held in your body. Your health care provider or dietitian may recommend following this plan if you have high blood pressure (hypertension), kidney disease, liver disease, or heart failure. Eating less sodium can help lower your blood pressure, reduce swelling, and protect your heart, liver, and kidneys. What are tips for following this plan? General guidelines  Most people on this plan should limit their sodium intake to 1,500-2,000 mg (milligrams) of sodium each day. Reading food labels   The Nutrition Facts label lists the amount of sodium in one serving of the food. If you eat more than one serving, you must multiply the listed amount of sodium by the number of servings.  Choose foods with less than 140 mg of sodium per serving.  Avoid foods with 300 mg of sodium or more per serving. Shopping  Look for lower-sodium products, often labeled as "low-sodium" or "no salt added."  Always check the sodium content even if foods are labeled as "unsalted" or "no salt added".  Buy fresh foods. ? Avoid canned foods and premade or frozen meals. ? Avoid canned, cured, or processed meats  Buy breads that have less than 80 mg of sodium per slice. Cooking  Eat more home-cooked food and less restaurant, buffet, and fast food.  Avoid adding salt when cooking. Use salt-free seasonings or herbs instead of table salt or sea salt. Check with your health care provider or pharmacist before using  salt substitutes.  Cook with plant-based oils, such as canola, sunflower, or olive oil. Meal planning  When eating at a restaurant, ask that your food be prepared with less salt or no salt, if possible.  Avoid foods that contain MSG (monosodium glutamate). MSG is sometimes added to [place] food, bouillon, and some canned foods. What foods are recommended? The items listed may not be a complete list. Talk with your  dietitian about what dietary choices are best for you. Grains Low-sodium cereals, including oats, puffed wheat and rice, and shredded wheat. Low-sodium crackers. Unsalted rice. Unsalted pasta. Low-sodium bread. Whole-grain breads and whole-grain pasta. Vegetables Fresh or frozen vegetables. "No salt added" canned vegetables. "No salt added" tomato sauce and paste. Low-sodium or reduced-sodium tomato and vegetable juice. Fruits Fresh, frozen, or canned fruit. Fruit juice. Meats and other protein foods Fresh or frozen (no salt added) meat, poultry, seafood, and fish. Low-sodium canned tuna and salmon. Unsalted nuts. Dried peas, beans, and lentils without added salt. Unsalted canned beans. Eggs. Unsalted nut butters. Dairy Milk. Soy milk. Cheese that is naturally low in sodium, such as ricotta cheese, fresh mozzarella, or Swiss cheese Low-sodium or reduced-sodium cheese. Cream cheese. Yogurt. Fats and oils Unsalted butter. Unsalted margarine with no trans fat. Vegetable oils such as canola or olive oils. Seasonings and other foods Fresh and dried herbs and spices. Salt-free seasonings. Low-sodium mustard and ketchup. Sodium-free salad dressing. Sodium-free light mayonnaise. Fresh or refrigerated horseradish. Lemon juice. Vinegar. Homemade, reduced-sodium, or low-sodium soups. Unsalted popcorn and pretzels. Low-salt or salt-free chips. What foods are not recommended? The items listed may not be a complete list. Talk with your dietitian about what dietary choices are best for you. Grains Instant hot cereals. Bread stuffing, pancake, and biscuit mixes. Croutons. Seasoned rice or pasta mixes. Noodle soup cups. Boxed or frozen macaroni and cheese. Regular salted crackers. Self-rising flour. Vegetables Sauerkraut, pickled vegetables, and relishes. Olives. [place] fries. Onion rings. Regular canned vegetables (not low-sodium or reduced-sodium). Regular canned tomato sauce and paste (not low-sodium or  reduced-sodium). Regular tomato and vegetable juice (not low-sodium or reduced-sodium). Frozen vegetables in sauces. Meats and other protein foods Meat or fish that is salted, canned, smoked, spiced, or pickled. Bacon, ham, sausage, hotdogs, corned beef, chipped beef, packaged lunch meats, salt pork, jerky, pickled herring, anchovies, regular canned tuna, sardines, salted nuts. Dairy Processed cheese and cheese spreads. Cheese curds. Blue cheese. Feta cheese. String cheese. Regular cottage cheese. Buttermilk. Canned milk. Fats and oils Salted butter. Regular margarine. Ghee. Bacon fat. Seasonings and other foods Onion salt, garlic salt, seasoned salt, table salt, and sea salt. Canned and packaged gravies. Worcestershire sauce. Tartar sauce. Barbecue sauce. Teriyaki sauce. Soy sauce, including reduced-sodium. Steak sauce. Fish sauce. Oyster sauce. Cocktail sauce. Horseradish that you find on the shelf. Regular ketchup and mustard. Meat flavorings and tenderizers. Bouillon cubes. Hot sauce and Tabasco sauce. Premade or packaged marinades. Premade or packaged taco seasonings. Relishes. Regular salad dressings. Salsa. Potato and tortilla chips. Corn chips and puffs. Salted popcorn and pretzels. Canned or dried soups. Pizza. Frozen entrees and pot pies. Summary  Eating less sodium can help lower your blood pressure, reduce swelling, and protect your heart, liver, and kidneys.  Most people on this plan should limit their sodium intake to 1,500-2,000 mg (milligrams) of sodium each day.  Canned, boxed, and frozen foods are high in sodium. Restaurant foods, fast foods, and pizza are also very high in sodium. You also get sodium by adding salt to food.  Try to cook at home, eat more fresh fruits and vegetables, and eat less fast food, canned, processed, or prepared foods. This information is not intended to replace advice given to you by your health care provider. Make sure you discuss any questions you have  with your health care provider. Document Revised: 07/15/2017 Document Reviewed: 07/26/2016 Elsevier Patient Education  2020 [place], [name], MD  05/30/2020 9:07 AM    [place]

## 2020-05-26 ENCOUNTER — Telehealth: Payer: Self-pay | Admitting: Internal Medicine

## 2020-05-26 NOTE — Telephone Encounter (Signed)
° ° °  Pt c/o medication issue:  1. Name of Medication:   PRALUENT 150 MG/ML SOAJ    2. How are you currently taking this medication (dosage and times per day)? INJECT 1 DOSE INTO THE SKIN EVERY 14 (FOURTEEN) DAYS.  3. Are you having a reaction (difficulty breathing--STAT)?   4. What is your medication issue? Pt said she doesn't have any Praluent available for Friday. She wanted to speak with RN Eliezer Lofts, to give her follow up about the pt assistance she applied for and if Eliezer Lofts can giver her another sample for praluent.she said [place] can call her today to her mobile #

## 2020-05-26 NOTE — Telephone Encounter (Signed)
Called to let pt know that our office does not currently have any samples of praulent. Advised pt to copy our office on any denial letters for assistance programs she's applied to so that we can approach Praluent for Patient Assistance Support program.

## 2020-05-28 DIAGNOSIS — Z23 Encounter for immunization: Secondary | ICD-10-CM | POA: Diagnosis not present

## 2020-05-28 NOTE — Telephone Encounter (Signed)
Addressed in another encounter

## 2020-05-28 NOTE — Telephone Encounter (Signed)
I messaged Erika - no response yet.  Dr Lemmie Evens

## 2020-05-28 NOTE — Telephone Encounter (Signed)
Left message for patient that we have not heard back from research nurse yet and Dr. Debara Pickett messaged her again today Asked if she applied for Medicare LIS, to please bring copy of denial letter to be submitted to drug company assistance program

## 2020-05-30 ENCOUNTER — Encounter: Payer: Self-pay | Admitting: Interventional Cardiology

## 2020-05-30 ENCOUNTER — Other Ambulatory Visit: Payer: Self-pay

## 2020-05-30 ENCOUNTER — Ambulatory Visit (INDEPENDENT_AMBULATORY_CARE_PROVIDER_SITE_OTHER): Payer: Medicare Other | Admitting: Interventional Cardiology

## 2020-05-30 VITALS — BP 136/76 | HR 81 | Ht 62.0 in | Wt 192.2 lb

## 2020-05-30 DIAGNOSIS — I10 Essential (primary) hypertension: Secondary | ICD-10-CM | POA: Diagnosis not present

## 2020-05-30 DIAGNOSIS — E785 Hyperlipidemia, unspecified: Secondary | ICD-10-CM | POA: Diagnosis not present

## 2020-05-30 DIAGNOSIS — Z9989 Dependence on other enabling machines and devices: Secondary | ICD-10-CM

## 2020-05-30 DIAGNOSIS — G4733 Obstructive sleep apnea (adult) (pediatric): Secondary | ICD-10-CM | POA: Diagnosis not present

## 2020-05-30 DIAGNOSIS — Z7189 Other specified counseling: Secondary | ICD-10-CM

## 2020-05-30 DIAGNOSIS — I739 Peripheral vascular disease, unspecified: Secondary | ICD-10-CM

## 2020-05-30 DIAGNOSIS — E669 Obesity, unspecified: Secondary | ICD-10-CM | POA: Diagnosis not present

## 2020-05-30 DIAGNOSIS — I25810 Atherosclerosis of coronary artery bypass graft(s) without angina pectoris: Secondary | ICD-10-CM

## 2020-05-30 NOTE — Patient Instructions (Signed)
Medication Instructions:  Your physician recommends that you continue on your current medications as directed. Please refer to the Current Medication list given to you today.  *If you need a refill on your cardiac medications before your next appointment, please call your pharmacy*   Lab Work: None If you have labs (blood work) drawn today and your tests are completely normal, you will receive your results only by: Marland Kitchen MyChart Message (if you have MyChart) OR . A paper copy in the mail If you have any lab test that is abnormal or we need to change your treatment, we will call you to review the results.   Testing/Procedures: None   Follow-Up: At [facility], you and your health needs are our priority.  As part of our continuing mission to provide you with exceptional heart care, we have created designated Provider Care Teams.  These Care Teams include your primary Cardiologist (physician) and Advanced Practice Providers (APPs -  Physician Assistants and Nurse Practitioners) who all work together to provide you with the care you need, when you need it.  We recommend signing up for the patient portal called "MyChart".  Sign up information is provided on this After Visit Summary.  MyChart is used to connect with patients for Virtual Visits (Telemedicine).  Patients are able to view lab/test results, encounter notes, upcoming appointments, etc.  Non-urgent messages can be sent to your provider as well.   To learn more about what you can do with MyChart, go to [email or web address].    Your next appointment:   12 month(s)  The format for your next appointment:   In Person  Provider:   You may see [name], MD or one of the following Advanced Practice Providers on your designated Care Team:    Truitt Merle, NP  Cecilie Kicks, NP  Kathyrn Drown, NP    Other Instructions   Low-Sodium Eating Plan Sodium, which is an element that makes up salt, helps you maintain a  healthy balance of fluids in your body. Too much sodium can increase your blood pressure and cause fluid and waste to be held in your body. Your health care provider or dietitian may recommend following this plan if you have high blood pressure (hypertension), kidney disease, liver disease, or heart failure. Eating less sodium can help lower your blood pressure, reduce swelling, and protect your heart, liver, and kidneys. What are tips for following this plan? General guidelines  Most people on this plan should limit their sodium intake to 1,500-2,000 mg (milligrams) of sodium each day. Reading food labels   The Nutrition Facts label lists the amount of sodium in one serving of the food. If you eat more than one serving, you must multiply the listed amount of sodium by the number of servings.  Choose foods with less than 140 mg of sodium per serving.  Avoid foods with 300 mg of sodium or more per serving. Shopping  Look for lower-sodium products, often labeled as "low-sodium" or "no salt added."  Always check the sodium content even if foods are labeled as "unsalted" or "no salt added".  Buy fresh foods. ? Avoid canned foods and premade or frozen meals. ? Avoid canned, cured, or processed meats  Buy breads that have less than 80 mg of sodium per slice. Cooking  Eat more home-cooked food and less restaurant, buffet, and fast food.  Avoid adding salt when cooking. Use salt-free seasonings or herbs instead of table salt or sea salt. Check with  your health care provider or pharmacist before using salt substitutes.  Cook with plant-based oils, such as canola, sunflower, or olive oil. Meal planning  When eating at a restaurant, ask that your food be prepared with less salt or no salt, if possible.  Avoid foods that contain MSG (monosodium glutamate). MSG is sometimes added to [place] food, bouillon, and some canned foods. What foods are recommended? The items listed may not be a  complete list. Talk with your dietitian about what dietary choices are best for you. Grains Low-sodium cereals, including oats, puffed wheat and rice, and shredded wheat. Low-sodium crackers. Unsalted rice. Unsalted pasta. Low-sodium bread. Whole-grain breads and whole-grain pasta. Vegetables Fresh or frozen vegetables. "No salt added" canned vegetables. "No salt added" tomato sauce and paste. Low-sodium or reduced-sodium tomato and vegetable juice. Fruits Fresh, frozen, or canned fruit. Fruit juice. Meats and other protein foods Fresh or frozen (no salt added) meat, poultry, seafood, and fish. Low-sodium canned tuna and salmon. Unsalted nuts. Dried peas, beans, and lentils without added salt. Unsalted canned beans. Eggs. Unsalted nut butters. Dairy Milk. Soy milk. Cheese that is naturally low in sodium, such as ricotta cheese, fresh mozzarella, or Swiss cheese Low-sodium or reduced-sodium cheese. Cream cheese. Yogurt. Fats and oils Unsalted butter. Unsalted margarine with no trans fat. Vegetable oils such as canola or olive oils. Seasonings and other foods Fresh and dried herbs and spices. Salt-free seasonings. Low-sodium mustard and ketchup. Sodium-free salad dressing. Sodium-free light mayonnaise. Fresh or refrigerated horseradish. Lemon juice. Vinegar. Homemade, reduced-sodium, or low-sodium soups. Unsalted popcorn and pretzels. Low-salt or salt-free chips. What foods are not recommended? The items listed may not be a complete list. Talk with your dietitian about what dietary choices are best for you. Grains Instant hot cereals. Bread stuffing, pancake, and biscuit mixes. Croutons. Seasoned rice or pasta mixes. Noodle soup cups. Boxed or frozen macaroni and cheese. Regular salted crackers. Self-rising flour. Vegetables Sauerkraut, pickled vegetables, and relishes. Olives. [place] fries. Onion rings. Regular canned vegetables (not low-sodium or reduced-sodium). Regular canned tomato sauce and  paste (not low-sodium or reduced-sodium). Regular tomato and vegetable juice (not low-sodium or reduced-sodium). Frozen vegetables in sauces. Meats and other protein foods Meat or fish that is salted, canned, smoked, spiced, or pickled. Bacon, ham, sausage, hotdogs, corned beef, chipped beef, packaged lunch meats, salt pork, jerky, pickled herring, anchovies, regular canned tuna, sardines, salted nuts. Dairy Processed cheese and cheese spreads. Cheese curds. Blue cheese. Feta cheese. String cheese. Regular cottage cheese. Buttermilk. Canned milk. Fats and oils Salted butter. Regular margarine. Ghee. Bacon fat. Seasonings and other foods Onion salt, garlic salt, seasoned salt, table salt, and sea salt. Canned and packaged gravies. Worcestershire sauce. Tartar sauce. Barbecue sauce. Teriyaki sauce. Soy sauce, including reduced-sodium. Steak sauce. Fish sauce. Oyster sauce. Cocktail sauce. Horseradish that you find on the shelf. Regular ketchup and mustard. Meat flavorings and tenderizers. Bouillon cubes. Hot sauce and Tabasco sauce. Premade or packaged marinades. Premade or packaged taco seasonings. Relishes. Regular salad dressings. Salsa. Potato and tortilla chips. Corn chips and puffs. Salted popcorn and pretzels. Canned or dried soups. Pizza. Frozen entrees and pot pies. Summary  Eating less sodium can help lower your blood pressure, reduce swelling, and protect your heart, liver, and kidneys.  Most people on this plan should limit their sodium intake to 1,500-2,000 mg (milligrams) of sodium each day.  Canned, boxed, and frozen foods are high in sodium. Restaurant foods, fast foods, and pizza are also very high in sodium. You also  get sodium by adding salt to food.  Try to cook at home, eat more fresh fruits and vegetables, and eat less fast food, canned, processed, or prepared foods. This information is not intended to replace advice given to you by your health care provider. Make sure you  discuss any questions you have with your health care provider. Document Revised: 07/15/2017 Document Reviewed: 07/26/2016 Elsevier Patient Education  2020 [employer].

## 2020-06-04 ENCOUNTER — Telehealth: Payer: Self-pay | Admitting: Interventional Cardiology

## 2020-06-04 ENCOUNTER — Telehealth: Payer: Self-pay | Admitting: Internal Medicine

## 2020-06-04 NOTE — Telephone Encounter (Signed)
Spoke with patient and she has been out of Praluent so she resumed atorvastatin 10mg  on MWF Provided her sample of Praluent to be picked up Explained she may be eligible for clinical trial   She was unsuccessful in reaching someone with Medicare LIS  ----- Message -----  From: Timoteo Gaul, RN  Sent: 05/30/2020  3:17 PM EDT  To: Pixie Casino, MD   Hi Dr. Debara Pickett,   I looked at this lady and it looks like we would just have to wait until she's at that 3 month mark before bringing her in for screening most likely in December but otherwise looks good.

## 2020-06-04 NOTE — Telephone Encounter (Signed)
Spoke with the pt and she wanted to let Dr. Debara Pickett. Eliezer Lofts know that she talked with Research nurse and when she asked if she was getting a Placebo the nurse advised her it is randomized and we will not know.. the pt is very uncomfortable with this and says she does not wan to participate in it taking the chance she will not get what she needs and she is frustrated that they cannot get her in until 08/2020.   Pt is asking to come by and pick up Praluent.. I advised her to wait until I can send a message to [place] at the office and she can review.

## 2020-06-04 NOTE — Telephone Encounter (Signed)
Carolyn Fleming is calling requesting to speak with Eliezer Lofts in regards to some information about the study she is being put in. Please advise.

## 2020-06-04 NOTE — Telephone Encounter (Signed)
She has to be off PCSK9 inhibitors for at least 3 months before qualifying for the Orion-4 trial  Dr Lemmie Evens

## 2020-06-04 NOTE — Telephone Encounter (Signed)
Spoke with patient who would like more information about Orion-4 trial, what it would entail etc before deciding if she wants to participate, especially given that we can provide samples of Praluent every so often   Will check with research nurse

## 2020-06-04 NOTE — Telephone Encounter (Signed)
   Pt c/o medication issue:  1. Name of Medication:  PRALUENT 150 MG/ML SOAJ  2. How are you currently taking this medication (dosage and times per day)?   No, was told to stop until December if she wants to get into the new trial   3. Are you having a reaction (difficulty breathing--STAT)?   no  4. What is your medication issue?   Was doing PRALUENT 150 MG/ML SOAJ every two weeks. Couldn't get anymore because she was no longer on the Health Well program. Per patient there is a new trial recommended by Dr. Debara Pickett that allows her an injection every 6 months with a different medicine. Patient is concerned about doing an injection every 6 months as opposed to what she was used to with the praluent every two weeks. Patient would like to know what Dr. [name] thinks of the different medication/trial.

## 2020-06-04 NOTE — Telephone Encounter (Signed)
The patient cannot get Praluent anymore because the Health Well program is now closed.  The patient spoke with the Research nurse and another triage nurse today (see other 10/20 note) and the patient is uncomfortable with doing a trial not knowing if she will get medication or placebo. Ultimately, the other message was sent to pharmD because the patient wants to restart Praluent.   Left message for patient that Dr. [name] and his nurse are not in the office this week, but Anderson Malta will call for an update when she returns.

## 2020-06-05 NOTE — Telephone Encounter (Signed)
Spoke with patient about clinical trial concerns. She states she was in a trial previously that was a PO medication, and was given a placebo and then shortly after her cholesterol was very high and she had a cardiac event. She is not interested in a trial that involves a placebo again.   Explained medicare low-income subsidy program again - helped answered questions to determine that she may would qualify. She plans to work on the application herself

## 2020-06-09 NOTE — Telephone Encounter (Signed)
Pt spoke with Eliezer Lofts, RN for Dr. Debara Pickett, last week about low income subsidy paperwork.  Pt is planning to fill this out.

## 2020-06-16 DIAGNOSIS — Z6830 Body mass index (BMI) 30.0-30.9, adult: Secondary | ICD-10-CM | POA: Diagnosis not present

## 2020-06-16 DIAGNOSIS — Z124 Encounter for screening for malignant neoplasm of cervix: Secondary | ICD-10-CM | POA: Diagnosis not present

## 2020-06-16 DIAGNOSIS — Z1272 Encounter for screening for malignant neoplasm of vagina: Secondary | ICD-10-CM | POA: Diagnosis not present

## 2020-06-16 DIAGNOSIS — Z01419 Encounter for gynecological examination (general) (routine) without abnormal findings: Secondary | ICD-10-CM | POA: Diagnosis not present

## 2020-06-22 ENCOUNTER — Other Ambulatory Visit: Payer: Self-pay | Admitting: Family Medicine

## 2020-06-22 DIAGNOSIS — G4700 Insomnia, unspecified: Secondary | ICD-10-CM

## 2020-06-25 ENCOUNTER — Other Ambulatory Visit: Payer: Self-pay | Admitting: Family Medicine

## 2020-06-27 ENCOUNTER — Telehealth: Payer: Self-pay | Admitting: Internal Medicine

## 2020-06-27 NOTE — Telephone Encounter (Signed)
Fax PA form for Praluent received [facility]) Attempted to submit renewal via CMM (as was approved >1 year ago) Prior authorization not required

## 2020-06-28 ENCOUNTER — Other Ambulatory Visit: Payer: Self-pay | Admitting: Interventional Cardiology

## 2020-06-30 DIAGNOSIS — L668 Other cicatricial alopecia: Secondary | ICD-10-CM | POA: Diagnosis not present

## 2020-06-30 DIAGNOSIS — L821 Other seborrheic keratosis: Secondary | ICD-10-CM | POA: Diagnosis not present

## 2020-07-21 ENCOUNTER — Other Ambulatory Visit: Payer: Self-pay

## 2020-07-21 ENCOUNTER — Other Ambulatory Visit (INDEPENDENT_AMBULATORY_CARE_PROVIDER_SITE_OTHER): Payer: Medicare Other

## 2020-07-21 ENCOUNTER — Other Ambulatory Visit: Payer: Medicare Other

## 2020-07-21 DIAGNOSIS — E079 Disorder of thyroid, unspecified: Secondary | ICD-10-CM

## 2020-07-21 LAB — TSH: TSH: 3.07 u[IU]/mL (ref 0.35–4.50)

## 2020-07-21 LAB — T4, FREE: Free T4: 0.57 ng/dL — ABNORMAL LOW (ref 0.60–1.60)

## 2020-07-23 ENCOUNTER — Ambulatory Visit: Payer: Medicare Other | Admitting: Family Medicine

## 2020-07-24 ENCOUNTER — Encounter: Payer: Self-pay | Admitting: Family Medicine

## 2020-07-24 ENCOUNTER — Telehealth (INDEPENDENT_AMBULATORY_CARE_PROVIDER_SITE_OTHER): Payer: Medicare Other | Admitting: Family Medicine

## 2020-07-24 ENCOUNTER — Other Ambulatory Visit: Payer: Self-pay

## 2020-07-24 DIAGNOSIS — E079 Disorder of thyroid, unspecified: Secondary | ICD-10-CM | POA: Diagnosis not present

## 2020-07-24 DIAGNOSIS — J069 Acute upper respiratory infection, unspecified: Secondary | ICD-10-CM

## 2020-07-24 NOTE — Progress Notes (Addendum)
Virtual Visit via Video Note Unable to see patient's video on video call, however audio was working and patient can see and hear this provider.  I connected with Carolyn Fleming on 07/24/20 at  8:00 AM EST by a video enabled telemedicine application 2/2 [id number] pandemic and verified that I am speaking with the correct person using two identifiers.  Location patient: home Location provider:work or home office Persons participating in the virtual visit: patient, provider  I discussed the limitations of evaluation and management by telemedicine and the availability of in person appointments. The patient expressed understanding and agreed to proceed.   HPI: Pt states she has a sinus infection.  Endorses nasal congestion, pain around temple, heavy eyes, mild cough, R ear pain, vertigo x 3 days.  Unable to use CPAP. Denies fever, chills, n/v, diarrhea. Tried antivert for symptoms.  In the past flonase caused epistaxis after using for several months. Taking claritin nightly.   Patient inquires about thyroid lab results.  In the past TSH elevated T4 normal.  Patient not currently on synthroid.  ROS: See pertinent positives and negatives per HPI.  Past Medical History:  Diagnosis Date  . Arthritis   . Asthma   . Breast cancer ([place])    Right breast   . GERD (gastroesophageal reflux disease)   . Glaucoma   . Heart disease   . Hyperlipidemia   . Hypertension 01/25/2012  . Obesity (BMI 35.0-39.9 without comorbidity) 01/25/2012  . S/P CABG x 2 01/24/2012   Emergency LIMA to LAD, SVG to OM1, EVH via right thigh  . Sleep apnea    uses cpap  . Thyroid disease     Past Surgical History:  Procedure Laterality Date  . ABDOMINAL HYSTERECTOMY    . BREAST LUMPECTOMY  1997   breast cancer  . CARDIAC CATHETERIZATION N/A 05/07/2015   Procedure: Left Heart Cath and Cors/Grafts Angiography;  Surgeon: [name], MD;  Location: [place] [facility];  Service: Cardiovascular;  Laterality: N/A;  .  CHOLECYSTECTOMY    . CORONARY ARTERY BYPASS GRAFT  01/24/2012   Procedure: CORONARY ARTERY BYPASS GRAFTING (CABG);  Surgeon: [name], MD;  Location: [place];  Service: Open Heart Surgery;  Laterality: N/A;  Coronary Artery bypass Graft times two utilizing the left internal mammary artery and the right greater saphenous vein harvested endoscopically,  Transesophageal Echocardiogram  . LEFT HEART CATHETERIZATION WITH CORONARY ANGIOGRAM N/A 01/24/2012   Procedure: LEFT HEART CATHETERIZATION WITH CORONARY ANGIOGRAM;  Surgeon: [name], MD;  Location: [facility];  Service: Cardiovascular;  Laterality: N/A;  . TONSILLECTOMY    . TUBAL LIGATION      Family History  Problem Relation Age of Onset  . Cancer Mother        Colon  . Diabetes Mother   . Hypertension Mother   . Diabetes Sister   . Cancer Brother        Lung  . Diabetes Brother   . Hypertension Brother       Current Outpatient Medications:  .  amLODipine (NORVASC) 5 MG tablet, TAKE 1 TABLET BY MOUTH EVERY DAY, Disp: 90 tablet, Rfl: 3 .  aspirin (ASPIRIN 81) 81 MG chewable tablet, Aspir-81, Disp: , Rfl:  .  Azelastine HCl 0.15 % SOLN, azelastine 0.15 % (205.5 mcg) nasal spray  USE 1 SPRAY INTO EACH NOSTRIL TWICE A DAY, Disp: , Rfl:  .  diazepam (VALIUM) 5 MG tablet, Take 1 tablet (5 mg total) by mouth every  8 (eight) hours as needed (dizziness)., Disp: 30 tablet, Rfl: 0 .  Dorzolamide HCl-Timolol Mal PF 2-0.5 % SOLN, Place 2 % into the left eye in the morning and at bedtime. , Disp: , Rfl:  .  hydrocortisone cream 1 %, Apply 1 application topically as needed for itching., Disp: , Rfl:  .  hydrOXYzine (ATARAX/VISTARIL) 25 MG tablet, TAKE 1 TABLET BY MOUTH AT BEDTIME AS NEEDED (INSOMNIA)., Disp: 90 tablet, Rfl: 0 .  ketoconazole (NIZORAL) 2 % cream, Apply 1 application topically as needed for irritation., Disp: , Rfl:  .  loratadine (CLARITIN) 10 MG tablet, TAKE 1 TABLET BY MOUTH EVERY DAY, Disp: 90 tablet, Rfl: 0 .   losartan-hydrochlorothiazide (HYZAAR) 50-12.5 MG tablet, TAKE 1 TABLET BY MOUTH EVERY DAY, Disp: 90 tablet, Rfl: 3 .  meclizine (ANTIVERT) 12.5 MG tablet, Take 1 tablet (12.5 mg total) by mouth 3 (three) times daily as needed for dizziness., Disp: 90 tablet, Rfl: 0 .  NITROSTAT 0.4 MG SL tablet, Place 1 tablet (0.4 mg total) under the tongue every 5 (five) minutes as needed for chest pain., Disp: 25 tablet, Rfl: 6 .  ondansetron (ZOFRAN) 8 MG tablet, TAKE 1 TABLET BY MOUTH TWICE A DAY AS NEEDED FOR NAUSEA, Disp: , Rfl: 1 .  pantoprazole (PROTONIX) 40 MG tablet, Take 1 tablet (40 mg total) by mouth daily., Disp: 30 tablet, Rfl: 3 .  PRALUENT 150 MG/ML SOAJ, INJECT 1 DOSE INTO THE SKIN EVERY 14 (FOURTEEN) DAYS., Disp: 2 mL, Rfl: 11 .  Travoprost, BAK Free, (TRAVATAN Z) 0.004 % SOLN ophthalmic solution, Place 1 drop into the left eye. , Disp: , Rfl:   EXAM:  VITALS per patient if applicable:   RR 91-47 bpm  GENERAL: alert, oriented, appears well and in no acute distress  HEENT: atraumatic, conjunctiva clear, no obvious abnormalities on inspection of external nose and ears  NECK: normal movements of the head and neck  LUNGS: on inspection no signs of respiratory distress, breathing rate appears normal, no obvious gross SOB, gasping or wheezing  CV: no obvious cyanosis  MS: moves all visible extremities without noticeable abnormality  PSYCH/NEURO: pleasant and cooperative, no obvious depression or anxiety, speech and thought processing grossly intact  ASSESSMENT AND PLAN:  Discussed the following assessment and plan:  Viral URI  -Reviewed possible causes including acute nasopharyngitis, however cannot rule out COVID-19 virus infection.  Given symptoms and duration less likely sinusitis though could develop with continued or worsening symptoms. -supportive care including Tylenol/NSAIDs for any pain/discomfort, Mucinex or  Delsym, fluids, rest, etc. -Patient advised to try nasal saline  rinse or Nasacort -Continue to monitor.  Thyroid dysfunction -TSH 3.07 and free T4 0.57 on 07/21/2020 -Continue to monitor.   Follow-up as needed   I discussed the assessment and treatment plan with the patient. The patient was provided an opportunity to ask questions and all were answered. The patient agreed with the plan and demonstrated an understanding of the instructions.   The patient was advised to call back or seek an in-person evaluation if the symptoms worsen or if the condition fails to improve as anticipated.  I provided 11 minutes of non-face-to-face time during this encounter.   Billie Ruddy, MD

## 2020-07-28 ENCOUNTER — Telehealth: Payer: Self-pay | Admitting: Family Medicine

## 2020-07-28 NOTE — Telephone Encounter (Signed)
The patient is wanting Dr. Volanda Napoleon to send in a new Rx for Singular because she has been taking loratadine (CLARITIN) 10 MG tablet for years and it is not working any more because she keeps getting sinus infections.   [facility], [place] Phone:  [phone]  Fax:  [phone]

## 2020-07-29 ENCOUNTER — Other Ambulatory Visit: Payer: Self-pay | Admitting: Interventional Cardiology

## 2020-07-30 DIAGNOSIS — M25561 Pain in right knee: Secondary | ICD-10-CM | POA: Diagnosis not present

## 2020-07-31 DIAGNOSIS — G4733 Obstructive sleep apnea (adult) (pediatric): Secondary | ICD-10-CM | POA: Diagnosis not present

## 2020-08-13 ENCOUNTER — Other Ambulatory Visit: Payer: Self-pay | Admitting: Family Medicine

## 2020-08-13 DIAGNOSIS — Z1231 Encounter for screening mammogram for malignant neoplasm of breast: Secondary | ICD-10-CM | POA: Diagnosis not present

## 2020-08-13 DIAGNOSIS — J302 Other seasonal allergic rhinitis: Secondary | ICD-10-CM

## 2020-08-13 MED ORDER — MONTELUKAST SODIUM 10 MG PO TABS: 10.0000 mg | ORAL_TABLET | Freq: Every day | ORAL | 3 refills | Status: DC

## 2020-08-13 NOTE — Addendum Note (Signed)
Addended by: Abbe Amsterdam R on: 08/13/2020 01:12 PM   Modules accepted: Orders

## 2020-08-13 NOTE — Telephone Encounter (Signed)
Rx for Singulair sent to pharmacy.

## 2020-08-16 ENCOUNTER — Other Ambulatory Visit: Payer: Self-pay | Admitting: Interventional Cardiology

## 2020-08-20 ENCOUNTER — Telehealth: Payer: Self-pay | Admitting: Internal Medicine

## 2020-08-20 NOTE — Telephone Encounter (Signed)
     Pt c/o medication issue:  1. Name of Medication: Praluent   2. How are you currently taking this medication (dosage and times per day)?   3. Are you having a reaction (difficulty breathing--STAT)?   4. What is your medication issue? Pt would like to notify Eileen Stanford that she will try to apply again to health well foundation for her Praleunt

## 2020-08-20 NOTE — Telephone Encounter (Signed)
Spoke with patient - re-enrollment funds for [email or web address] are open. She will apply again. No further assistance needed

## 2020-08-25 NOTE — Telephone Encounter (Signed)
Received fax that patient has been approved for grant from [facility] from 07/23/2020 - 07/22/2021 Healthwell ID: [id number]  Pharmacy card ID: [id number] PC Group: [id number] PC BIN: [id number] PC PCN: [id number] PC Processor: PDMI

## 2020-08-28 MED ORDER — PRALUENT 150 MG/ML ~~LOC~~ SOAJ
1.0000 | SUBCUTANEOUS | 11 refills | Status: DC
Start: 2020-08-28 — End: 2021-10-23

## 2020-08-28 NOTE — Addendum Note (Signed)
Addended by: Fidel Levy on: 08/28/2020 02:47 PM   Modules accepted: Orders

## 2020-08-28 NOTE — Telephone Encounter (Signed)
Per healthwell foundation provider portal, her grant is for $2500

## 2020-08-28 NOTE — Telephone Encounter (Signed)
Patient aware of approval  Rx(s) sent to pharmacy electronically.

## 2020-08-28 NOTE — Telephone Encounter (Signed)
Patient calling back. She states she does not know how much the grant is for and would like a call back.

## 2020-08-29 DIAGNOSIS — J0101 Acute recurrent maxillary sinusitis: Secondary | ICD-10-CM | POA: Diagnosis not present

## 2020-09-13 ENCOUNTER — Other Ambulatory Visit: Payer: Self-pay | Admitting: Family Medicine

## 2020-09-13 DIAGNOSIS — G4700 Insomnia, unspecified: Secondary | ICD-10-CM

## 2020-09-30 DIAGNOSIS — M1711 Unilateral primary osteoarthritis, right knee: Secondary | ICD-10-CM | POA: Diagnosis not present

## 2020-10-06 DIAGNOSIS — H401113 Primary open-angle glaucoma, right eye, severe stage: Secondary | ICD-10-CM | POA: Diagnosis not present

## 2020-10-06 DIAGNOSIS — H401122 Primary open-angle glaucoma, left eye, moderate stage: Secondary | ICD-10-CM | POA: Diagnosis not present

## 2020-10-14 DIAGNOSIS — R7303 Prediabetes: Secondary | ICD-10-CM | POA: Diagnosis not present

## 2020-10-14 DIAGNOSIS — E782 Mixed hyperlipidemia: Secondary | ICD-10-CM | POA: Diagnosis not present

## 2020-10-14 DIAGNOSIS — R946 Abnormal results of thyroid function studies: Secondary | ICD-10-CM | POA: Diagnosis not present

## 2020-10-14 DIAGNOSIS — I10 Essential (primary) hypertension: Secondary | ICD-10-CM | POA: Diagnosis not present

## 2020-12-29 DIAGNOSIS — Z23 Encounter for immunization: Secondary | ICD-10-CM | POA: Diagnosis not present

## 2021-01-15 DIAGNOSIS — N61 Mastitis without abscess: Secondary | ICD-10-CM | POA: Diagnosis not present

## 2021-01-29 DIAGNOSIS — N61 Mastitis without abscess: Secondary | ICD-10-CM | POA: Diagnosis not present

## 2021-02-02 ENCOUNTER — Other Ambulatory Visit: Payer: Self-pay | Admitting: *Deleted

## 2021-02-02 DIAGNOSIS — I739 Peripheral vascular disease, unspecified: Secondary | ICD-10-CM

## 2021-02-02 DIAGNOSIS — I25810 Atherosclerosis of coronary artery bypass graft(s) without angina pectoris: Secondary | ICD-10-CM

## 2021-02-02 DIAGNOSIS — E785 Hyperlipidemia, unspecified: Secondary | ICD-10-CM

## 2021-02-02 DIAGNOSIS — I10 Essential (primary) hypertension: Secondary | ICD-10-CM

## 2021-02-09 DIAGNOSIS — L720 Epidermal cyst: Secondary | ICD-10-CM | POA: Diagnosis not present

## 2021-02-18 DIAGNOSIS — N61 Mastitis without abscess: Secondary | ICD-10-CM | POA: Diagnosis not present

## 2021-02-26 DIAGNOSIS — N61 Mastitis without abscess: Secondary | ICD-10-CM | POA: Diagnosis not present

## 2021-03-04 DIAGNOSIS — H401122 Primary open-angle glaucoma, left eye, moderate stage: Secondary | ICD-10-CM | POA: Diagnosis not present

## 2021-03-04 DIAGNOSIS — N61 Mastitis without abscess: Secondary | ICD-10-CM | POA: Diagnosis not present

## 2021-03-04 DIAGNOSIS — H401113 Primary open-angle glaucoma, right eye, severe stage: Secondary | ICD-10-CM | POA: Diagnosis not present

## 2021-03-05 ENCOUNTER — Other Ambulatory Visit: Payer: Self-pay | Admitting: Family Medicine

## 2021-03-05 DIAGNOSIS — N63 Unspecified lump in unspecified breast: Secondary | ICD-10-CM

## 2021-03-06 ENCOUNTER — Other Ambulatory Visit: Payer: Self-pay | Admitting: Family Medicine

## 2021-03-06 DIAGNOSIS — N63 Unspecified lump in unspecified breast: Secondary | ICD-10-CM

## 2021-03-09 ENCOUNTER — Telehealth: Payer: Self-pay | Admitting: Internal Medicine

## 2021-03-09 NOTE — Telephone Encounter (Signed)
New Message:     Pt c/o medication issue:  1. Name of Medication: Augmentin -before she took the Augmentin, she took Doxcycline 200 mg for 5 days  2 times a day for 5 days-- Cephalexin 500 mg 2 times a day for 10 days- now she is taking Doxcycline  100 mg 2 times a day for 14 days    2. How are you currently taking this medication (dosage and times per day)?  2 tablets 1000 mg a day for 5days- they wanted her to take it for 14 days, she could not it made her so sick  3. Are you having a reaction (difficulty breathing--STAT)? Heart palpitations. sweating and lightheaded  4. What is your medication issue? Had terrible reaction to this medicine- her concern is she should  still take her  Praluent on Friday?

## 2021-03-09 NOTE — Telephone Encounter (Signed)
Tried to call pt but unable to [facility], will forward message and CB later

## 2021-03-09 NOTE — Telephone Encounter (Signed)
Yes .. ok to take the Praluent on schedule  Dr Lemmie Evens

## 2021-03-10 NOTE — Telephone Encounter (Signed)
Tried to call pt, phone just keeps ringing. No VM. Wil have to calll later

## 2021-03-11 DIAGNOSIS — H401122 Primary open-angle glaucoma, left eye, moderate stage: Secondary | ICD-10-CM | POA: Diagnosis not present

## 2021-03-11 DIAGNOSIS — H401113 Primary open-angle glaucoma, right eye, severe stage: Secondary | ICD-10-CM | POA: Diagnosis not present

## 2021-03-11 NOTE — Telephone Encounter (Signed)
Attempted to call patient x2 - "call unable to be completed at this time"

## 2021-03-13 NOTE — Telephone Encounter (Signed)
Spoke with patient and advised her OK to take Praluent on schedule, as directed

## 2021-03-17 ENCOUNTER — Other Ambulatory Visit: Payer: Medicare Other

## 2021-03-18 ENCOUNTER — Ambulatory Visit: Payer: Medicare Other

## 2021-03-18 ENCOUNTER — Ambulatory Visit
Admission: RE | Admit: 2021-03-18 | Discharge: 2021-03-18 | Disposition: A | Discharge status: Home / Self Care | Payer: Medicare Other | Source: Ambulatory Visit | Attending: Family Medicine | Admitting: Family Medicine

## 2021-03-18 ENCOUNTER — Other Ambulatory Visit: Payer: Self-pay | Admitting: Family Medicine

## 2021-03-18 ENCOUNTER — Other Ambulatory Visit: Payer: Self-pay

## 2021-03-18 DIAGNOSIS — N63 Unspecified lump in unspecified breast: Secondary | ICD-10-CM

## 2021-03-18 DIAGNOSIS — N61 Mastitis without abscess: Secondary | ICD-10-CM | POA: Diagnosis not present

## 2021-03-18 DIAGNOSIS — Z853 Personal history of malignant neoplasm of breast: Secondary | ICD-10-CM | POA: Diagnosis not present

## 2021-03-18 DIAGNOSIS — R922 Inconclusive mammogram: Secondary | ICD-10-CM | POA: Diagnosis not present

## 2021-03-26 DIAGNOSIS — I10 Essential (primary) hypertension: Secondary | ICD-10-CM | POA: Diagnosis not present

## 2021-03-26 DIAGNOSIS — I739 Peripheral vascular disease, unspecified: Secondary | ICD-10-CM | POA: Diagnosis not present

## 2021-03-26 DIAGNOSIS — I25810 Atherosclerosis of coronary artery bypass graft(s) without angina pectoris: Secondary | ICD-10-CM | POA: Diagnosis not present

## 2021-03-26 DIAGNOSIS — E785 Hyperlipidemia, unspecified: Secondary | ICD-10-CM | POA: Diagnosis not present

## 2021-03-26 LAB — LIPID PANEL
Chol/HDL Ratio: 3.1 ratio (ref 0.0–4.4)
Cholesterol, Total: 120 mg/dL (ref 100–199)
HDL: 39 mg/dL — ABNORMAL LOW (ref >39)
LDL Chol Calc (NIH): 57 mg/dL (ref 0–99)
Triglycerides: 140 mg/dL (ref 0–149)
VLDL Cholesterol Cal: 24 mg/dL (ref 5–40)

## 2021-04-02 ENCOUNTER — Telehealth (INDEPENDENT_AMBULATORY_CARE_PROVIDER_SITE_OTHER): Payer: Medicare Other | Admitting: Internal Medicine

## 2021-04-02 ENCOUNTER — Encounter: Payer: Self-pay | Admitting: Internal Medicine

## 2021-04-02 VITALS — Wt 189.0 lb

## 2021-04-02 DIAGNOSIS — T466X5A Adverse effect of antihyperlipidemic and antiarteriosclerotic drugs, initial encounter: Secondary | ICD-10-CM | POA: Diagnosis not present

## 2021-04-02 DIAGNOSIS — E785 Hyperlipidemia, unspecified: Secondary | ICD-10-CM | POA: Diagnosis not present

## 2021-04-02 DIAGNOSIS — M791 Myalgia, unspecified site: Secondary | ICD-10-CM | POA: Diagnosis not present

## 2021-04-02 DIAGNOSIS — I25810 Atherosclerosis of coronary artery bypass graft(s) without angina pectoris: Secondary | ICD-10-CM | POA: Diagnosis not present

## 2021-04-02 DIAGNOSIS — I10 Essential (primary) hypertension: Secondary | ICD-10-CM | POA: Diagnosis not present

## 2021-04-02 DIAGNOSIS — I739 Peripheral vascular disease, unspecified: Secondary | ICD-10-CM | POA: Diagnosis not present

## 2021-04-02 NOTE — Patient Instructions (Signed)
Medication Instructions:  Your physician recommends that you continue on your current medications as directed. Please refer to the Current Medication list given to you today.  *If you need a refill on your cardiac medications before your next appointment, please call your pharmacy*   Lab Work: FASTING lab work in 1 year to check cholesterol  -- complete about 1 week before your next visit  If you have labs (blood work) drawn today and your tests are completely normal, you will receive your results only by: [place] (if you have MyChart) OR A paper copy in the mail If you have any lab test that is abnormal or we need to change your treatment, we will call you to review the results.   Testing/Procedures: NONE   Follow-Up: At [facility], you and your health needs are our priority.  As part of our continuing mission to provide you with exceptional heart care, we have created designated Provider Care Teams.  These Care Teams include your primary Cardiologist (physician) and Advanced Practice Providers (APPs -  Physician Assistants and Nurse Practitioners) who all work together to provide you with the care you need, when you need it.  We recommend signing up for the patient portal called "MyChart".  Sign up information is provided on this After Visit Summary.  MyChart is used to connect with patients for Virtual Visits (Telemedicine).  Patients are able to view lab/test results, encounter notes, upcoming appointments, etc.  Non-urgent messages can be sent to your provider as well.   To learn more about what you can do with MyChart, go to [email or web address].    Your next appointment:   12 month(s) - lipid clinic  The format for your next appointment:   In Person or Virtual  Provider:   K. Mali Hilty, MD   Other Instructions

## 2021-04-02 NOTE — Progress Notes (Signed)
Virtual Visit via Video Note   This visit type was conducted due to national recommendations for restrictions regarding the COVID-19 Pandemic (e.g. social distancing) in an effort to limit this patient's exposure and mitigate transmission in our community.  Due to her co-morbid illnesses, this patient is at least at moderate risk for complications without adequate follow up.  This format is felt to be most appropriate for this patient at this time.  All issues noted in this document were discussed and addressed.  A limited physical exam was performed with this format.  Please refer to the patient's chart for her consent to telehealth for [facility].      Date:  04/02/2021   ID:  [name], DOB Jan 10, 1951, MRN [id number] The patient was identified using 2 identifiers.  Evaluation Performed:  Follow-Up Visit  Patient Location:  [address] [place] [address]  Provider location:   [address] [place], [place] [address]  PCP:  Maurice Small, MD  Cardiologist:  [name], MD Electrophysiologist:  None   Chief Complaint:  Manage dyslipidemia  History of Present Illness:    Carolyn Fleming is a 70 y.o. female who presents via audio/video conferencing for a telehealth visit today.  Ms. Arave returns today for follow-up.  She continues to do well on Praluent as she was intolerant to statins.  She says she can take atorvastatin for a little while but then get side effects.  She was able to get a health well grant which generally covers the cost of her medication however if she is running short she says she will try to go back on atorvastatin for little while.  I have encouraged her to reapply for the health well grant again this year.  Lipids are again very well treated.  Total cholesterol now 120, triglycerides 140, HDL 39 and LDL 57, down from 72 about a year ago.  The patient does not have symptoms concerning for COVID-19 infection (fever, chills,  cough, or new SHORTNESS OF BREATH).    Prior CV studies:   The following studies were reviewed today:  Chart reviewed, lab work  PMHx:  Past Medical History:  Diagnosis Date   Arthritis    Asthma    Breast cancer ([place])    Right breast    GERD (gastroesophageal reflux disease)    Glaucoma    Heart disease    Hyperlipidemia    Hypertension 01/25/2012   Obesity (BMI 35.0-39.9 without comorbidity) 01/25/2012   S/P CABG x 2 01/24/2012   Emergency LIMA to LAD, SVG to OM1, EVH via right thigh   Sleep apnea    uses cpap   Thyroid disease     Past Surgical History:  Procedure Laterality Date   ABDOMINAL HYSTERECTOMY     BREAST LUMPECTOMY  1997   breast cancer   CARDIAC CATHETERIZATION N/A 05/07/2015   Procedure: Left Heart Cath and Cors/Grafts Angiography;  Surgeon: [name], MD;  Location: [facility];  Service: Cardiovascular;  Laterality: N/A;   CHOLECYSTECTOMY     CORONARY ARTERY BYPASS GRAFT  01/24/2012   Procedure: CORONARY ARTERY BYPASS GRAFTING (CABG);  Surgeon: [name], MD;  Location: [facility];  Service: Open Heart Surgery;  Laterality: N/A;  Coronary Artery bypass Graft times two utilizing the left internal mammary artery and the right greater saphenous vein harvested endoscopically,  Transesophageal Echocardiogram   LEFT HEART CATHETERIZATION WITH CORONARY ANGIOGRAM N/A 01/24/2012   Procedure: LEFT  HEART CATHETERIZATION WITH CORONARY ANGIOGRAM;  Surgeon: [name], MD;  Location: [facility] CATH [facility];  Service: Cardiovascular;  Laterality: N/A;   TONSILLECTOMY     TUBAL LIGATION      FAMHx:  Family History  Problem Relation Age of Onset   Cancer Mother        Colon   Diabetes Mother    Hypertension Mother    Diabetes Sister    Cancer Brother        Lung   Diabetes Brother    Hypertension Brother    Breast cancer Niece        75s    SOCHx:   reports that she has never smoked. She has never used smokeless tobacco. She reports that she does not  drink alcohol and does not use drugs.  ALLERGIES:  Allergies  Allergen Reactions   Augmentin [Amoxicillin-Pot Clavulanate]     Heart racing   Codeine Other (See Comments)    Lethargy   Iodine Other (See Comments)    Per allergy test   Latex     REACTION: hives   Statins Other (See Comments)    Atorvastatin, rosuvastatin   Sulfonamide Derivatives     REACTION: gi upset    MEDS:  Current Meds  Medication Sig   Alirocumab (PRALUENT) 150 MG/ML SOAJ Inject 1 Dose into the skin every 14 (fourteen) days.   amLODipine (NORVASC) 5 MG tablet TAKE 1 TABLET BY MOUTH EVERY DAY   aspirin 81 MG chewable tablet Aspir-81   Azelastine HCl 0.15 % SOLN azelastine 0.15 % (205.5 mcg) nasal spray  USE 1 SPRAY INTO EACH NOSTRIL TWICE A DAY   diazepam (VALIUM) 5 MG tablet Take 1 tablet (5 mg total) by mouth every 8 (eight) hours as needed (dizziness).   Dorzolamide HCl-Timolol Mal PF 2-0.5 % SOLN Place 2 % into the left eye in the morning and at bedtime.    hydrocortisone cream 1 % Apply 1 application topically as needed for itching.   hydrOXYzine (ATARAX/VISTARIL) 25 MG tablet TAKE 1 TABLET BY MOUTH AT BEDTIME AS NEEDED (INSOMNIA).   ketoconazole (NIZORAL) 2 % cream Apply 1 application topically as needed for irritation.   loratadine (CLARITIN) 10 MG tablet TAKE 1 TABLET BY MOUTH EVERY DAY   losartan-hydrochlorothiazide (HYZAAR) 50-12.5 MG tablet TAKE 1 TABLET BY MOUTH EVERY DAY   meclizine (ANTIVERT) 12.5 MG tablet Take 1 tablet (12.5 mg total) by mouth 3 (three) times daily as needed for dizziness.   NITROSTAT 0.4 MG SL tablet Place 1 tablet (0.4 mg total) under the tongue every 5 (five) minutes as needed for chest pain.   ondansetron (ZOFRAN) 8 MG tablet TAKE 1 TABLET BY MOUTH TWICE A DAY AS NEEDED FOR NAUSEA   pantoprazole (PROTONIX) 40 MG tablet Take 1 tablet (40 mg total) by mouth daily.   Travoprost, BAK Free, (TRAVATAN) 0.004 % SOLN ophthalmic solution Place 1 drop into the left eye.       ROS: Pertinent items noted in HPI and remainder of comprehensive ROS otherwise negative.  Labs/Other Tests and Data Reviewed:    Recent Labs: 07/21/2020: TSH 3.07   Recent Lipid Panel Lab Results  Component Value Date/Time   CHOL 120 03/26/2021 08:25 AM   TRIG 140 03/26/2021 08:25 AM   HDL 39 (L) 03/26/2021 08:25 AM   CHOLHDL 3.1 03/26/2021 08:25 AM   CHOLHDL 4.4 12/12/2015 08:28 AM   LDLCALC 57 03/26/2021 08:25 AM   LDLDIRECT 149.1 05/28/2013 09:46 AM  Wt Readings from Last 3 Encounters:  04/02/21 189 lb (85.7 kg)  05/30/20 192 lb 3.2 oz (87.2 kg)  04/03/20 193 lb 9.6 oz (87.8 kg)     Exam:    Vital Signs:  Wt 189 lb (85.7 kg)   BMI 34.57 kg/m    General appearance: alert and no distress Lungs: No visual respiratory difficulty Abdomen: Obese Extremities: extremities normal, atraumatic, no cyanosis or edema Skin: Skin color, texture, turgor normal. No rashes or lesions Neurologic: Grossly normal Pleasant  ASSESSMENT & PLAN:    Mixed dyslipidemia, goal LDL<70 Statin intolerance Coronary artery disease status post two-vessel CABG (2013) PAD Hypertension ?  Hypothyroidism  Mrs. Medlock is doing well on Praluent.  She continues to have very low cholesterol.  She has no significant side effects associated with this.  Hopefully this will slow the progression of her disease.  I encouraged her to reapply for the health well grant this year.  Plan follow-up with me with repeat lipids in 1 year.  COVID-19 Education: The signs and symptoms of COVID-19 were discussed with the patient and how to seek care for testing (follow up with PCP or arrange E-visit).  The importance of social distancing was discussed today.  Patient Risk:   After full review of this patients clinical status, I feel that they are at least moderate risk at this time.  Time:   Today, I have spent 15 minutes with the patient with telehealth technology discussing dyslipidemia, statin intolerance.      Medication Adjustments/Labs and Tests Ordered: Current medicines are reviewed at length with the patient today.  Concerns regarding medicines are outlined above.   Tests Ordered: Orders Placed This Encounter  Procedures   Lipid panel    Medication Changes: No orders of the defined types were placed in this encounter.   Disposition:  in 1 year(s)  Pixie Casino, MD, [facility], [place] Director of the Advanced Lipid Disorders &  Cardiovascular Risk Reduction Clinic Diplomate of the American Board of Clinical Lipidology Attending Cardiologist  Direct Dial: [phone]  Fax: [phone]  Website:  [email or web address]  Pixie Casino, MD  04/02/2021 8:20 AM

## 2021-04-06 ENCOUNTER — Encounter: Payer: Self-pay | Admitting: Internal Medicine

## 2021-06-11 DIAGNOSIS — H401113 Primary open-angle glaucoma, right eye, severe stage: Secondary | ICD-10-CM | POA: Diagnosis not present

## 2021-06-11 DIAGNOSIS — H401122 Primary open-angle glaucoma, left eye, moderate stage: Secondary | ICD-10-CM | POA: Diagnosis not present

## 2021-06-19 ENCOUNTER — Other Ambulatory Visit: Payer: Self-pay | Admitting: Interventional Cardiology

## 2021-07-07 ENCOUNTER — Other Ambulatory Visit: Payer: Self-pay | Admitting: Family Medicine

## 2021-07-07 DIAGNOSIS — K219 Gastro-esophageal reflux disease without esophagitis: Secondary | ICD-10-CM

## 2021-07-07 DIAGNOSIS — Z20822 Contact with and (suspected) exposure to covid-19: Secondary | ICD-10-CM | POA: Diagnosis not present

## 2021-07-07 DIAGNOSIS — R051 Acute cough: Secondary | ICD-10-CM | POA: Diagnosis not present

## 2021-07-22 ENCOUNTER — Other Ambulatory Visit: Payer: Self-pay | Admitting: Interventional Cardiology

## 2021-08-03 DIAGNOSIS — G4733 Obstructive sleep apnea (adult) (pediatric): Secondary | ICD-10-CM | POA: Diagnosis not present

## 2021-08-05 ENCOUNTER — Telehealth: Payer: Self-pay | Admitting: Internal Medicine

## 2021-08-05 NOTE — Telephone Encounter (Signed)
Patient is approved for co-pay grant from [place] from 07/23/21 - 07/22/22 for $2500 Healthwell ID: [id number] Fund: hypercholesterolemia - medicare access  Pharmacy card ID: [id number] PC Group: [id number] PC BIN: [id number] PC PCN: [id number] PC Processor: PDMI

## 2021-08-06 ENCOUNTER — Telehealth: Payer: Self-pay | Admitting: Internal Medicine

## 2021-08-06 ENCOUNTER — Encounter: Payer: Self-pay | Admitting: *Deleted

## 2021-08-06 ENCOUNTER — Other Ambulatory Visit: Payer: Self-pay

## 2021-08-06 MED ORDER — AMLODIPINE BESYLATE 5 MG PO TABS: 5.0000 mg | ORAL_TABLET | Freq: Every day | ORAL | 0 refills | Status: DC

## 2021-08-06 MED ORDER — LOSARTAN POTASSIUM-HCTZ 100-12.5 MG PO TABS: 1.0000 | ORAL_TABLET | Freq: Every day | ORAL | 3 refills | Status: DC

## 2021-08-06 NOTE — Telephone Encounter (Signed)
For future refills of Amlodipine pt is requesting that medication be sent to:  [place], [place]: [phone] F: [phone] Address  [place] [address]  Store number: [phone]

## 2021-08-06 NOTE — Telephone Encounter (Signed)
Pt c/o medication issue:  1. Name of Medication: amLODipine (NORVASC) 5 MG tablet  2. How are you currently taking this medication (dosage and times per day)? Take 1 tablet (5 mg total) by mouth daily. Please keep upcoming appt in January 2023 with Dr. [name] before anymore refills. Thank you   3. Are you having a reaction (difficulty breathing--STAT)?  No  4. What is your medication issue?  She itches really bad, hot flashes, and heart race. Patient states she can take the medicationn

## 2021-08-06 NOTE — Telephone Encounter (Signed)
I spoke with patient and gave her information from Dr [name].  Prescription sent to [facility] on [address].  Patient will stop amlodipine.  She will check BP at home and let us know if it is running higher than normal.

## 2021-08-06 NOTE — Telephone Encounter (Signed)
I spoke with patient. She has been on amlodipine for several years but reports she had problems in the past when pharmacy filled with tablets from different companies. She picked up new Amlodipine prescription from [facility] recently.  She took the first tablet with no issues but the second tablet caused her to have itching, hot flashes and heart racing.  These are the same symptoms she had in the past when manufacturer was changed.  Patient is feeling fine at this time.  I asked her to reach out to [facility] to see if they could fill from a different manufacturer. Will forward to Dr [name] to see if medication change needed.  Patient does not feel she can take amlodipine if she continues to have these symptoms.

## 2021-08-06 NOTE — Telephone Encounter (Signed)
° °  Pt is calling back, she said she spoke with [facility] and they refuse to take her meds back, and she said if she get a new prescription she have to pay for it again. She is having a lot of reactions from it, she will just let Dr, [name] decide if he wants to change her meds

## 2021-08-06 NOTE — Telephone Encounter (Signed)
I spoke with patient.  She took amlodipine today and it caused her to have acid reflux.  States the same thing happened yesterday.  She is concerned she will have palpitations, hot flashes and itching later.  She has checked with [facility] and they can get amlodipine from a different manufacturer but patient no longer wants to take amlodipine.   She is asking if she can be changed to a different medication

## 2021-08-06 NOTE — Telephone Encounter (Signed)
Left message to call office

## 2021-08-06 NOTE — Telephone Encounter (Signed)
Patient calling back. She says she does not want to be on the amlodipine, but a refill was sent in for it. She says she spoke with the office this morning and thought she explained that. She says she does not want any refills for the medication.

## 2021-08-06 NOTE — Telephone Encounter (Signed)
[name], MD  You; Cv Div [facility] Triage 2 minutes ago (3:01 PM)   Change losartan HCT to 100/12.5 mg daily to better cover blood pressure.  If blood pressure remains high, an additional add on will be needed or the losartan HCTZ can be increased to 100/25 mg/day

## 2021-08-13 DIAGNOSIS — Z6835 Body mass index (BMI) 35.0-35.9, adult: Secondary | ICD-10-CM | POA: Diagnosis not present

## 2021-08-13 DIAGNOSIS — H409 Unspecified glaucoma: Secondary | ICD-10-CM | POA: Insufficient documentation

## 2021-08-13 DIAGNOSIS — Z01419 Encounter for gynecological examination (general) (routine) without abnormal findings: Secondary | ICD-10-CM | POA: Diagnosis not present

## 2021-08-13 DIAGNOSIS — E782 Mixed hyperlipidemia: Secondary | ICD-10-CM | POA: Insufficient documentation

## 2021-08-20 NOTE — Progress Notes (Signed)
Cardiology Office Note:    Date:  08/21/2021   ID:  Carolyn Fleming, DOB August 11, 1951, MRN [id number]  PCP:  [name], No  Cardiologist:  [name], MD   Referring MD: Billie Ruddy, MD   Chief Complaint  Patient presents with   Coronary Artery Disease   Hypertension    History of Present Illness:    Carolyn Fleming is a 71 y.o. female with a hx of  CAD, prior anterior non-ST elevation MI, coronary bypass grafting with LIMA to LAD, SVG to OM 2013, hypertension, abnormal right LE doppler waveforms, hyperlipidemia intolerant of statins now on PCSK-9 (referred to [facility], MD 05/2019), and and BMI 35.   She had an issue with generic metoprolol earlier this year.  It was associated with tachycardia.  We discontinued that and increased losartan HCTZ to 100/12.5 mg/day.  Blood pressure still mildly elevated as noted below.  Denies angina, orthopnea, PND, lower extremity swelling, syncope, and need for sublingual nitroglycerin.  Past Medical History:  Diagnosis Date   Arthritis    Asthma    Breast cancer ([place])    Right breast    GERD (gastroesophageal reflux disease)    Glaucoma    Heart disease    Hyperlipidemia    Hypertension 01/25/2012   Obesity (BMI 35.0-39.9 without comorbidity) 01/25/2012   S/P CABG x 2 01/24/2012   Emergency LIMA to LAD, SVG to OM1, EVH via right thigh   Sleep apnea    uses cpap   Thyroid disease     Past Surgical History:  Procedure Laterality Date   ABDOMINAL HYSTERECTOMY     BREAST LUMPECTOMY  1997   breast cancer   CARDIAC CATHETERIZATION N/A 05/07/2015   Procedure: Left Heart Cath and Cors/Grafts Angiography;  Surgeon: [name], MD;  Location: [facility];  Service: Cardiovascular;  Laterality: N/A;   CHOLECYSTECTOMY     CORONARY ARTERY BYPASS GRAFT  01/24/2012   Procedure: CORONARY ARTERY BYPASS GRAFTING (CABG);  Surgeon: [name], MD;  Location: [facility];  Service: Open Heart Surgery;  Laterality: N/A;  Coronary Artery bypass  Graft times two utilizing the left internal mammary artery and the right greater saphenous vein harvested endoscopically,  Transesophageal Echocardiogram   LEFT HEART CATHETERIZATION WITH CORONARY ANGIOGRAM N/A 01/24/2012   Procedure: LEFT HEART CATHETERIZATION WITH CORONARY ANGIOGRAM;  Surgeon: [name], MD;  Location: [facility] CATH LAB;  Service: Cardiovascular;  Laterality: N/A;   TONSILLECTOMY     TUBAL LIGATION      Current Medications: Current Meds  Medication Sig   Alirocumab (PRALUENT) 150 MG/ML SOAJ Inject 1 Dose into the skin every 14 (fourteen) days.   aspirin 81 MG chewable tablet Aspir-81   Azelastine HCl 0.15 % SOLN azelastine 0.15 % (205.5 mcg) nasal spray  USE 1 SPRAY INTO EACH NOSTRIL TWICE A DAY   diazepam (VALIUM) 5 MG tablet Take 1 tablet (5 mg total) by mouth every 8 (eight) hours as needed (dizziness).   Dorzolamide HCl-Timolol Mal PF 2-0.5 % SOLN Place 2 % into the left eye in the morning and at bedtime.    hydrocortisone cream 1 % Apply 1 application topically as needed for itching.   hydrOXYzine (ATARAX/VISTARIL) 25 MG tablet TAKE 1 TABLET BY MOUTH AT BEDTIME AS NEEDED (INSOMNIA).   ketoconazole (NIZORAL) 2 % cream Apply 1 application topically as needed for irritation.   loratadine (CLARITIN) 10 MG tablet TAKE 1 TABLET BY MOUTH EVERY DAY  losartan-hydrochlorothiazide (HYZAAR) 100-12.5 MG tablet Take 1 tablet by mouth daily.   meclizine (ANTIVERT) 12.5 MG tablet Take 1 tablet (12.5 mg total) by mouth 3 (three) times daily as needed for dizziness.   NITROSTAT 0.4 MG SL tablet Place 1 tablet (0.4 mg total) under the tongue every 5 (five) minutes as needed for chest pain.   ondansetron (ZOFRAN) 8 MG tablet TAKE 1 TABLET BY MOUTH TWICE A DAY AS NEEDED FOR NAUSEA   pantoprazole (PROTONIX) 40 MG tablet TAKE 1 TABLET BY MOUTH EVERY DAY   Travoprost, BAK Free, (TRAVATAN) 0.004 % SOLN ophthalmic solution Place 1 drop into the left eye.      Allergies:   Acetazolamide,  Amoxicillin er, Augmentin [amoxicillin-pot clavulanate], Codeine, Fluticasone, Iodine, Latex, Levofloxacin, Lisinopril, Meloxicam, Propofol, Statins, and Sulfonamide derivatives   Social History   Socioeconomic History   Marital status: Single    Spouse name: Not on file   Number of children: Not on file   Years of education: Not on file   Highest education level: Not on file  Occupational History   Not on file  Tobacco Use   Smoking status: Never   Smokeless tobacco: Never  Vaping Use   Vaping Use: Never used  Substance and Sexual Activity   Alcohol use: No   Drug use: No   Sexual activity: Never    Comment: 1st intercourse- 62,  partners- 2   Other Topics Concern   Not on file  Social History Narrative   Not on file   Social Determinants of Health   Financial Resource Strain: Not on file  Food Insecurity: Not on file  Transportation Needs: Not on file  Physical Activity: Not on file  Stress: Not on file  Social Connections: Not on file     Family History: The patient's family history includes Breast cancer in her niece; Cancer in her brother and mother; Diabetes in her brother, mother, and sister; Hypertension in her brother and mother.  ROS:   Please see the history of present illness.    A younger brother had an MI and multiple stents.  Older sister noted to have elevated blood pressure and stage IV CKD.  This was all news to her.  All other systems reviewed and are negative.  EKGs/Labs/Other Studies Reviewed:    The following studies were reviewed today: No new imaging data.  Should have an echocardiogram done as follow-up  EKG:  EKG normal sinus rhythm, incomplete right bundle, horizontal axis, nonspecific T wave abnormality, low voltage, and compared to the tracing of 2021, no changes noted.  Recent Labs: No results found for requested labs within last 8760 hours.  Recent Lipid Panel    Component Value Date/Time   CHOL 120 03/26/2021 0825   TRIG 140  03/26/2021 0825   HDL 39 (L) 03/26/2021 0825   CHOLHDL 3.1 03/26/2021 0825   CHOLHDL 4.4 12/12/2015 0828   VLDL 29 12/12/2015 0828   LDLCALC 57 03/26/2021 0825   LDLDIRECT 149.1 05/28/2013 0946    Physical Exam:    VS:  BP (!) 150/88    Pulse 79    Ht 5\' 2"  (1.575 m)    Wt 190 lb (86.2 kg)    SpO2 97%    BMI 34.75 kg/m     Wt Readings from Last 3 Encounters:  08/21/21 190 lb (86.2 kg)  04/02/21 189 lb (85.7 kg)  05/30/20 192 lb 3.2 oz (87.2 kg)     GEN: These. No acute distress  HEENT: Normal NECK: No JVD. LYMPHATICS: No lymphadenopathy CARDIAC: No murmur. RRR no gallop, or edema. VASCULAR:  Normal Pulses. No bruits. RESPIRATORY:  Clear to auscultation without rales, wheezing or rhonchi  ABDOMEN: Soft, non-tender, non-distended, No pulsatile mass, MUSCULOSKELETAL: No deformity  SKIN: Warm and dry NEUROLOGIC:  Alert and oriented x 3 PSYCHIATRIC:  Normal affect   ASSESSMENT:    1. CAD of autologous bypass graft   2. PAD (peripheral artery disease) ([place])   3. Essential hypertension   4. Hyperlipidemia, unspecified hyperlipidemia type   5. OSA on CPAP   6. Obesity (BMI 35.0-39.9 without comorbidity)    PLAN:    In order of problems listed above:  Stable without angina.  Endorses secondary prevention measures. Denies claudication.  Secondary prevention measures reviewed as noted above. In adequate control.  Start low-dose beta-blocker with metoprolol succinate 25 mg/day.  Amlodipine was taken away because she felt tachycardia associated with this use. Continue Praluent.  Excellent cholesterol lowering.  Comanaged with Dr. Debara Pickett. She is compliant with CPAP. Needs to lose weight.  Target BP: <130/80 mmHg  Diet and lifestyle measures for BP control were reviewed in detail: Low sodium diet (<2.5 gm daily); alcohol restriction (<3 ounces per day); weight loss (Mediterranean); avoid non-steroidal agents; > 6 hours sleep per day; 150 min moderate exercise per week. Medical  regimen will include at least 2 agents. Resistant hypertension if not controlled on 3 agents. Consider further evaluation: Sleep study to r/o OSA; Renal angiogram; Primary hyperaldonism and Pheochromocytoma w/u. After 3 agents, consider MRA (spironolactone)/ Epleronone), hydralazine, beta-blocker, and Minoxidil if not already in use due to patient profile.    Medication Adjustments/Labs and Tests Ordered: Current medicines are reviewed at length with the patient today.  Concerns regarding medicines are outlined above.  No orders of the defined types were placed in this encounter.  No orders of the defined types were placed in this encounter.      Signed, [name], MD  08/21/2021 8:53 AM    [place]

## 2021-08-21 ENCOUNTER — Other Ambulatory Visit: Payer: Self-pay

## 2021-08-21 ENCOUNTER — Encounter: Payer: Self-pay | Admitting: Interventional Cardiology

## 2021-08-21 ENCOUNTER — Ambulatory Visit (INDEPENDENT_AMBULATORY_CARE_PROVIDER_SITE_OTHER): Payer: Medicare Other | Admitting: Interventional Cardiology

## 2021-08-21 VITALS — BP 150/88 | HR 79 | Ht 62.0 in | Wt 190.0 lb

## 2021-08-21 DIAGNOSIS — I25810 Atherosclerosis of coronary artery bypass graft(s) without angina pectoris: Secondary | ICD-10-CM

## 2021-08-21 DIAGNOSIS — I739 Peripheral vascular disease, unspecified: Secondary | ICD-10-CM | POA: Diagnosis not present

## 2021-08-21 DIAGNOSIS — G4733 Obstructive sleep apnea (adult) (pediatric): Secondary | ICD-10-CM

## 2021-08-21 DIAGNOSIS — I10 Essential (primary) hypertension: Secondary | ICD-10-CM

## 2021-08-21 DIAGNOSIS — E785 Hyperlipidemia, unspecified: Secondary | ICD-10-CM

## 2021-08-21 DIAGNOSIS — E669 Obesity, unspecified: Secondary | ICD-10-CM | POA: Diagnosis not present

## 2021-08-21 DIAGNOSIS — Z9989 Dependence on other enabling machines and devices: Secondary | ICD-10-CM | POA: Diagnosis not present

## 2021-08-21 LAB — BASIC METABOLIC PANEL
BUN/Creatinine Ratio: 11 — ABNORMAL LOW (ref 12–28)
BUN: 14 mg/dL (ref 8–27)
CO2: 25 mmol/L (ref 20–29)
Calcium: 9.4 mg/dL (ref 8.7–10.3)
Chloride: 105 mmol/L (ref 96–106)
Creatinine, Ser: 1.32 mg/dL — ABNORMAL HIGH (ref 0.57–1.00)
Glucose: 147 mg/dL — ABNORMAL HIGH (ref 70–99)
Potassium: 4.4 mmol/L (ref 3.5–5.2)
Sodium: 141 mmol/L (ref 134–144)
eGFR: 43 mL/min/{1.73_m2} — ABNORMAL LOW (ref >59)

## 2021-08-21 MED ORDER — METOPROLOL SUCCINATE ER 25 MG PO TB24: 25.0000 mg | ORAL_TABLET | Freq: Every day | ORAL | 3 refills | Status: DC

## 2021-08-21 NOTE — Patient Instructions (Addendum)
Medication Instructions:  START Toprol XL 25 mg by mouth once daily. *If you need a refill on your cardiac medications before your next appointment, please call your pharmacy*  BMET TODAY  Testing/Procedures: Your physician has requested that you have an echocardiogram within the next 12 months. Echocardiography is a painless test that uses sound waves to create images of your heart. It provides your doctor with information about the size and shape of your heart and how well your hearts chambers and valves are working. This procedure takes approximately one hour. There are no restrictions for this procedure.    Follow-Up: At [facility], you and your health needs are our priority.  As part of our continuing mission to provide you with exceptional heart care, we have created designated Provider Care Teams.  These Care Teams include your primary Cardiologist (physician) and Advanced Practice Providers (APPs -  Physician Assistants and Nurse Practitioners) who all work together to provide you with the care you need, when you need it.  We recommend signing up for the patient portal called "MyChart".  Sign up information is provided on this After Visit Summary.  MyChart is used to connect with patients for Virtual Visits (Telemedicine).  Patients are able to view lab/test results, encounter notes, upcoming appointments, etc.  Non-urgent messages can be sent to your provider as well.   To learn more about what you can do with MyChart, go to [email or web address].    Your next appointment:   1 year(s)  The format for your next appointment:   In Person  Provider:   [name], MD     [place] refers to food and lifestyle choices that are based on the traditions of countries located on the [place]. It focuses on eating more fruits, vegetables, whole grains, beans, nuts, seeds, and heart-healthy fats, and eating less dairy, meat, eggs, and  processed foods with added sugar, salt, and fat. This way of eating has been shown to help prevent certain conditions and improve outcomes for people who have chronic diseases, like kidney disease and heart disease. What are tips for following this plan? Reading food labels Check the serving size of packaged foods. For foods such as rice and pasta, the serving size refers to the amount of cooked product, not dry. Check the total fat in packaged foods. Avoid foods that have saturated fat or trans fats. Check the ingredient list for added sugars, such as corn syrup. Shopping  Buy a variety of foods that offer a balanced diet, including: Fresh fruits and vegetables (produce). Grains, beans, nuts, and seeds. Some of these may be available in unpackaged forms or large amounts (in bulk). Fresh seafood. Poultry and eggs. Low-fat dairy products. Buy whole ingredients instead of prepackaged foods. Buy fresh fruits and vegetables in-season from local farmers markets. Buy plain frozen fruits and vegetables. If you do not have access to quality fresh seafood, buy precooked frozen shrimp or canned fish, such as tuna, salmon, or sardines. Stock your pantry so you always have certain foods on hand, such as olive oil, canned tuna, canned tomatoes, rice, pasta, and beans. Cooking Cook foods with extra-virgin olive oil instead of using butter or other vegetable oils. Have meat as a side dish, and have vegetables or grains as your main dish. This means having meat in small portions or adding small amounts of meat to foods like pasta or stew. Use beans or vegetables instead of meat in common dishes like chili  or lasagna. Experiment with different cooking methods. Try roasting, broiling, steaming, and sauting vegetables. Add frozen vegetables to soups, stews, pasta, or rice. Add nuts or seeds for added healthy fats and plant protein at each meal. You can add these to yogurt, salads, or vegetable  dishes. Marinate fish or vegetables using olive oil, lemon juice, garlic, and fresh herbs. Meal planning Plan to eat one vegetarian meal one day each week. Try to work up to two vegetarian meals, if possible. Eat seafood two or more times a week. Have healthy snacks readily available, such as: Vegetable sticks with hummus. Greek yogurt. Fruit and nut trail mix. Eat balanced meals throughout the week. This includes: Fruit: 2-3 servings a day. Vegetables: 4-5 servings a day. Low-fat dairy: 2 servings a day. Fish, poultry, or lean meat: 1 serving a day. Beans and legumes: 2 or more servings a week. Nuts and seeds: 1-2 servings a day. Whole grains: 6-8 servings a day. Extra-virgin olive oil: 3-4 servings a day. Limit red meat and sweets to only a few servings a month. Lifestyle  Cook and eat meals together with your family, when possible. Drink enough fluid to keep your urine pale yellow. Be physically active every day. This includes: Aerobic exercise like running or swimming. Leisure activities like gardening, walking, or housework. Get 7-8 hours of sleep each night. If recommended by your health care provider, drink red wine in moderation. This means 1 glass a day for nonpregnant women and 2 glasses a day for men. A glass of wine equals 5 oz (150 mL). What foods should I eat? Fruits Apples. Apricots. Avocado. Berries. Bananas. Cherries. Dates. Figs. Grapes. Lemons. Melon. Oranges. Peaches. Plums. Pomegranate. Vegetables Artichokes. Beets. Broccoli. Cabbage. Carrots. Eggplant. Green beans. Chard. Kale. Spinach. Onions. Leeks. Peas. Squash. Tomatoes. Peppers. Radishes. Grains Whole-grain pasta. Brown rice. Bulgur wheat. Polenta. Couscous. Whole-wheat bread. [place]. Meats and other proteins Beans. Almonds. Sunflower seeds. Pine nuts. Peanuts. [place]. Salmon. Scallops. Shrimp. [place]. Tilapia. Clams. Oysters. Eggs. Poultry without skin. Dairy Low-fat milk. Cheese. Greek  yogurt. Fats and oils Extra-virgin olive oil. Avocado oil. Grapeseed oil. Beverages Water. Red wine. Herbal tea. Sweets and desserts Greek yogurt with honey. Baked apples. Poached pears. Trail mix. Seasonings and condiments Basil. Cilantro. Coriander. Cumin. Mint. Parsley. Sage. Rosemary. Tarragon. Garlic. Oregano. Thyme. Pepper. Balsamic vinegar. Tahini. Hummus. Tomato sauce. Olives. Mushrooms. The items listed above may not be a complete list of foods and beverages you can eat. Contact a dietitian for more information. What foods should I limit? This is a list of foods that should be eaten rarely or only on special occasions. Fruits Fruit canned in syrup. Vegetables Deep-fried potatoes (french fries). Grains Prepackaged pasta or rice dishes. Prepackaged cereal with added sugar. Prepackaged snacks with added sugar. Meats and other proteins Beef. Pork. Lamb. Poultry with skin. Hot dogs. Berniece Salines. Dairy Ice cream. Sour cream. Whole milk. Fats and oils Butter. Canola oil. Vegetable oil. Beef fat (tallow). Lard. Beverages Juice. Sugar-sweetened soft drinks. Beer. Liquor and spirits. Sweets and desserts Cookies. Cakes. Pies. Candy. Seasonings and condiments Mayonnaise. Pre-made sauces and marinades. The items listed above may not be a complete list of foods and beverages you should limit. Contact a dietitian for more information. Summary The Mediterranean diet includes both food and lifestyle choices. Eat a variety of fresh fruits and vegetables, beans, nuts, seeds, and whole grains. Limit the amount of red meat and sweets that you eat. If recommended by your health care provider, drink red wine in moderation.  This means 1 glass a day for nonpregnant women and 2 glasses a day for men. A glass of wine equals 5 oz (150 mL). This information is not intended to replace advice given to you by your health care provider. Make sure you discuss any questions you have with your health care  provider. Document Revised: 09/07/2019 Document Reviewed: 07/05/2019 Elsevier Patient Education  2022 [employer].

## 2021-08-24 ENCOUNTER — Encounter: Payer: Self-pay | Admitting: *Deleted

## 2021-09-04 DIAGNOSIS — H401122 Primary open-angle glaucoma, left eye, moderate stage: Secondary | ICD-10-CM | POA: Diagnosis not present

## 2021-09-04 DIAGNOSIS — H401113 Primary open-angle glaucoma, right eye, severe stage: Secondary | ICD-10-CM | POA: Diagnosis not present

## 2021-09-07 ENCOUNTER — Other Ambulatory Visit: Payer: Self-pay | Admitting: Interventional Cardiology

## 2021-09-08 ENCOUNTER — Telehealth: Payer: Self-pay | Admitting: Interventional Cardiology

## 2021-09-08 NOTE — Telephone Encounter (Signed)
Patient called in stating she would like to switch back to another BP medication, but declined discussing further with me. She states I am only relaying a message and refused to go into detail. Patient expressed she would only like to speak with Anderson Malta B. regarding this matter. She states she does not want to speak with any other nurse ans she has had issues with relaying information to other nurses in the past. I did inforn her, per Liborio Nixon, Dr. [name] is not in this week.Please return call as able.

## 2021-09-08 NOTE — Telephone Encounter (Signed)
Metoprolol Succinate 25mg  QD was added to pt's BP regimen on 08/21/21 due to HTN at OV.  Pt states she is not tolerating this medication.  She's not sleeping well and what little sleep she does get, she has night terrors.  Also causing some visual disturbances.  Seen eye doctor last week and they did a visual field test and she said she couldn't see well.  Recently had some eye surgery on her right eye.  Pt had to stop Amlodipine recently because her pharmacy had changed manufacturers and she wasn't tolerating the new one.  [facility] [address] [facility] manufacturer, [facility].  Pt would like to go back on Amlodipine 5mg  QD and Losartan/HCTZ 50/12.5mg  QD.  States she tolerated these two meds well together and BP was always good with these.  Advised I will send message to Dr. [name] for review and see if ok to switch medications.

## 2021-09-10 ENCOUNTER — Other Ambulatory Visit: Payer: Self-pay | Admitting: Interventional Cardiology

## 2021-09-10 NOTE — Telephone Encounter (Signed)
Called pt and left message letting her know that I have not heard back from Dr. [name] yet in regards to her meds.  Advised I will call as soon as I hear from him.

## 2021-09-10 NOTE — Telephone Encounter (Signed)
Patient is calling back upset. She takes that two medication was supposed to be called in for her but only one was. Please advise

## 2021-09-11 NOTE — Telephone Encounter (Signed)
Went to send in pt's medications, Amlodipine 5 mg qd and Losartan/HCTZ 50/12.5 mg and they had already been sent in.

## 2021-09-14 DIAGNOSIS — U071 COVID-19: Secondary | ICD-10-CM | POA: Diagnosis not present

## 2021-10-19 DIAGNOSIS — H401113 Primary open-angle glaucoma, right eye, severe stage: Secondary | ICD-10-CM | POA: Diagnosis not present

## 2021-10-19 DIAGNOSIS — H401122 Primary open-angle glaucoma, left eye, moderate stage: Secondary | ICD-10-CM | POA: Diagnosis not present

## 2021-10-20 DIAGNOSIS — U071 COVID-19: Secondary | ICD-10-CM | POA: Diagnosis not present

## 2021-10-23 ENCOUNTER — Other Ambulatory Visit: Payer: Self-pay

## 2021-10-23 ENCOUNTER — Telehealth: Payer: Self-pay | Admitting: Interventional Cardiology

## 2021-10-23 MED ORDER — PRALUENT 150 MG/ML ~~LOC~~ SOAJ: 1.0000 | SUBCUTANEOUS | 6 refills | Status: DC

## 2021-10-23 NOTE — Telephone Encounter (Signed)
?*  STAT* If patient is at the pharmacy, call can be transferred to refill team. ? ? ?1. Which medications need to be refilled? (please list name of each medication and dose if known) Alirocumab (PRALUENT) 150 MG/ML SOAJ ? ?2. Which pharmacy/location (including street and city if local pharmacy) is medication to be sent to?[facility] [place] [place] ? ?3. Do they need a 30 day or 90 day supply? 44m  ? ? ?

## 2021-10-23 NOTE — Telephone Encounter (Signed)
Refill was already sent in today. ?

## 2021-11-01 IMAGING — US US BREAST*R* LIMITED INC AXILLA
1 series · 5 of 5 positions shown · non-contrast
Comparison: Previous exam(s).

CLINICAL DATA: History of recent recurrent RIGHT breast mastitis.
Patient states that the mastitis is now resolved status post most
recent course of antibiotics. Remote history of RIGHT breast cancer
status post lumpectomy and chemotherapy.
TECHNIQUE: Right digital diagnostic mammography and breast tomosynthesis was
performed. The images were evaluated with computer-aided detection.;
Targeted ultrasound examination of the right breast was performed

[Series 1: us breast*right* limited inc axilla · 0.08mm/px · 5 of 5 slices shown]
[im 1/5]
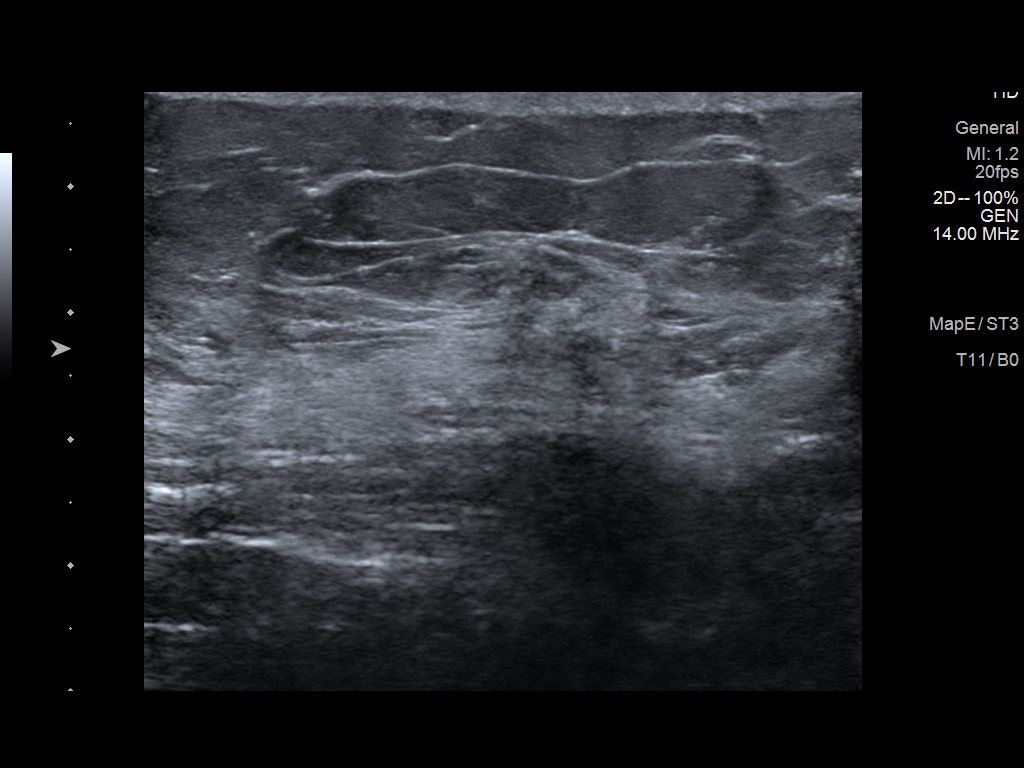
[im 2/5]
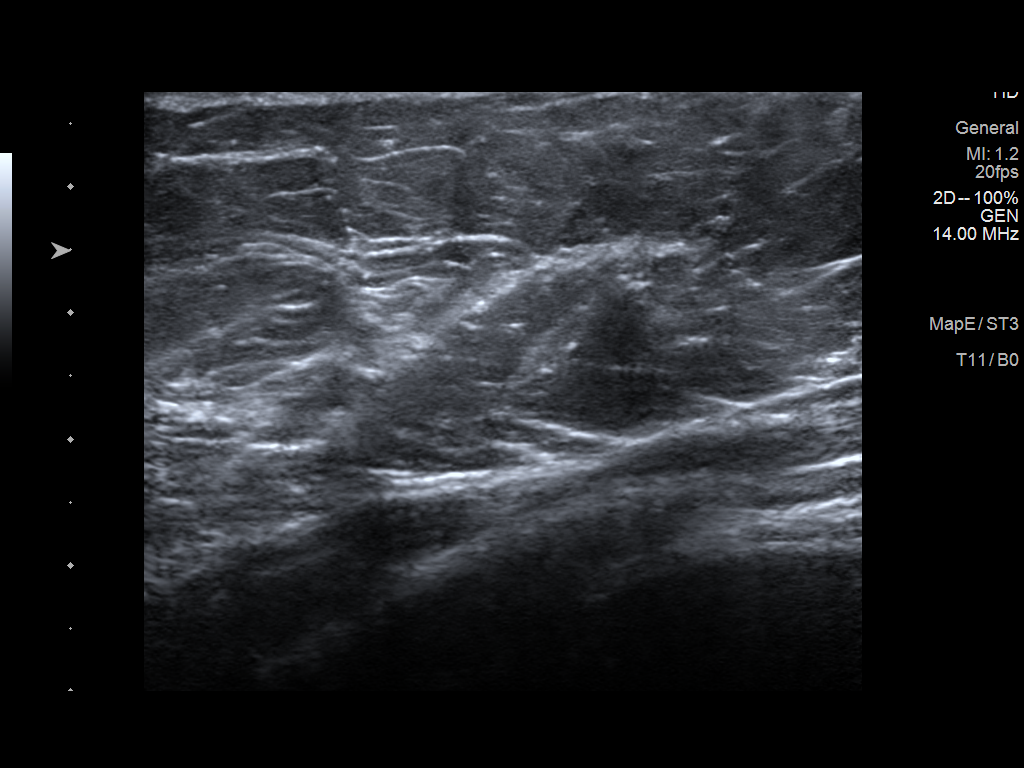
[im 3/5]
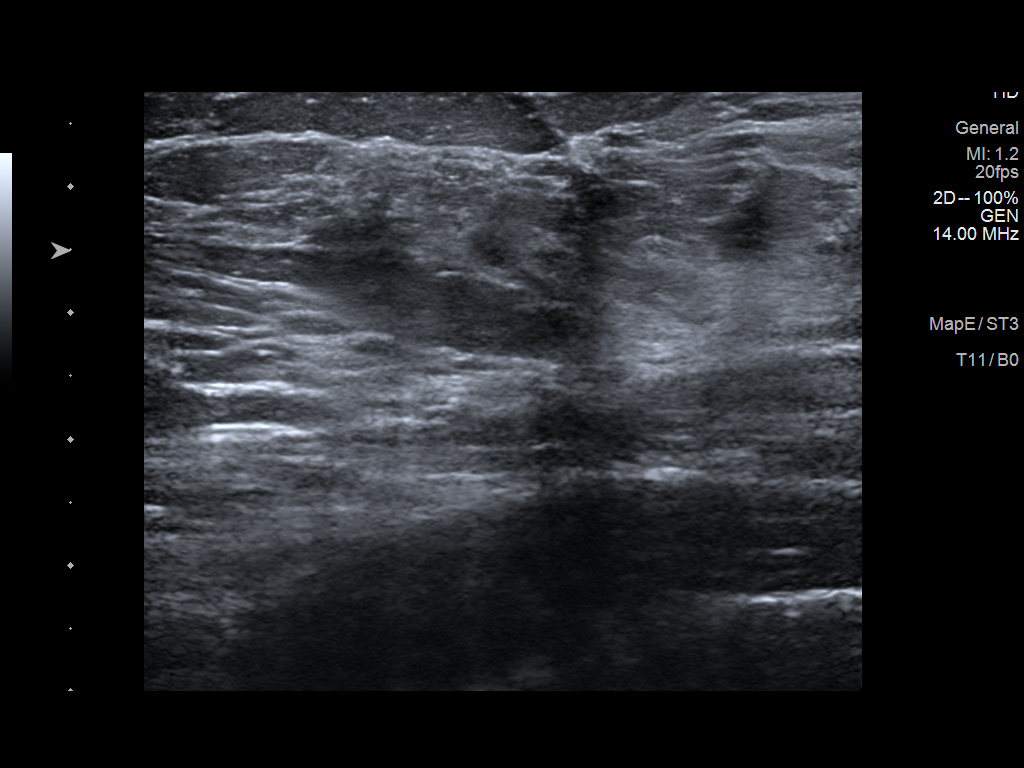
[im 4/5]
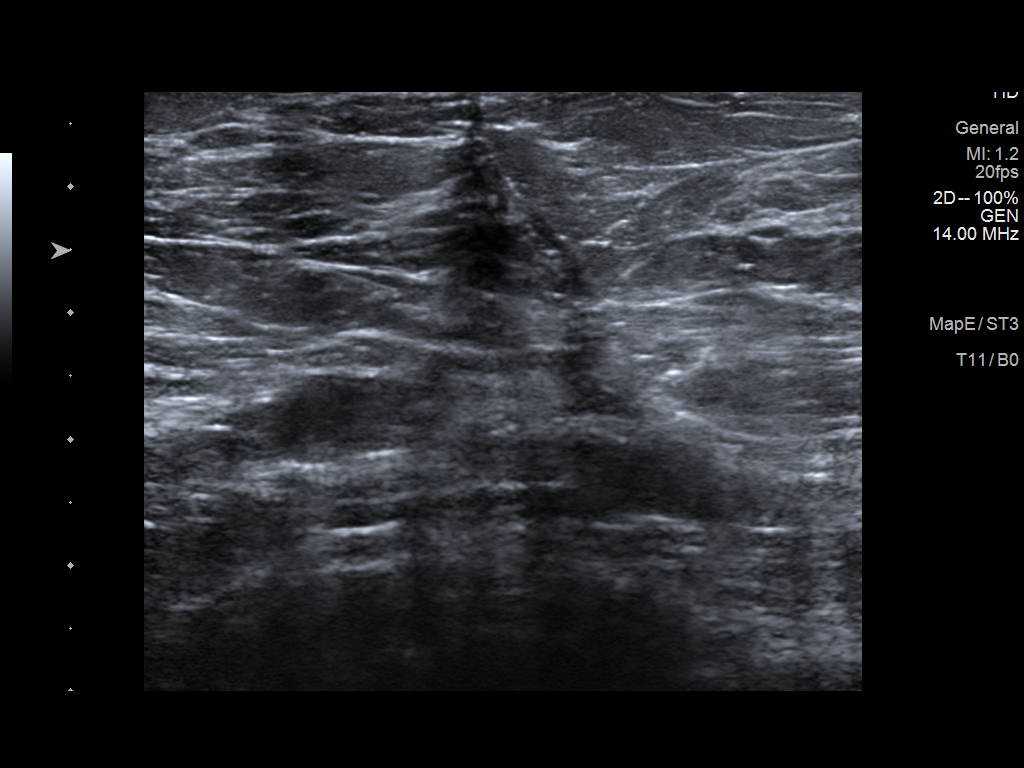
[im 5/5]
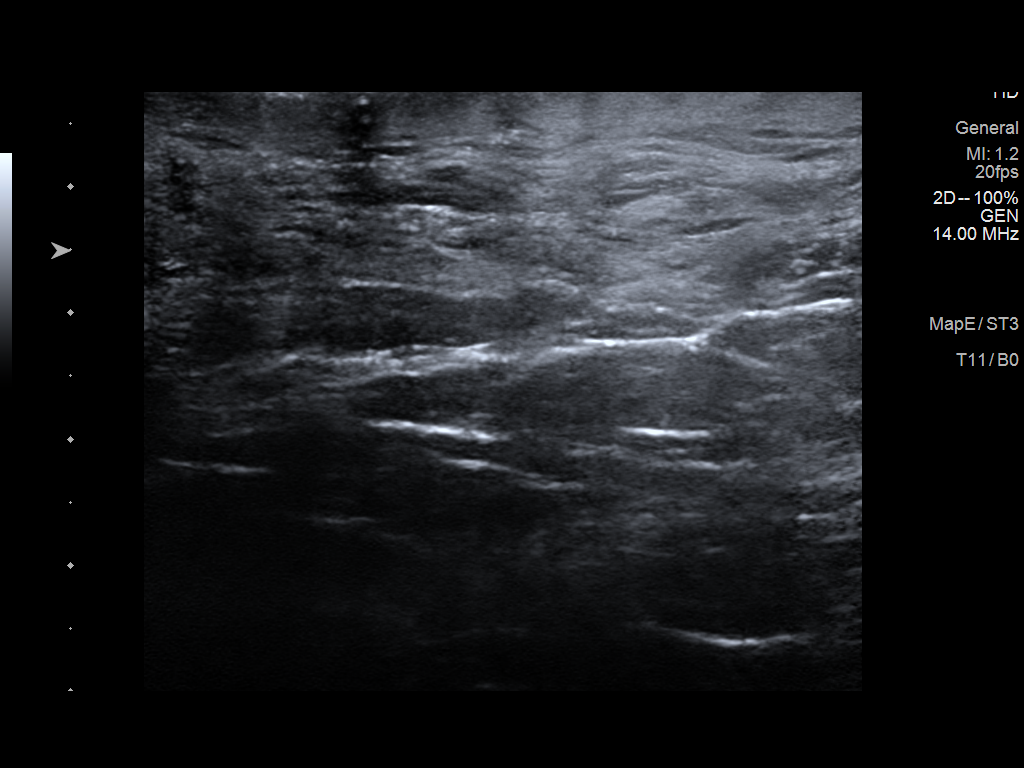

[5 of 5 positions shown; findings below may reference images not displayed]

Ordering physician's request describes a RIGHT breast mass at the 4
o'clock axis, 1 cm from the nipple. Patient believes the palpable
lump is more peripherally located within the outer RIGHT breast.

EXAM:
DIGITAL DIAGNOSTIC UNILATERAL RIGHT MAMMOGRAM WITH TOMOSYNTHESIS AND
CAD; ULTRASOUND RIGHT BREAST LIMITED
ACR Breast Density Category c: The breast tissue is heterogeneously
dense, which may obscure small masses.
FINDINGS: RIGHT breast diagnostic mammogram: There are stable postsurgical
changes within the upper RIGHT breast. There are no new dominant
masses, suspicious calcifications or secondary signs of malignancy
within the RIGHT breast

Targeted ultrasound is performed, evaluating the outer RIGHT breast
as directed by the ordering physician's request, showing only normal
fibroglandular tissues and fat lobules throughout. No solid or
cystic mass. More peripherally within the outer RIGHT breast, there
is expected postsurgical scarring. No suspicious solid or cystic
mass.

There is skin thickening at the RIGHT nipple and periareolar RIGHT
breast. Patient has a cardiac history and sleeps primarily on her
RIGHT side, explaining the chronic asymmetric skin thickening/edema.
IMPRESSION: No evidence of malignancy within the RIGHT breast. Stable
chronic/postsurgical changes.

RECOMMENDATION:
1. Annual screening mammograms.
2. Patient was encouraged to return for additional imaging if the
RIGHT breast mastitis returned.

I have discussed the findings and recommendations with the patient.
If applicable, a reminder letter will be sent to the patient
regarding the next appointment.

BI-RADS CATEGORY  2: Benign.

## 2021-11-01 IMAGING — MG MM DIGITAL DIAGNOSTIC UNILAT*R* W/ TOMO W/ CAD
4 series · 4 of 12 positions shown · non-contrast
Comparison: Previous exam(s).

CLINICAL DATA: History of recent recurrent RIGHT breast mastitis.
Patient states that the mastitis is now resolved status post most
recent course of antibiotics. Remote history of RIGHT breast cancer
status post lumpectomy and chemotherapy.
TECHNIQUE: Right digital diagnostic mammography and breast tomosynthesis was
performed. The images were evaluated with computer-aided detection.;
Targeted ultrasound examination of the right breast was performed

[R CC synth-2D]
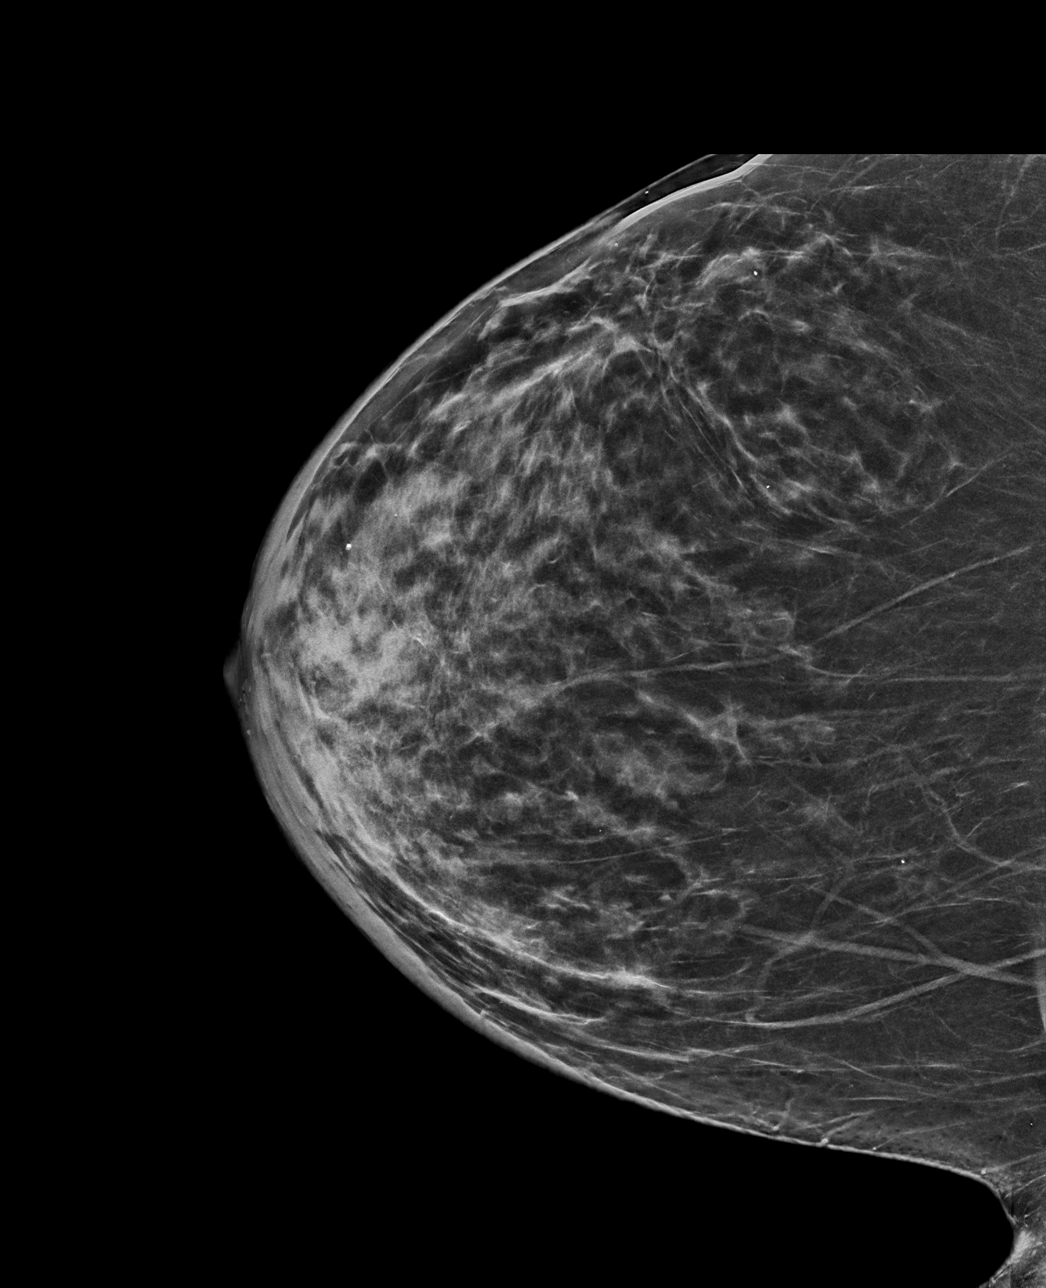

[R MLO synth-2D]
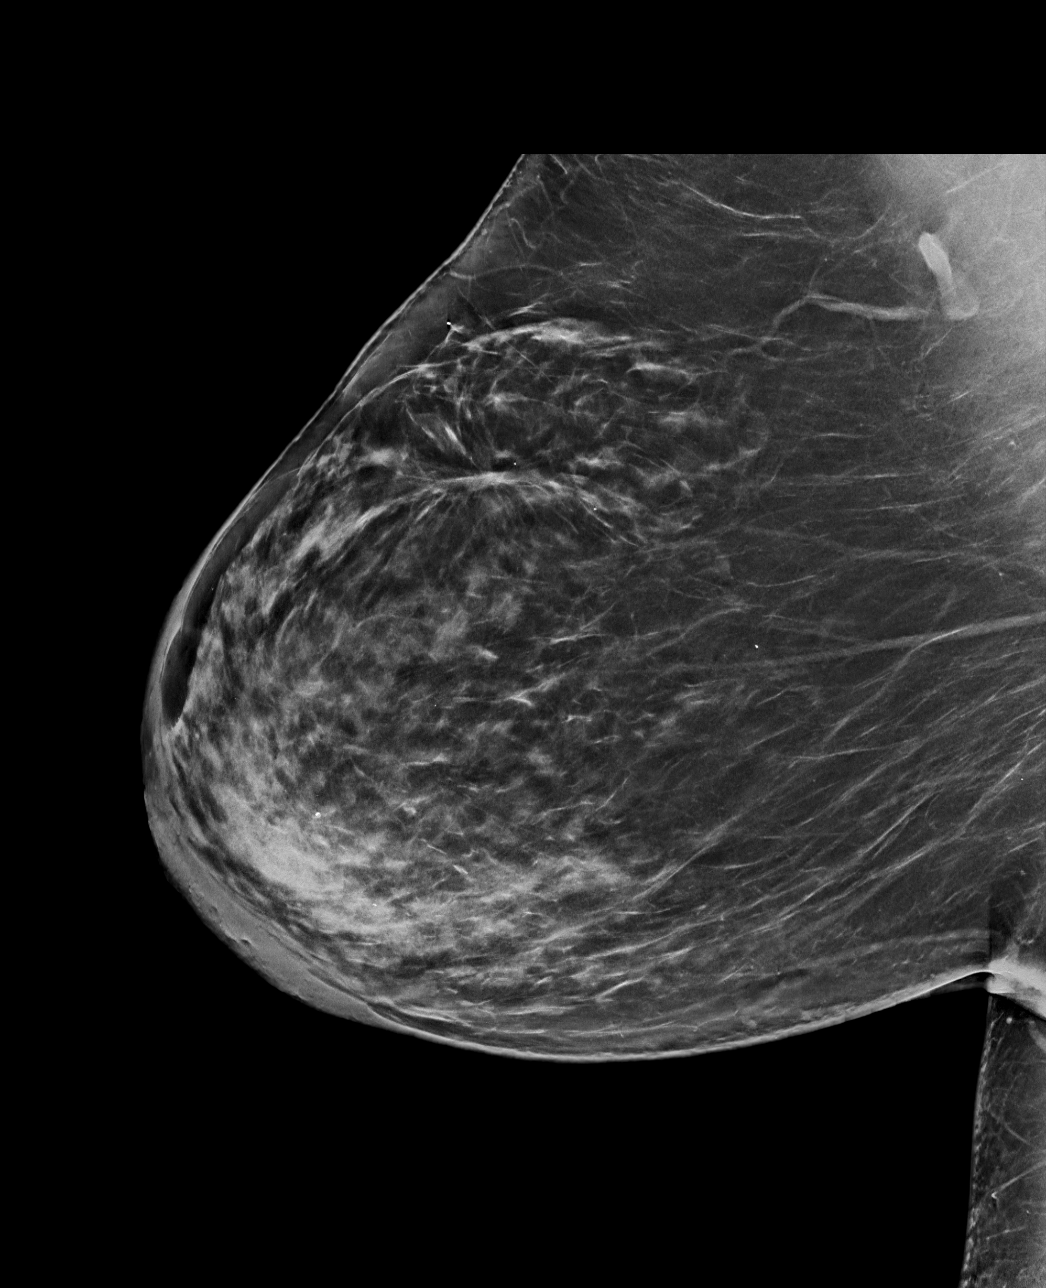

[R CC tomo · tomo slice 35/68.0]
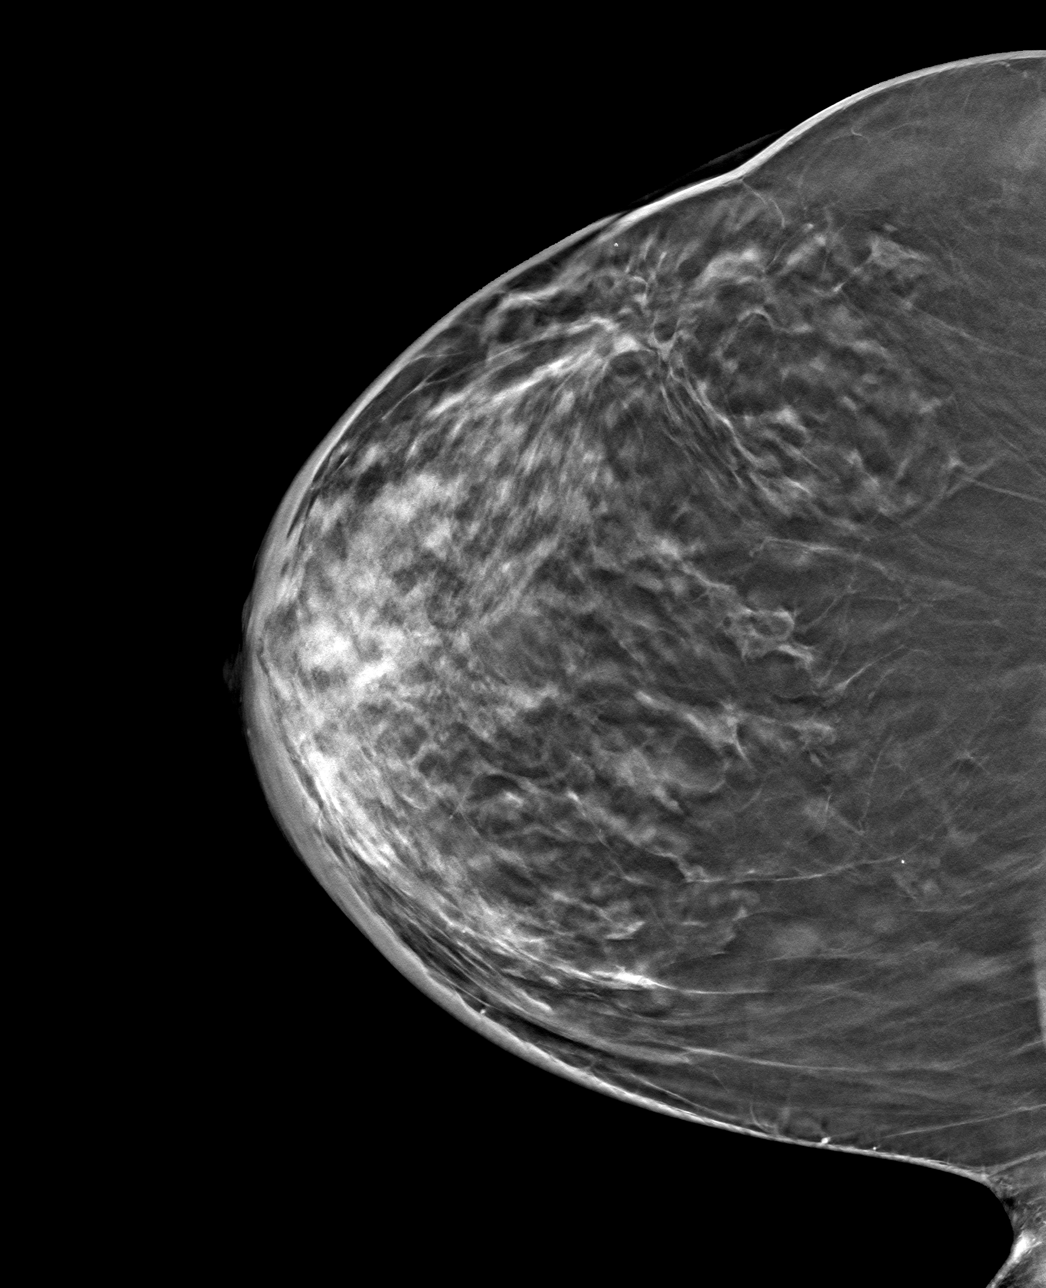

[R MLO tomo · tomo slice 43/85.0]
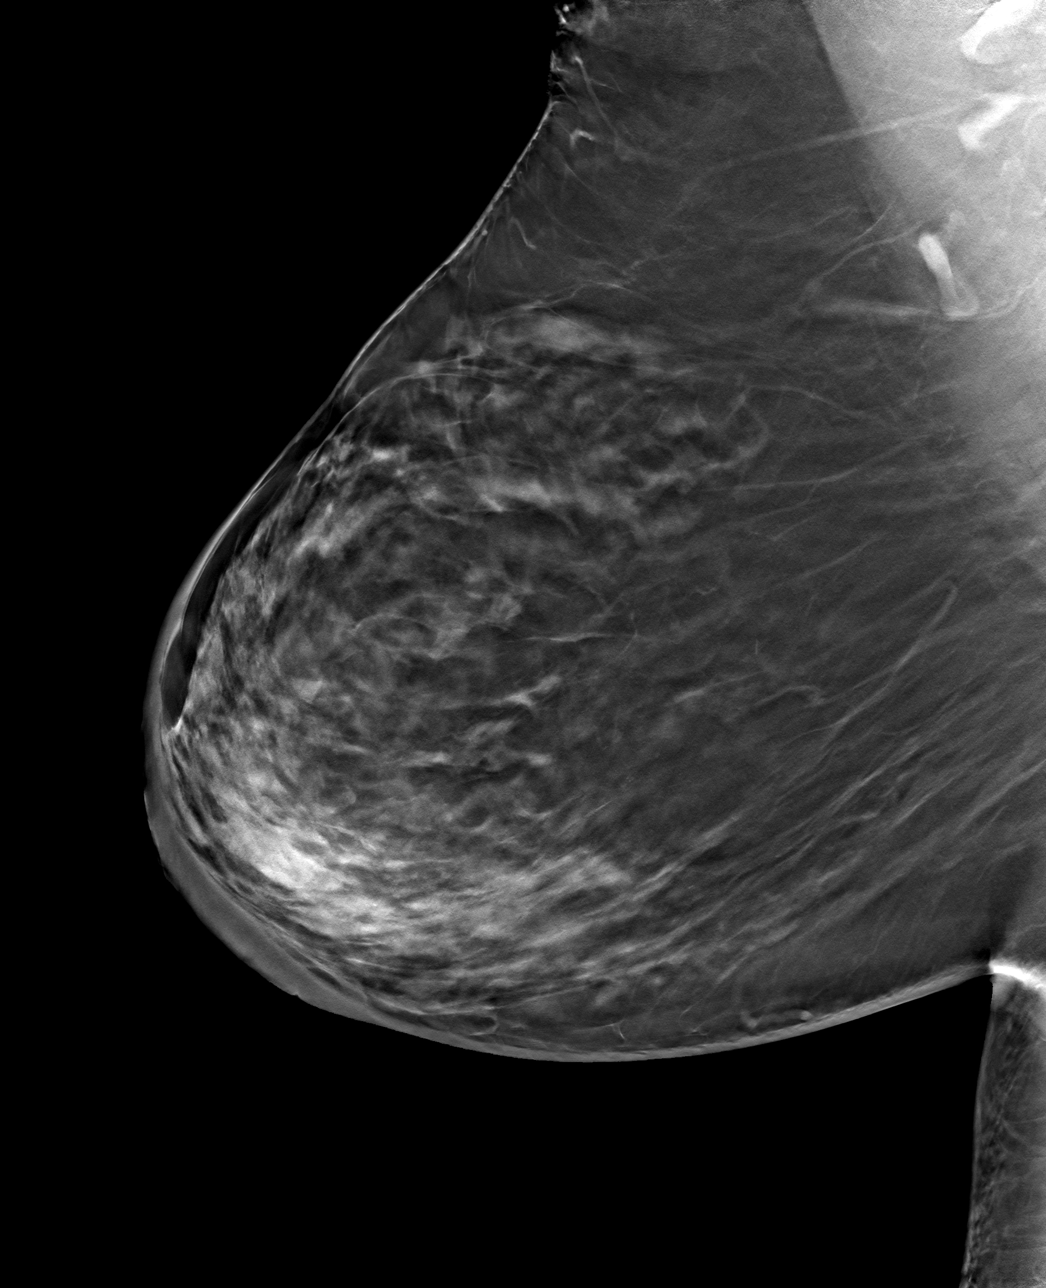

[4 of 12 positions shown; findings below may reference images not displayed]

Ordering physician's request describes a RIGHT breast mass at the 4
o'clock axis, 1 cm from the nipple. Patient believes the palpable
lump is more peripherally located within the outer RIGHT breast.

EXAM:
DIGITAL DIAGNOSTIC UNILATERAL RIGHT MAMMOGRAM WITH TOMOSYNTHESIS AND
CAD; ULTRASOUND RIGHT BREAST LIMITED
ACR Breast Density Category c: The breast tissue is heterogeneously
dense, which may obscure small masses.
FINDINGS: RIGHT breast diagnostic mammogram: There are stable postsurgical
changes within the upper RIGHT breast. There are no new dominant
masses, suspicious calcifications or secondary signs of malignancy
within the RIGHT breast

Targeted ultrasound is performed, evaluating the outer RIGHT breast
as directed by the ordering physician's request, showing only normal
fibroglandular tissues and fat lobules throughout. No solid or
cystic mass. More peripherally within the outer RIGHT breast, there
is expected postsurgical scarring. No suspicious solid or cystic
mass.

There is skin thickening at the RIGHT nipple and periareolar RIGHT
breast. Patient has a cardiac history and sleeps primarily on her
RIGHT side, explaining the chronic asymmetric skin thickening/edema.
IMPRESSION: No evidence of malignancy within the RIGHT breast. Stable
chronic/postsurgical changes.

RECOMMENDATION:
1. Annual screening mammograms.
2. Patient was encouraged to return for additional imaging if the
RIGHT breast mastitis returned.

I have discussed the findings and recommendations with the patient.
If applicable, a reminder letter will be sent to the patient
regarding the next appointment.

BI-RADS CATEGORY  2: Benign.

## 2021-11-16 DIAGNOSIS — U071 COVID-19: Secondary | ICD-10-CM | POA: Diagnosis not present

## 2021-11-30 DIAGNOSIS — M25561 Pain in right knee: Secondary | ICD-10-CM | POA: Diagnosis not present

## 2021-12-03 DIAGNOSIS — R112 Nausea with vomiting, unspecified: Secondary | ICD-10-CM | POA: Diagnosis not present

## 2021-12-03 DIAGNOSIS — J321 Chronic frontal sinusitis: Secondary | ICD-10-CM | POA: Diagnosis not present

## 2021-12-03 DIAGNOSIS — R030 Elevated blood-pressure reading, without diagnosis of hypertension: Secondary | ICD-10-CM | POA: Diagnosis not present

## 2021-12-03 DIAGNOSIS — H9201 Otalgia, right ear: Secondary | ICD-10-CM | POA: Diagnosis not present

## 2021-12-03 DIAGNOSIS — R42 Dizziness and giddiness: Secondary | ICD-10-CM | POA: Diagnosis not present

## 2021-12-07 ENCOUNTER — Other Ambulatory Visit (HOSPITAL_BASED_OUTPATIENT_CLINIC_OR_DEPARTMENT_OTHER): Payer: Self-pay | Admitting: Family Medicine

## 2021-12-07 ENCOUNTER — Ambulatory Visit (HOSPITAL_BASED_OUTPATIENT_CLINIC_OR_DEPARTMENT_OTHER)
Admission: RE | Admit: 2021-12-07 | Discharge: 2021-12-07 | Disposition: A | Discharge status: Home / Self Care | Payer: Medicare Other | Source: Ambulatory Visit | Attending: Family Medicine | Admitting: Family Medicine

## 2021-12-07 ENCOUNTER — Encounter (HOSPITAL_BASED_OUTPATIENT_CLINIC_OR_DEPARTMENT_OTHER): Payer: Self-pay

## 2021-12-07 ENCOUNTER — Telehealth: Payer: Self-pay | Admitting: Interventional Cardiology

## 2021-12-07 DIAGNOSIS — R1011 Right upper quadrant pain: Secondary | ICD-10-CM | POA: Insufficient documentation

## 2021-12-07 DIAGNOSIS — K429 Umbilical hernia without obstruction or gangrene: Secondary | ICD-10-CM | POA: Diagnosis not present

## 2021-12-07 DIAGNOSIS — N281 Cyst of kidney, acquired: Secondary | ICD-10-CM | POA: Diagnosis not present

## 2021-12-07 DIAGNOSIS — K760 Fatty (change of) liver, not elsewhere classified: Secondary | ICD-10-CM | POA: Diagnosis not present

## 2021-12-07 DIAGNOSIS — M545 Low back pain, unspecified: Secondary | ICD-10-CM | POA: Diagnosis not present

## 2021-12-07 LAB — POCT I-STAT CREATININE: Creatinine, Ser: 1.3 mg/dL — ABNORMAL HIGH (ref 0.44–1.00)

## 2021-12-07 MED ORDER — IOHEXOL 300 MG/ML  SOLN
100.0000 mL | Freq: Once | INTRAMUSCULAR | Status: AC | PRN
  Administered 2021-12-07: 80 mL via INTRAVENOUS

## 2021-12-07 NOTE — Telephone Encounter (Signed)
Patient called stating on 11/30/21 she noticed fluid behind her knee, states she received an injection same day from Dr. Gladstone Lighter. She states that same day she started experiencing vertigo and said she felt as though she had a sinus infection with pain in right ear. ? ?Patient then saw her PCP on 12/03/21 and told she has a sinus infection with fluid behind her right ear. Patient then states today she started experiencing pain to right side and has been having nausea and vomiting. Patient states she is not able to keep anything down. ? ?Patient states her PCP ordered a CT scan for today d/t suspected diverticulitis. Patient has not heard back about what CT showed yet. Patient denies CP, SOB, palpitations, not currently dizzy. Advised patient to take prescribed antiemtic Zofran and drink water with some Gatorade to replenish lost fluids. Also advised patient that if she continues to vomit and cannot keep fluids down that she may need to go to urgent care or ED to evaluate and receive IV fluids.  ? ?Patient verbalized understanding and expressed appreciation for listening and returning call. ?

## 2021-12-07 NOTE — Telephone Encounter (Signed)
STAT if patient feels like he/she is going to faint  ? ?Are you dizzy now?  ?Not currently ? ?Do you feel faint or have you passed out?  ?No  ? ?Do you have any other symptoms?  ?Patient states on 4/17 she experienced terrible vertigo. She states the room was spinning and she was unable to walk anywhere. She has been dizzy on and off ever since. She scheduled an appointment and saw her PCP on 4/20. By 4/20 she was also experiencing pain in the right side of her stomach, radiating to her side. Denies CP. She also states since the weekend she has been vomiting profusely. PCP saw her today and ordered a CT, assuming symptoms are due to diverticulitis. Patient has concerns that her symptoms may be cardiac related and requesting advisement from Dr. [name].  ? ?Have you checked your HR and BP (record if available)?  ?4/20: 167/?? ? ? ?

## 2021-12-08 NOTE — Telephone Encounter (Signed)
Patient calling back to speak with Drue Dun.  ?

## 2021-12-08 NOTE — Telephone Encounter (Signed)
CT results given by PCP and they referred her to urologist for cysts, patient said she is not sure if she is going to do that. Patient noted she was feeling better today. Want Dr. [name] to be made aware and take a look at CT result.   ?

## 2021-12-14 ENCOUNTER — Telehealth: Payer: Self-pay | Admitting: Interventional Cardiology

## 2021-12-14 ENCOUNTER — Telehealth: Payer: Self-pay

## 2021-12-14 NOTE — Telephone Encounter (Signed)
I would love to accept but currently only able to take family members of current patients and pediatric patients (in addition to referrals from physicians). I do not think this meets any of those criteria so unable to accept ?

## 2021-12-14 NOTE — Telephone Encounter (Signed)
Praluent is unlikely the cause of her abdominal bloating/abdominal pain. It is not the cause of her fatty liver.  ? ?Vertigo, n/v most likely explained by sinus infection. ? ?Is she still having abdominal bloating? She was put on a prednisone dose pack. This could have caused her fluid retention/abdominal distention. ?

## 2021-12-14 NOTE — Telephone Encounter (Signed)
Pt c/o medication issue: ? ?1. Name of Medication: Alirocumab ([place]) 150 MG/ML SOAJ ? ?2. How are you currently taking this medication (dosage and times per day)? Inject 1 Dose into the skin every 14 (fourteen) days. ? ?3. Are you having a reaction (difficulty breathing--STAT)? Yes  ? ?4. What is your medication issue? Patient experienced severe bloating (looked like she was pregnant), severe stomach, side, back pain. Severe throwing up and affected vertigo.  ?

## 2021-12-14 NOTE — Telephone Encounter (Signed)
Attempted to call patient, no answer, left message to return call.

## 2021-12-14 NOTE — Telephone Encounter (Signed)
Patient returning call. ? ?Patient states she experienced abdomen bloating, with hard distention noted on Friday 12/11/21. Patient states "I looked 12 months pregnant, it was so bad." Patient states she was supposed to give herself her Praluent injection that day but did not d/t bloating, stomach pain, nausea/vomiting she had been experiencing the week prior. ? ?Patient reports she is taking Atorvastatin '10mg'$  M/W/F while she is not taking the Praluent.  ? ?Patient states she has only experienced itching at injection site after giving Praluent injection and used hydrocortisone cream with relief. Patient is concerned that the Praluent is causing her abdominal swelling, abdominal pain. ? ?Patient also mentioned her recent CT scan this past week showed fatty liver and patient is very concerned about this. She states she is going to follow-up with her GI doctor about this to see if this could be causing her symptoms. ? ?Will forward to Pharm D for potential reaction to Praluent and Dr. [name] for review. ?

## 2021-12-14 NOTE — Telephone Encounter (Signed)
Patient states Carolyn Fleming - not sure of last name.  States he works at [employer].  States he has suggested patient to establish care with Dr. Yong Channel.  Would like to know if Dr. Yong Channel will take her on as a patient.

## 2021-12-16 DIAGNOSIS — G43809 Other migraine, not intractable, without status migrainosus: Secondary | ICD-10-CM | POA: Diagnosis not present

## 2021-12-16 DIAGNOSIS — U071 COVID-19: Secondary | ICD-10-CM | POA: Diagnosis not present

## 2021-12-16 NOTE — Telephone Encounter (Signed)
Left vm in regard.  Advised we could establish with Cherlynn Kaiser who is currently taking patients.

## 2022-01-12 DIAGNOSIS — R109 Unspecified abdominal pain: Secondary | ICD-10-CM | POA: Diagnosis not present

## 2022-01-12 DIAGNOSIS — E785 Hyperlipidemia, unspecified: Secondary | ICD-10-CM | POA: Diagnosis not present

## 2022-01-12 DIAGNOSIS — R1011 Right upper quadrant pain: Secondary | ICD-10-CM | POA: Diagnosis not present

## 2022-01-12 DIAGNOSIS — K760 Fatty (change of) liver, not elsewhere classified: Secondary | ICD-10-CM | POA: Diagnosis not present

## 2022-01-13 DIAGNOSIS — R1011 Right upper quadrant pain: Secondary | ICD-10-CM | POA: Diagnosis not present

## 2022-01-14 ENCOUNTER — Telehealth: Payer: Self-pay | Admitting: Interventional Cardiology

## 2022-01-14 DIAGNOSIS — R7309 Other abnormal glucose: Secondary | ICD-10-CM

## 2022-01-14 DIAGNOSIS — Z131 Encounter for screening for diabetes mellitus: Secondary | ICD-10-CM

## 2022-01-14 DIAGNOSIS — I25810 Atherosclerosis of coronary artery bypass graft(s) without angina pectoris: Secondary | ICD-10-CM

## 2022-01-14 NOTE — Telephone Encounter (Signed)
Spoke with patient who spent 25 minutes providing a history of what she has been dealing with.  Patient reports back on 11/20/21 she received an injection in her knee for arthritis, following this she developed symptoms of vertigo. She followed up with an ENT for possible sinusitis, then saw her PCP with complaints of abdominal pain, vomiting and weakness. PCP performed a STAT CT scan, patient reports she is upset because she did not have the results called to her until "11 the next day." Patient states CT scan came back clear only noting a fatty liver. Patient reports she stopped taking her Praluent when this all began, unsure if the medication was causing her symptoms. Patient states she then went to her OB/GYN who ran blood tests and found her thyroid to be "not functioning at all." Patient states she is having follow-up thyroid panel drawn with Dr. Sherran Needs office June 2nd, also turning in a 24h urine collection.  Patient states she needs to have hemoglobin A1c drawn and asked if it is OK to restart Praluent. Discussed the above with Dr. [name], per Dr. [name]: OK to restart Praluent and order Hgb A1c.  Patient verbalized understanding and expressed appreciation for listening to her and to Dr. [name] for his assistance.

## 2022-01-14 NOTE — Telephone Encounter (Signed)
Patient has not been feeling well. She went to her OB/GYN and had testing done after her PCP did not do any testing to determine why she had not been feeling well. She would like to talk to Dr.  [name] regarding some results from the testing

## 2022-01-15 DIAGNOSIS — R1011 Right upper quadrant pain: Secondary | ICD-10-CM | POA: Diagnosis not present

## 2022-01-18 ENCOUNTER — Other Ambulatory Visit: Payer: Medicare Other

## 2022-01-25 ENCOUNTER — Other Ambulatory Visit: Payer: Medicare Other

## 2022-01-25 DIAGNOSIS — R7309 Other abnormal glucose: Secondary | ICD-10-CM | POA: Diagnosis not present

## 2022-01-25 LAB — HEMOGLOBIN A1C
Est. average glucose Bld gHb Est-mCnc: 180 mg/dL
Hgb A1c MFr Bld: 7.9 % — ABNORMAL HIGH (ref 4.8–5.6)

## 2022-01-26 ENCOUNTER — Ambulatory Visit (HOSPITAL_BASED_OUTPATIENT_CLINIC_OR_DEPARTMENT_OTHER): Payer: Medicare Other | Admitting: Nurse Practitioner

## 2022-01-28 ENCOUNTER — Telehealth: Payer: Self-pay

## 2022-01-28 NOTE — Telephone Encounter (Signed)
-----   Message from [name], MD sent at 01/28/2022 10:23 AM EDT ----- Let the patient know that her A1c is disappointingly high.  Needs to talk with endocrinologist/primary care about improvement.  We like for to be less than 7. A copy will be sent to Stacie Glaze, DO

## 2022-01-28 NOTE — Telephone Encounter (Signed)
Spoke with patient to discuss results of Hgb A1c.  Per Dr. [name]: Let the patient know that her A1c is disappointingly high.  Needs to talk with endocrinologist/primary care about improvement.  We like for to be less than 7.  Patient verbalized understanding and states she is in the process of finding a new PCP.   Patient reports she is feeling better than she was a week ago.  Dr. [name] also reviewed labs and CT scan patient brought to office on 01/25/22 and no changes from a cardiac standpoint.  Patient states she is going to work on decreasing her Hgb A1c and will let us know when she finds a new PCP. She also reports she started taking her Praluent again 2 weeks ago and will continue to take it as prescribed.  Patient also expressed appreciation for Dr. [name] and the care he provides.  Will forward lab results to Dr. Arvella Nigh per patient's request.

## 2022-02-01 DIAGNOSIS — R891 Abnormal level of hormones in specimens from other organs, systems and tissues: Secondary | ICD-10-CM | POA: Diagnosis not present

## 2022-02-01 DIAGNOSIS — E7800 Pure hypercholesterolemia, unspecified: Secondary | ICD-10-CM | POA: Diagnosis not present

## 2022-02-01 DIAGNOSIS — R7989 Other specified abnormal findings of blood chemistry: Secondary | ICD-10-CM | POA: Diagnosis not present

## 2022-02-01 DIAGNOSIS — E039 Hypothyroidism, unspecified: Secondary | ICD-10-CM | POA: Diagnosis not present

## 2022-02-01 DIAGNOSIS — R109 Unspecified abdominal pain: Secondary | ICD-10-CM | POA: Diagnosis not present

## 2022-02-01 DIAGNOSIS — K760 Fatty (change of) liver, not elsewhere classified: Secondary | ICD-10-CM | POA: Diagnosis not present

## 2022-02-09 ENCOUNTER — Other Ambulatory Visit: Payer: Self-pay | Admitting: *Deleted

## 2022-02-09 DIAGNOSIS — I25810 Atherosclerosis of coronary artery bypass graft(s) without angina pectoris: Secondary | ICD-10-CM

## 2022-02-09 DIAGNOSIS — E785 Hyperlipidemia, unspecified: Secondary | ICD-10-CM

## 2022-02-09 DIAGNOSIS — M791 Myalgia, unspecified site: Secondary | ICD-10-CM

## 2022-02-09 DIAGNOSIS — I739 Peripheral vascular disease, unspecified: Secondary | ICD-10-CM

## 2022-02-09 DIAGNOSIS — I10 Essential (primary) hypertension: Secondary | ICD-10-CM

## 2022-02-17 ENCOUNTER — Telehealth: Payer: Self-pay | Admitting: Internal Medicine

## 2022-02-17 NOTE — Telephone Encounter (Signed)
Patient is schd for her lipid f/u on 11/9. She want to make sure it will be okay for her to wait that long. Please advise

## 2022-02-17 NOTE — Telephone Encounter (Signed)
Patient wanted a sooner appointment than 06/24/22. Scheduled for 05/24/22.

## 2022-02-17 NOTE — Telephone Encounter (Signed)
Spoke with patient. Informed her that 10/9 appointment is OK. She will do fasting labs about 1-2 weeks before this visit.

## 2022-02-22 DIAGNOSIS — H401122 Primary open-angle glaucoma, left eye, moderate stage: Secondary | ICD-10-CM | POA: Diagnosis not present

## 2022-02-22 DIAGNOSIS — H401113 Primary open-angle glaucoma, right eye, severe stage: Secondary | ICD-10-CM | POA: Diagnosis not present

## 2022-04-06 DIAGNOSIS — E039 Hypothyroidism, unspecified: Secondary | ICD-10-CM | POA: Diagnosis not present

## 2022-04-14 ENCOUNTER — Telehealth: Payer: Self-pay | Admitting: Internal Medicine

## 2022-04-14 ENCOUNTER — Other Ambulatory Visit: Payer: Self-pay | Admitting: Internal Medicine

## 2022-04-14 MED ORDER — PRALUENT 150 MG/ML ~~LOC~~ SOAJ: 1.0000 | SUBCUTANEOUS | 11 refills | Status: DC

## 2022-04-14 NOTE — Telephone Encounter (Signed)
Pt c/o medication issue:  1. Name of Medication: Alirocumab (PRALUENT) 150 MG/ML SOAJ  2. How are you currently taking this medication (dosage and times per day)? As prescribed   3. Are you having a reaction (difficulty breathing--STAT)?   4. What is your medication issue? Pt is requesting call back in regards to this medication. She states that she usually has a year refill called in, but pharmacy informed her that she only received 6 refills this time and should would like a call back to discuss this.

## 2022-04-14 NOTE — Addendum Note (Signed)
Addended by: Fidel Levy on: 04/14/2022 04:38 PM   Modules accepted: Orders

## 2022-04-14 NOTE — Telephone Encounter (Signed)
Per operator message, patient needed a refill. [facility]. Patient requested call when things were sorted out with pharmacy. [facility] - confirmed Rx received, they need to order. Was informed that med will have to be ordered, ready tomorrow. Called patient to update her.

## 2022-04-14 NOTE — Telephone Encounter (Signed)
LM for patient that med was refilled in March (as copied below) and to call back if she will be short on refills before her Oct 2023 visit  Order Providers  Prescribing Provider Encounter Provider  [place], Nadean Corwin, MD Willaim Sheng, CMA   Outpatient Medication Detail   Disp Refills Start End   Alirocumab (PRALUENT) 150 MG/ML SOAJ 2 mL 6 10/23/2021    Sig - Route: Inject 1 Dose into the skin every 14 (fourteen) days. - Subcutaneous   Sent to pharmacy as: Alirocumab ([place]) 150 MG/ML Solution Auto-injector   E-Prescribing Status: Receipt confirmed by pharmacy (10/23/2021 12:40 PM EST)    Pharmacy  [facility], [address]

## 2022-04-14 NOTE — Telephone Encounter (Signed)
Patient is returning Jenna's call. She states the pharmacy told her she is out of refills and she is due for the medication today. Please advise.

## 2022-05-11 ENCOUNTER — Other Ambulatory Visit: Payer: Self-pay

## 2022-05-11 DIAGNOSIS — Z79899 Other long term (current) drug therapy: Secondary | ICD-10-CM

## 2022-05-11 LAB — LIPID PANEL
Chol/HDL Ratio: 3.2 ratio (ref 0.0–4.4)
Cholesterol, Total: 134 mg/dL (ref 100–199)
HDL: 42 mg/dL (ref >39)
LDL Chol Calc (NIH): 68 mg/dL (ref 0–99)
Triglycerides: 138 mg/dL (ref 0–149)
VLDL Cholesterol Cal: 24 mg/dL (ref 5–40)

## 2022-05-24 ENCOUNTER — Encounter: Payer: Self-pay | Admitting: Internal Medicine

## 2022-05-24 ENCOUNTER — Ambulatory Visit: Payer: Medicare Other | Attending: Internal Medicine | Admitting: Internal Medicine

## 2022-05-24 VITALS — BP 128/82 | HR 84 | Ht 62.0 in | Wt 190.0 lb

## 2022-05-24 DIAGNOSIS — E785 Hyperlipidemia, unspecified: Secondary | ICD-10-CM | POA: Diagnosis not present

## 2022-05-24 DIAGNOSIS — T466X5A Adverse effect of antihyperlipidemic and antiarteriosclerotic drugs, initial encounter: Secondary | ICD-10-CM | POA: Diagnosis not present

## 2022-05-24 DIAGNOSIS — I25810 Atherosclerosis of coronary artery bypass graft(s) without angina pectoris: Secondary | ICD-10-CM

## 2022-05-24 DIAGNOSIS — E038 Other specified hypothyroidism: Secondary | ICD-10-CM | POA: Diagnosis not present

## 2022-05-24 DIAGNOSIS — I739 Peripheral vascular disease, unspecified: Secondary | ICD-10-CM | POA: Diagnosis not present

## 2022-05-24 DIAGNOSIS — T466X5D Adverse effect of antihyperlipidemic and antiarteriosclerotic drugs, subsequent encounter: Secondary | ICD-10-CM | POA: Diagnosis not present

## 2022-05-24 DIAGNOSIS — M791 Myalgia, unspecified site: Secondary | ICD-10-CM | POA: Insufficient documentation

## 2022-05-24 NOTE — Progress Notes (Signed)
LIPID CLINIC CONSULT NOTE  Chief Complaint:  Manage dyslipidemia  Primary Care Physician: Pcp, No  Primary Cardiologist:  [name], MD  HPI:  Carolyn Fleming is a 71 y.o. female who is being seen today for the evaluation of dyslipidemia at the request of No ref. provider found. This is a pleasant 71 year old female with history of coronary disease status post CABG x2 emergently with LIMA to LAD and SVG to OM1 in 2013.  She also has hypertension, dyslipidemia and obesity.  She previously had had breast cancer of the right breast and underwent chemotherapy when she was in [place].  Her last cardiac catheterization was in September 2016, demonstrating total occlusion of the distal left main and patent SVG and LIMA grafts.  She has struggled with dyslipidemia and relative statin intolerance.  Although she has tried a number of statins before, she seems to be able to tolerate atorvastatin in the past.  Currently she is taking 10 mg 3 times weekly and her most recent lipid profile in October showed total cholesterol 173, triglycerides 113, HDL 38 and LDL 114.  08/27/2019  Carolyn Fleming is seen today in follow-up.  Overall she has been doing very well on Praluent.  She is tolerated the injections and actually says she feels better on the medicine.  She has had marked improvement in her dyslipidemia.  Her total cholesterol now is 99, HDL 41, triglycerides 137 and LDL 34.  She remains on low-dose atorvastatin 10 mg 3 times weekly.  She still has some intermittent discomfort in her legs which may be myalgias related to the statin.  As her LDL is quite below eventually we may be able to discontinue the statin.  I typically do that when the LDLs below 25.  Additionally she might qualify for lower dose Praluent 75 mg every 2 weekly.  She also recently had regulation of her thyroid which was hypothyroid.  This may also help her with weight loss and ultimately improve her lipid  profile.  04/03/20  Carolyn Fleming is seen today in follow-up.  Overall she now says she is feeling the best she has in years.  She was previously on thyroid medicine and has discontinued it because she was having significant fatigue and weakness and the symptoms seem to have resolved.  In addition we had taken off of atorvastatin a while ago as her response to Praluent was excellent with LDL in the 30s.  Subsequently she had a repeat lipid profile as of August 12 which showed total cholesterol 134, triglycerides 122, HDL 40 and LDL 72.  This is very near goal LDL less than 70 indicates very good control of her lipids.  Based on her side effects I would remain off of statins completely at this point.  05/24/2022  Carolyn Fleming returns today for follow-up.  She continues to do well with Praluent.  She tells me unfortunately over the past several months she felt awful.  She called into the office wondering if it might be a side effect of the medicine.  She had visited her PCP and had a thorough work-up.  No clear cause was found but then she saw her GYN who checked her thyroid and noted it was severely hypothyroid.  Apparently she was taken off of her thyroid medicine by her PCP but clearly she needed it.  She is now back on levothyroxine and she is feeling much better.  Current lipid profile showed total cholesterol 134, triglycerides 138, HDL  42 and LDL 68.  PMHx:  Past Medical History:  Diagnosis Date   Arthritis    Asthma    Breast cancer ([place])    Right breast    GERD (gastroesophageal reflux disease)    Glaucoma    Heart disease    Hyperlipidemia    Hypertension 01/25/2012   Obesity (BMI 35.0-39.9 without comorbidity) 01/25/2012   S/P CABG x 2 01/24/2012   Emergency LIMA to LAD, SVG to OM1, EVH via right thigh   Sleep apnea    uses cpap   Thyroid disease     Past Surgical History:  Procedure Laterality Date   ABDOMINAL HYSTERECTOMY     BREAST LUMPECTOMY  1997   breast cancer   CARDIAC  CATHETERIZATION N/A 05/07/2015   Procedure: Left Heart Cath and Cors/Grafts Angiography;  Surgeon: [name], MD;  Location: [facility];  Service: Cardiovascular;  Laterality: N/A;   CHOLECYSTECTOMY     CORONARY ARTERY BYPASS GRAFT  01/24/2012   Procedure: CORONARY ARTERY BYPASS GRAFTING (CABG);  Surgeon: [name], MD;  Location: [facility];  Service: Open Heart Surgery;  Laterality: N/A;  Coronary Artery bypass Graft times two utilizing the left internal mammary artery and the right greater saphenous vein harvested endoscopically,  Transesophageal Echocardiogram   LEFT HEART CATHETERIZATION WITH CORONARY ANGIOGRAM N/A 01/24/2012   Procedure: LEFT HEART CATHETERIZATION WITH CORONARY ANGIOGRAM;  Surgeon: [name], MD;  Location: [facility] C[facility];  Service: Cardiovascular;  Laterality: N/A;   TONSILLECTOMY     TUBAL LIGATION      FAMHx:  Family History  Problem Relation Age of Onset   Cancer Mother        Colon   Diabetes Mother    Hypertension Mother    Diabetes Sister    Cancer Brother        Lung   Diabetes Brother    Hypertension Brother    Breast cancer Niece        59s    SOCHx:   reports that she has never smoked. She has never used smokeless tobacco. She reports that she does not drink alcohol and does not use drugs.  ALLERGIES:  Allergies  Allergen Reactions   Acetazolamide Other (See Comments)   Amoxicillin Er Other (See Comments)   Augmentin [Amoxicillin-Pot Clavulanate]     Heart racing   Codeine Other (See Comments)    Lethargy   Fluticasone Other (See Comments)   Iodine Other (See Comments)    Per allergy test   Latex     REACTION: hives   Levofloxacin Other (See Comments)   Lisinopril Other (See Comments)   Meloxicam Other (See Comments)   Propofol Other (See Comments)   Statins Other (See Comments)    Atorvastatin, rosuvastatin   Sulfonamide Derivatives     REACTION: gi upset    ROS: Pertinent items noted in HPI and remainder of  comprehensive ROS otherwise negative.  HOME MEDS: Current Outpatient Medications on File Prior to Visit  Medication Sig Dispense Refill   Alirocumab (PRALUENT) 150 MG/ML SOAJ Inject 1 Dose into the skin every 14 (fourteen) days. 2 mL 11   amLODipine (NORVASC) 5 MG tablet TAKE 1 TABLET BY MOUTH EVERY DAY 90 tablet 3   aspirin 81 MG chewable tablet Aspir-81     Azelastine HCl 0.15 % SOLN azelastine 0.15 % (205.5 mcg) nasal spray  USE 1 SPRAY INTO EACH NOSTRIL TWICE A DAY     Dorzolamide HCl-Timolol Mal  PF 2-0.5 % SOLN Place 2 % into the left eye in the morning and at bedtime.      hydrocortisone cream 1 % Apply 1 application topically as needed for itching.     ketoconazole (NIZORAL) 2 % cream Apply 1 application topically as needed for irritation.     levothyroxine (SYNTHROID) 50 MCG tablet Take 50 mcg by mouth daily.     loratadine (CLARITIN) 10 MG tablet TAKE 1 TABLET BY MOUTH EVERY DAY 90 tablet 0   losartan-hydrochlorothiazide (HYZAAR) 50-12.5 MG tablet TAKE 1 TABLET DAILY. PLEASE KEEP UPCOMING APPT WITH DR. [name] IN NOVEMBER FOR FUTURE REFILL 90 tablet 3   meclizine (ANTIVERT) 12.5 MG tablet Take 1 tablet (12.5 mg total) by mouth 3 (three) times daily as needed for dizziness. 90 tablet 0   ondansetron (ZOFRAN) 8 MG tablet TAKE 1 TABLET BY MOUTH TWICE A DAY AS NEEDED FOR NAUSEA  1   Travoprost, BAK Free, (TRAVATAN) 0.004 % SOLN ophthalmic solution Place 1 drop into the left eye.      diazepam (VALIUM) 5 MG tablet Take 1 tablet (5 mg total) by mouth every 8 (eight) hours as needed (dizziness). (Patient not taking: Reported on 05/24/2022) 30 tablet 0   No current facility-administered medications on file prior to visit.    LABS/IMAGING: No results found for this or any previous visit (from the past 48 hour(s)). No results found.  LIPID PANEL:    Component Value Date/Time   CHOL 134 05/11/2022 1028   TRIG 138 05/11/2022 1028   HDL 42 05/11/2022 1028   CHOLHDL 3.2 05/11/2022 1028    CHOLHDL 4.4 12/12/2015 0828   VLDL 29 12/12/2015 0828   LDLCALC 68 05/11/2022 1028   LDLDIRECT 149.1 05/28/2013 0946    WEIGHTS: Wt Readings from Last 3 Encounters:  05/24/22 190 lb (86.2 kg)  08/21/21 190 lb (86.2 kg)  04/02/21 189 lb (85.7 kg)    VITALS: BP 128/82   Pulse 84   Ht '5\' 2"'$  (1.575 m)   Wt 190 lb (86.2 kg)   SpO2 97%   BMI 34.75 kg/m   EXAM: Deferred  EKG: Deferred  ASSESSMENT: Mixed dyslipidemia, goal LDL<70 Statin intolerance Coronary artery disease status post two-vessel CABG (2013) PAD Hypertension Hypothyroidism  PLAN: 1.  Ms. Fleming has done very well on Praluent therapy.  She was found recently to be severely hypothyroid having been taken off of her thyroid medications.  She is now back on it and seems to be feeling much better.  She is still at goal LDL less than 70.  We will continue current therapies.  She has an upcoming echo and follow-up with Dr. [name] in December.  I am happy to see her back annually or sooner as necessary.  Pixie Casino, MD, [facility], [place] Director of the Advanced Lipid Disorders &  Cardiovascular Risk Reduction Clinic Diplomate of the American Board of Clinical Lipidology Attending Cardiologist  Direct Dial: [phone]  Fax: [phone]  Website:  [email or web address] 05/24/2022, 1:54 PM

## 2022-05-24 NOTE — Patient Instructions (Signed)
Medication Instructions:  Your physician recommends that you continue on your current medications as directed. Please refer to the Current Medication list given to you today.  *If you need a refill on your cardiac medications before your next appointment, please call your pharmacy*  Follow-Up: At [facility], you and your health needs are our priority.  As part of our continuing mission to provide you with exceptional heart care, we have created designated Provider Care Teams.  These Care Teams include your primary Cardiologist (physician) and Advanced Practice Providers (APPs -  Physician Assistants and Nurse Practitioners) who all work together to provide you with the care you need, when you need it.  We recommend signing up for the patient portal called "MyChart".  Sign up information is provided on this After Visit Summary.  MyChart is used to connect with patients for Virtual Visits (Telemedicine).  Patients are able to view lab/test results, encounter notes, upcoming appointments, etc.  Non-urgent messages can be sent to your provider as well.   To learn more about what you can do with MyChart, go to [email or web address].    Your next appointment:   12 month(s)  The format for your next appointment:   In Person  Provider:   Dr. Debara Pickett  (fasting labs will be needed about 1-2 weeks before)

## 2022-05-26 ENCOUNTER — Telehealth: Payer: Self-pay | Admitting: Interventional Cardiology

## 2022-05-26 DIAGNOSIS — E1165 Type 2 diabetes mellitus with hyperglycemia: Secondary | ICD-10-CM | POA: Diagnosis not present

## 2022-05-26 DIAGNOSIS — I10 Essential (primary) hypertension: Secondary | ICD-10-CM | POA: Diagnosis not present

## 2022-05-26 DIAGNOSIS — E782 Mixed hyperlipidemia: Secondary | ICD-10-CM | POA: Diagnosis not present

## 2022-05-26 DIAGNOSIS — Z951 Presence of aortocoronary bypass graft: Secondary | ICD-10-CM | POA: Diagnosis not present

## 2022-05-26 DIAGNOSIS — E039 Hypothyroidism, unspecified: Secondary | ICD-10-CM | POA: Diagnosis not present

## 2022-05-26 DIAGNOSIS — I25810 Atherosclerosis of coronary artery bypass graft(s) without angina pectoris: Secondary | ICD-10-CM | POA: Diagnosis not present

## 2022-05-26 NOTE — Telephone Encounter (Signed)
Returned call to patient. She states she has been getting 5 year handicap placards from PCP. She no longer has a PCP and is requesting a permanent handicap placard from Dr. [name]. She states she has arthritis in her knees and tires quickly with activity. She states this in chronic and is "only going to get worse as I get older."  She also states she heard Dr. [name] is retiring and would like to know who he recommends she follow-up with. Patient has appt with Dr. [name] in December and can discuss at that time.  Will forward to Dr. [name] to review and advise on whether or not he is able to sign for permanent handicap placard.

## 2022-05-26 NOTE — Telephone Encounter (Signed)
Addressed in other phone note.

## 2022-05-26 NOTE — Telephone Encounter (Signed)
Patient calling to see if Dr. [name] can filled out her forms to renew her handicapped sticker. Please advise

## 2022-05-26 NOTE — Telephone Encounter (Signed)
Patient called and said that she would prefer to fill out a form to get a permanent placard instead of keep getting five year placard. Patient would like to talk with Dr. [name] regarding the permanent placard.

## 2022-06-24 ENCOUNTER — Ambulatory Visit: Payer: Medicare Other | Admitting: Internal Medicine

## 2022-06-29 ENCOUNTER — Ambulatory Visit (HOSPITAL_COMMUNITY): Payer: Medicare Other | Attending: Interventional Cardiology

## 2022-06-29 DIAGNOSIS — E669 Obesity, unspecified: Secondary | ICD-10-CM | POA: Diagnosis not present

## 2022-06-29 DIAGNOSIS — I10 Essential (primary) hypertension: Secondary | ICD-10-CM | POA: Insufficient documentation

## 2022-06-29 DIAGNOSIS — I25810 Atherosclerosis of coronary artery bypass graft(s) without angina pectoris: Secondary | ICD-10-CM | POA: Insufficient documentation

## 2022-06-29 DIAGNOSIS — G4733 Obstructive sleep apnea (adult) (pediatric): Secondary | ICD-10-CM | POA: Insufficient documentation

## 2022-06-29 DIAGNOSIS — E785 Hyperlipidemia, unspecified: Secondary | ICD-10-CM | POA: Diagnosis not present

## 2022-06-29 DIAGNOSIS — I739 Peripheral vascular disease, unspecified: Secondary | ICD-10-CM | POA: Diagnosis not present

## 2022-06-29 LAB — ECHOCARDIOGRAM COMPLETE
Area-P 1/2: 3.97 cm2
S' Lateral: 2.9 cm

## 2022-07-07 ENCOUNTER — Other Ambulatory Visit: Payer: Self-pay | Admitting: Internal Medicine

## 2022-07-16 DIAGNOSIS — I129 Hypertensive chronic kidney disease with stage 1 through stage 4 chronic kidney disease, or unspecified chronic kidney disease: Secondary | ICD-10-CM | POA: Diagnosis not present

## 2022-07-16 DIAGNOSIS — N390 Urinary tract infection, site not specified: Secondary | ICD-10-CM | POA: Diagnosis not present

## 2022-07-16 DIAGNOSIS — N1831 Chronic kidney disease, stage 3a: Secondary | ICD-10-CM | POA: Diagnosis not present

## 2022-07-16 DIAGNOSIS — N2581 Secondary hyperparathyroidism of renal origin: Secondary | ICD-10-CM | POA: Diagnosis not present

## 2022-07-16 DIAGNOSIS — E1122 Type 2 diabetes mellitus with diabetic chronic kidney disease: Secondary | ICD-10-CM | POA: Diagnosis not present

## 2022-07-16 DIAGNOSIS — N281 Cyst of kidney, acquired: Secondary | ICD-10-CM | POA: Diagnosis not present

## 2022-07-16 DIAGNOSIS — E785 Hyperlipidemia, unspecified: Secondary | ICD-10-CM | POA: Diagnosis not present

## 2022-07-20 ENCOUNTER — Ambulatory Visit (HOSPITAL_BASED_OUTPATIENT_CLINIC_OR_DEPARTMENT_OTHER): Payer: Medicare Other | Admitting: Family Medicine

## 2022-07-20 NOTE — Progress Notes (Unsigned)
Cardiology Office Note:    Date:  07/22/2022   ID:  Carolyn Fleming, DOB Jan 08, 1951, MRN [id number]  PCP:  [name], No  Cardiologist:  [name], MD   Referring MD: No ref. provider found   Chief Complaint  Patient presents with   Congestive Heart Failure   Coronary Artery Disease   Hypertension    History of Present Illness:    Carolyn Fleming is a 71 y.o. female with a hx of CAD, prior anterior non-ST elevation MI, coronary bypass grafting with LIMA to LAD, SVG to OM 2013, hypertension, abnormal right LE doppler waveforms, hyperlipidemia intolerant of statins now on PCSK-9 (referred to [name], MD 05/2019), and and BMI 35.      She has a lot of concern about her primary physicians.  She is upset that no one told her about findings on an abdominal CT scan that was performed.  The CT revealed a kidney cyst and I have hiatal and ventral hernia.  She has now seen [name] because of the kidney cyst.  She does not have a current primary physician.  She has decided not to go back to the prior physician because of lack of communication.  She had a long period of nausea and vomiting.  A primary physician took her off of Synthroid.  Her problem started after that.  Her gynecologist is resuming the Synthroid.  Her symptoms have improved.   During this timeframe, she has had no significant episodes of chest discomfort, dyspnea, swelling, or other problems.  Blood pressure is alarmingly high today.  She has not taken any of her antihypertensive regimen.  Past Medical History:  Diagnosis Date   Arthritis    Asthma    Breast cancer ([place])    Right breast    GERD (gastroesophageal reflux disease)    Glaucoma    Heart disease    Hyperlipidemia    Hypertension 01/25/2012   Obesity (BMI 35.0-39.9 without comorbidity) 01/25/2012   S/P CABG x 2 01/24/2012   Emergency LIMA to LAD, SVG to OM1, EVH via right thigh   Sleep apnea    uses cpap   Thyroid disease     Past Surgical  History:  Procedure Laterality Date   ABDOMINAL HYSTERECTOMY     BREAST LUMPECTOMY  1997   breast cancer   CARDIAC CATHETERIZATION N/A 05/07/2015   Procedure: Left Heart Cath and Cors/Grafts Angiography;  Surgeon: [name], MD;  Location: [place] [facility];  Service: Cardiovascular;  Laterality: N/A;   CHOLECYSTECTOMY     CORONARY ARTERY BYPASS GRAFT  01/24/2012   Procedure: CORONARY ARTERY BYPASS GRAFTING (CABG);  Surgeon: [name], MD;  Location: [facility];  Service: Open Heart Surgery;  Laterality: N/A;  Coronary Artery bypass Graft times two utilizing the left internal mammary artery and the right greater saphenous vein harvested endoscopically,  Transesophageal Echocardiogram   LEFT HEART CATHETERIZATION WITH CORONARY ANGIOGRAM N/A 01/24/2012   Procedure: LEFT HEART CATHETERIZATION WITH CORONARY ANGIOGRAM;  Surgeon: [name], MD;  Location[facility];  Service: Cardiovascular;  Laterality: N/A;   TONSILLECTOMY     TUBAL LIGATION      Current Medications: Current Meds  Medication Sig   Alirocumab (PRALUENT) 150 MG/ML SOAJ Inject 1 Dose into the skin every 14 (fourteen) days.   amLODipine (NORVASC) 5 MG tablet TAKE 1 TABLET BY MOUTH EVERY DAY   aspirin 81 MG chewable tablet Chew 81 mg by mouth daily.  Azelastine HCl 0.15 % SOLN azelastine 0.15 % (205.5 mcg) nasal spray  USE 1 SPRAY INTO EACH NOSTRIL TWICE A DAY   diazepam (VALIUM) 5 MG tablet Take 1 tablet (5 mg total) by mouth every 8 (eight) hours as needed (dizziness).   Dorzolamide HCl-Timolol Mal PF 2-0.5 % SOLN Place 2 % into the left eye in the morning and at bedtime.    hydrocortisone cream 1 % Apply 1 application topically as needed for itching.   ketoconazole (NIZORAL) 2 % cream Apply 1 application topically as needed for irritation.   levothyroxine (SYNTHROID) 50 MCG tablet Take 50 mcg by mouth daily.   loratadine (CLARITIN) 10 MG tablet TAKE 1 TABLET BY MOUTH EVERY DAY   losartan-hydrochlorothiazide  (HYZAAR) 50-12.5 MG tablet TAKE 1 TABLET DAILY. PLEASE KEEP UPCOMING APPT WITH DR. [name] IN NOVEMBER FOR FUTURE REFILL   meclizine (ANTIVERT) 12.5 MG tablet Take 1 tablet (12.5 mg total) by mouth 3 (three) times daily as needed for dizziness.   nitrofurantoin, macrocrystal-monohydrate, (MACROBID) 100 MG capsule Take 100 mg by mouth 2 (two) times daily.   nitroGLYCERIN (NITROSTAT) 0.4 MG SL tablet 0.4 mg every 5 (five) minutes as needed for chest pain.   ondansetron (ZOFRAN) 8 MG tablet TAKE 1 TABLET BY MOUTH TWICE A DAY AS NEEDED FOR NAUSEA   Travoprost, BAK Free, (TRAVATAN) 0.004 % SOLN ophthalmic solution Place 1 drop into the left eye.      Allergies:   Acetazolamide, Amoxicillin er, Augmentin [amoxicillin-pot clavulanate], Codeine, Fluticasone, Iodine, Latex, Levofloxacin, Lisinopril, Meloxicam, Propofol, Statins, and Sulfonamide derivatives   Social History   Socioeconomic History   Marital status: Single    Spouse name: Not on file   Number of children: Not on file   Years of education: Not on file   Highest education level: Not on file  Occupational History   Not on file  Tobacco Use   Smoking status: Never   Smokeless tobacco: Never  Vaping Use   Vaping Use: Never used  Substance and Sexual Activity   Alcohol use: No   Drug use: No   Sexual activity: Never    Comment: 1st intercourse- 76,  partners- 2   Other Topics Concern   Not on file  Social History Narrative   Not on file   Social Determinants of Health   Financial Resource Strain: Not on file  Food Insecurity: Not on file  Transportation Needs: Not on file  Physical Activity: Not on file  Stress: Not on file  Social Connections: Not on file     Family History: The patient's family history includes Breast cancer in her niece; Cancer in her brother and mother; Diabetes in her brother, mother, and sister; Hypertension in her brother and mother.  ROS:   Please see the history of present illness.    Multiple  concerns and anxiety about her overall health.  All other systems reviewed and are negative.  EKGs/Labs/Other Studies Reviewed:    The following studies were reviewed today:  2 D Doppler ECHOCARDIOGRAM 06/29/2022: IMPRESSIONS   1. Left ventricular ejection fraction, by estimation, is 60 to 65%. The  left ventricle has normal function. The left ventricle has no regional  wall motion abnormalities. Left ventricular diastolic parameters were  normal.   2. Right ventricular systolic function is normal. The right ventricular  size is normal. There is normal pulmonary artery systolic pressure.   3. No evidence of mitral valve regurgitation.   4. The aortic valve is tricuspid. Aortic  valve regurgitation is not  visualized.   5. The inferior vena cava is normal in size with greater than 50%  respiratory variability, suggesting right atrial pressure of 3 mmHg.   EKG:  EKG normal sinus rhythm at 70 bpm.  Left atrial abnormality.  Poor R wave progression with early precordial symmetrical T wave inversion.  Consistent with prior infarct.  Left axis deviation.  There is no significant change when compared to January 2023.  Recent Labs: 08/21/2021: BUN 14; Potassium 4.4; Sodium 141 12/07/2021: Creatinine, Ser 1.30  Recent Lipid Panel    Component Value Date/Time   CHOL 134 05/11/2022 1028   TRIG 138 05/11/2022 1028   HDL 42 05/11/2022 1028   CHOLHDL 3.2 05/11/2022 1028   CHOLHDL 4.4 12/12/2015 0828   VLDL 29 12/12/2015 0828   LDLCALC 68 05/11/2022 1028   LDLDIRECT 149.1 05/28/2013 0946    Physical Exam:    VS:  BP (!) 170/92   Pulse 70   Ht '5\' 2"'$  (1.575 m)   Wt 187 lb (84.8 kg)   SpO2 99%   BMI 34.20 kg/m     Wt Readings from Last 3 Encounters:  07/22/22 187 lb (84.8 kg)  05/24/22 190 lb (86.2 kg)  08/21/21 190 lb (86.2 kg)     GEN: Overweight. No acute distress HEENT: Normal NECK: No JVD. LYMPHATICS: No lymphadenopathy CARDIAC: No murmur. RRR no gallop, or edema. VASCULAR:   Normal Pulses. No bruits. RESPIRATORY:  Clear to auscultation without rales, wheezing or rhonchi  ABDOMEN: Soft, non-tender, non-distended, No pulsatile mass, MUSCULOSKELETAL: No deformity  SKIN: Warm and dry NEUROLOGIC:  Alert and oriented x 3 PSYCHIATRIC:  Normal affect   ASSESSMENT:    1. CAD of autologous bypass graft   2. PAD (peripheral artery disease) ([place])   3. Dyslipidemia, goal LDL below 70   4. Essential hypertension   5. OSA on CPAP   6. Myalgia due to statin    PLAN:    In order of problems listed above:  Secondary prevention was discussed.  Continue same therapy. Secondary prevention discussed. Continue in the advanced lipid clinic with Dr. Debara Pickett.  Also recommended that Dr. Debara Pickett become her general cardiologist with which she agrees. She has not had a blood pressure medication yet today.  I recommend that she take the Synthroid at night.  States that she has not had her meds yet today because she takes Synthroid initially and has a wait 4 hours before she takes her other medication.  Going forward she will take her antihypertensive therapy in the morning and moved his Synthroid to p.m. dosing. Encouraged CPAP use Encouraged her to continue using PCSK9 therapy   Medication Adjustments/Labs and Tests Ordered: Current medicines are reviewed at length with the patient today.  Concerns regarding medicines are outlined above.  No orders of the defined types were placed in this encounter.  No orders of the defined types were placed in this encounter.   There are no Patient Instructions on file for this visit.   Signed, [name], MD  07/22/2022 10:10 AM    [place]

## 2022-07-21 DIAGNOSIS — N281 Cyst of kidney, acquired: Secondary | ICD-10-CM | POA: Diagnosis not present

## 2022-07-21 DIAGNOSIS — N1831 Chronic kidney disease, stage 3a: Secondary | ICD-10-CM | POA: Diagnosis not present

## 2022-07-21 DIAGNOSIS — K760 Fatty (change of) liver, not elsewhere classified: Secondary | ICD-10-CM | POA: Diagnosis not present

## 2022-07-22 ENCOUNTER — Ambulatory Visit: Payer: Medicare Other | Attending: Interventional Cardiology | Admitting: Interventional Cardiology

## 2022-07-22 ENCOUNTER — Encounter: Payer: Self-pay | Admitting: Interventional Cardiology

## 2022-07-22 VITALS — BP 170/92 | HR 70 | Ht 62.0 in | Wt 187.0 lb

## 2022-07-22 DIAGNOSIS — I739 Peripheral vascular disease, unspecified: Secondary | ICD-10-CM | POA: Diagnosis not present

## 2022-07-22 DIAGNOSIS — T466X5A Adverse effect of antihyperlipidemic and antiarteriosclerotic drugs, initial encounter: Secondary | ICD-10-CM | POA: Diagnosis not present

## 2022-07-22 DIAGNOSIS — M791 Myalgia, unspecified site: Secondary | ICD-10-CM

## 2022-07-22 DIAGNOSIS — E785 Hyperlipidemia, unspecified: Secondary | ICD-10-CM | POA: Diagnosis not present

## 2022-07-22 DIAGNOSIS — G4733 Obstructive sleep apnea (adult) (pediatric): Secondary | ICD-10-CM

## 2022-07-22 DIAGNOSIS — I25810 Atherosclerosis of coronary artery bypass graft(s) without angina pectoris: Secondary | ICD-10-CM | POA: Diagnosis not present

## 2022-07-22 DIAGNOSIS — I10 Essential (primary) hypertension: Secondary | ICD-10-CM | POA: Diagnosis not present

## 2022-07-22 DIAGNOSIS — T466X5D Adverse effect of antihyperlipidemic and antiarteriosclerotic drugs, subsequent encounter: Secondary | ICD-10-CM

## 2022-07-22 NOTE — Patient Instructions (Signed)
Medication Instructions:  Your physician recommends that you continue on your current medications as directed. Please refer to the Current Medication list given to you today.  *If you need a refill on your cardiac medications before your next appointment, please call your pharmacy*  Follow-Up: At [facility], you and your health needs are our priority.  As part of our continuing mission to provide you with exceptional heart care, we have created designated Provider Care Teams.  These Care Teams include your primary Cardiologist (physician) and Advanced Practice Providers (APPs -  Physician Assistants and Nurse Practitioners) who all work together to provide you with the care you need, when you need it.  Your next appointment:   9-12 month(s)  The format for your next appointment:   In Person  Provider:   Lyman Bishop, MD  Important Information About Sugar

## 2022-07-26 DIAGNOSIS — H401122 Primary open-angle glaucoma, left eye, moderate stage: Secondary | ICD-10-CM | POA: Diagnosis not present

## 2022-07-26 DIAGNOSIS — H401113 Primary open-angle glaucoma, right eye, severe stage: Secondary | ICD-10-CM | POA: Diagnosis not present

## 2022-07-29 DIAGNOSIS — I10 Essential (primary) hypertension: Secondary | ICD-10-CM | POA: Diagnosis not present

## 2022-07-29 DIAGNOSIS — G4733 Obstructive sleep apnea (adult) (pediatric): Secondary | ICD-10-CM | POA: Diagnosis not present

## 2022-07-29 DIAGNOSIS — Z6833 Body mass index (BMI) 33.0-33.9, adult: Secondary | ICD-10-CM | POA: Diagnosis not present

## 2022-08-02 ENCOUNTER — Telehealth: Payer: Self-pay | Admitting: Interventional Cardiology

## 2022-08-02 MED ORDER — PRALUENT 150 MG/ML ~~LOC~~ SOAJ: 1.0000 | SUBCUTANEOUS | 3 refills | Status: DC

## 2022-08-02 NOTE — Telephone Encounter (Signed)
Pt c/o medication issue:  1. Name of Medication:   Alirocumab (PRALUENT) 150 MG/ML SOAJ    2. How are you currently taking this medication (dosage and times per day)? Inject 1 Dose into the skin every 14 (fourteen) days.   3. Are you having a reaction (difficulty breathing--STAT)? No  4. What is your medication issue? Pt states that she was approved for medication from 07/2022 to 07/2023. Please advise

## 2022-08-02 NOTE — Telephone Encounter (Signed)
Pt already on Praluent, she received renewal notification. She doesn't need to do anything aside from continue on her med. She is aware.

## 2022-08-03 ENCOUNTER — Other Ambulatory Visit (HOSPITAL_COMMUNITY): Payer: Medicare Other

## 2022-08-19 DIAGNOSIS — Z6834 Body mass index (BMI) 34.0-34.9, adult: Secondary | ICD-10-CM | POA: Diagnosis not present

## 2022-08-19 DIAGNOSIS — Z01419 Encounter for gynecological examination (general) (routine) without abnormal findings: Secondary | ICD-10-CM | POA: Diagnosis not present

## 2022-08-20 ENCOUNTER — Ambulatory Visit (INDEPENDENT_AMBULATORY_CARE_PROVIDER_SITE_OTHER): Payer: Medicare Other | Admitting: Primary Care

## 2022-08-20 ENCOUNTER — Other Ambulatory Visit (HOSPITAL_COMMUNITY): Payer: Medicare Other

## 2022-08-20 DIAGNOSIS — Z1231 Encounter for screening mammogram for malignant neoplasm of breast: Secondary | ICD-10-CM | POA: Diagnosis not present

## 2022-08-26 ENCOUNTER — Telehealth: Payer: Self-pay

## 2022-08-26 NOTE — Telephone Encounter (Signed)
Ok with me, but would need to be ok with Dr Sharlet Salina since she already has an appt sched in June 2024

## 2022-08-26 NOTE — Telephone Encounter (Signed)
Pt states that Dr. Merrily Brittle has referred her to be seen by Dr. Jenny Reichmann. Pt called asking if she can be taken on as a new pt. If so please call the pt at [phone] to schedule her.

## 2022-08-27 NOTE — Telephone Encounter (Signed)
Is not my patient so can pick whomever she wants taking new patients. If establishing with Dr. Jenny Reichmann please cancel np apt with me

## 2022-08-27 NOTE — Telephone Encounter (Signed)
Called pt no answer LMOM RTC../lb 

## 2022-08-27 NOTE — Telephone Encounter (Signed)
Is this ok.. pt is wanting to transfer to Dr. Jenny Reichmann.Carolyn Fleming

## 2022-08-30 NOTE — Telephone Encounter (Signed)
Pt call back made appt w/ Dr. Jenny Reichmann 09/08/22.Marland KitchenJohny Chess

## 2022-09-08 ENCOUNTER — Telehealth: Payer: Self-pay | Admitting: Internal Medicine

## 2022-09-08 ENCOUNTER — Encounter: Payer: Self-pay | Admitting: Internal Medicine

## 2022-09-08 ENCOUNTER — Other Ambulatory Visit: Payer: Self-pay

## 2022-09-08 ENCOUNTER — Ambulatory Visit (INDEPENDENT_AMBULATORY_CARE_PROVIDER_SITE_OTHER): Payer: Medicare Other | Admitting: Internal Medicine

## 2022-09-08 VITALS — BP 136/82 | HR 72 | Temp 98.0°F | Ht 62.0 in | Wt 188.0 lb

## 2022-09-08 DIAGNOSIS — E119 Type 2 diabetes mellitus without complications: Secondary | ICD-10-CM | POA: Insufficient documentation

## 2022-09-08 DIAGNOSIS — I10 Essential (primary) hypertension: Secondary | ICD-10-CM

## 2022-09-08 DIAGNOSIS — J3089 Other allergic rhinitis: Secondary | ICD-10-CM | POA: Diagnosis not present

## 2022-09-08 DIAGNOSIS — E785 Hyperlipidemia, unspecified: Secondary | ICD-10-CM

## 2022-09-08 DIAGNOSIS — M1711 Unilateral primary osteoarthritis, right knee: Secondary | ICD-10-CM | POA: Insufficient documentation

## 2022-09-08 DIAGNOSIS — Z1159 Encounter for screening for other viral diseases: Secondary | ICD-10-CM

## 2022-09-08 DIAGNOSIS — E1165 Type 2 diabetes mellitus with hyperglycemia: Secondary | ICD-10-CM

## 2022-09-08 MED ORDER — MONTELUKAST SODIUM 10 MG PO TABS: 10.0000 mg | ORAL_TABLET | Freq: Every day | ORAL | 3 refills | Status: DC

## 2022-09-08 MED ORDER — AZELASTINE HCL 0.15 % NA SOLN: NASAL | 5 refills | Status: AC

## 2022-09-08 MED ORDER — LOSARTAN POTASSIUM-HCTZ 50-12.5 MG PO TABS: ORAL_TABLET | ORAL | 3 refills | Status: DC

## 2022-09-08 MED ORDER — AZELASTINE HCL 0.15 % NA SOLN: NASAL | 5 refills | Status: DC

## 2022-09-08 MED ORDER — AMLODIPINE BESYLATE 5 MG PO TABS: 5.0000 mg | ORAL_TABLET | Freq: Every day | ORAL | 3 refills | Status: DC

## 2022-09-08 MED ORDER — FEXOFENADINE HCL 180 MG PO TABS: 180.0000 mg | ORAL_TABLET | Freq: Every day | ORAL | 3 refills | Status: DC

## 2022-09-08 NOTE — Patient Instructions (Signed)
Please take all new medication as prescribed - the singulair, and allegra  Please continue all other medications as before, and refills have been done if requested - the Astelin  Please have the pharmacy call with any other refills you may need.  Please continue your efforts at being more active, low cholesterol diet, and weight control.  Please keep your appointments with your specialists as you may have planned  Please make an Appointment to return in 3 months, or sooner if needed

## 2022-09-08 NOTE — Progress Notes (Signed)
Patient ID: Carolyn Fleming, female   DOB: August 19, 1950, 72 y.o.   MRN: [id number]         Chief Complaint:: yearly exam as new pt       HPI:  Carolyn Fleming is a 72 y.o. female here overall doing ok, except Does have several wks ongoing nasal allergy symptoms with clearish congestion, itch and sneezing, without fever, pain, ST, cough, swelling or wheezing.  Needs to restart astelin, and asks for singulair and antihistamine since claritin not working.  Did recently aslo had new elevated A1c 7.1 in oct 2023, declines OHA for now.  Pt denies chest pain, increased sob or doe, wheezing, orthopnea, PND, increased LE swelling, palpitations, dizziness or syncope.   Pt denies polydipsia, polyuria, or new focal neuro s/s.    Pt denies fever, wt loss, night sweats, loss of appetite, or other constitutional symptoms   Wt Readings from Last 3 Encounters:  09/08/22 188 lb (85.3 kg)  07/22/22 187 lb (84.8 kg)  05/24/22 190 lb (86.2 kg)   BP Readings from Last 3 Encounters:  09/08/22 136/82  07/22/22 (!) 170/92  05/24/22 128/82   Immunization History  Administered Date(s) Administered   PFIZER Comirnaty(Gray Top)Covid-19 Tri-Sucrose Vaccine 12/29/2020   PFIZER(Purple Top)SARS-COV-2 Vaccination 09/20/2019, 10/11/2019, 05/28/2020   Unspecified SARS-COV-2 Vaccination 09/20/2019, 10/11/2019   Health Maintenance Due  Topic Date Due   Medicare Annual Wellness (AWV)  Never done   FOOT EXAM  Never done   OPHTHALMOLOGY EXAM  Never done   Diabetic kidney evaluation - Urine ACR  Never done   Hepatitis C Screening  Never done   HEMOGLOBIN A1C  07/27/2022   Diabetic kidney evaluation - eGFR measurement  08/21/2022      Past Medical History:  Diagnosis Date   Arthritis    Asthma    Breast cancer ([place])    Right breast    GERD (gastroesophageal reflux disease)    Glaucoma    Heart disease    Hyperlipidemia    Hypertension 01/25/2012   Obesity (BMI 35.0-39.9 without comorbidity) 01/25/2012   S/P CABG  x 2 01/24/2012   Emergency LIMA to LAD, SVG to OM1, EVH via right thigh   Sleep apnea    uses cpap   Thyroid disease    Past Surgical History:  Procedure Laterality Date   ABDOMINAL HYSTERECTOMY     BREAST LUMPECTOMY  1997   breast cancer   CARDIAC CATHETERIZATION N/A 05/07/2015   Procedure: Left Heart Cath and Cors/Grafts Angiography;  Surgeon: [name], MD;  Location: [facility];  Service: Cardiovascular;  Laterality: N/A;   CHOLECYSTECTOMY     CORONARY ARTERY BYPASS GRAFT  01/24/2012   Procedure: CORONARY ARTERY BYPASS GRAFTING (CABG);  Surgeon: [name], MD;  Location: [facility];  Service: Open Heart Surgery;  Laterality: N/A;  Coronary Artery bypass Graft times two utilizing the left internal mammary artery and the right greater saphenous vein harvested endoscopically,  Transesophageal Echocardiogram   LEFT HEART CATHETERIZATION WITH CORONARY ANGIOGRAM N/A 01/24/2012   Procedure: LEFT HEART CATHETERIZATION WITH CORONARY ANGIOGRAM;  Surgeon: [name], MD;  Location: [facility];  Service: Cardiovascular;  Laterality: N/A;   TONSILLECTOMY     TUBAL LIGATION      reports that she has never smoked. She has never used smokeless tobacco. She reports that she does not drink alcohol and does not use drugs. family history includes Breast cancer in her niece; Cancer in her brother  and mother; Diabetes in her brother, mother, and sister; Hypertension in her brother and mother. Allergies  Allergen Reactions   Acetazolamide Other (See Comments)   Amoxicillin Er Other (See Comments)   Augmentin [Amoxicillin-Pot Clavulanate]     Heart racing   Codeine Other (See Comments)    Lethargy   Fluticasone Other (See Comments)   Iodine Other (See Comments)    Per allergy test   Latex     REACTION: hives   Levofloxacin Other (See Comments)   Lisinopril Other (See Comments)   Meloxicam Other (See Comments)   Propofol Other (See Comments)   Statins Other (See Comments)     Atorvastatin, rosuvastatin   Sulfonamide Derivatives     REACTION: gi upset   Zyrtec [Cetirizine] Other (See Comments)    Night terrors   Current Outpatient Medications on File Prior to Visit  Medication Sig Dispense Refill   Alirocumab (PRALUENT) 150 MG/ML SOAJ Inject 1 Dose into the skin every 14 (fourteen) days. 6 mL 3   amLODipine (NORVASC) 5 MG tablet TAKE 1 TABLET BY MOUTH EVERY DAY 90 tablet 3   aspirin 81 MG chewable tablet Chew 81 mg by mouth daily.     diazepam (VALIUM) 5 MG tablet Take 1 tablet (5 mg total) by mouth every 8 (eight) hours as needed (dizziness). 30 tablet 0   Dorzolamide HCl-Timolol Mal PF 2-0.5 % SOLN Place 2 % into the left eye in the morning and at bedtime.      hydrocortisone cream 1 % Apply 1 application topically as needed for itching.     ketoconazole (NIZORAL) 2 % cream Apply 1 application topically as needed for irritation.     levothyroxine (SYNTHROID) 50 MCG tablet Take 50 mcg by mouth daily.     losartan-hydrochlorothiazide (HYZAAR) 50-12.5 MG tablet TAKE 1 TABLET DAILY. PLEASE KEEP UPCOMING APPT WITH DR. [name] IN NOVEMBER FOR FUTURE REFILL 90 tablet 3   meclizine (ANTIVERT) 12.5 MG tablet Take 1 tablet (12.5 mg total) by mouth 3 (three) times daily as needed for dizziness. 90 tablet 0   nitrofurantoin, macrocrystal-monohydrate, (MACROBID) 100 MG capsule Take 100 mg by mouth 2 (two) times daily.     nitroGLYCERIN (NITROSTAT) 0.4 MG SL tablet 0.4 mg every 5 (five) minutes as needed for chest pain.     ondansetron (ZOFRAN) 8 MG tablet TAKE 1 TABLET BY MOUTH TWICE A DAY AS NEEDED FOR NAUSEA  1   predniSONE (DELTASONE) 10 MG tablet Take 10 mg by mouth as needed.     promethazine (PHENERGAN) 25 MG tablet Take 25 mg by mouth every 4 (four) hours as needed.     Travoprost, BAK Free, (TRAVATAN) 0.004 % SOLN ophthalmic solution Place 1 drop into the left eye.      No current facility-administered medications on file prior to visit.        ROS:  All others  reviewed and negative.  Objective        PE:  BP 136/82 (BP Location: Left Arm, Patient Position: Sitting, Cuff Size: Large)   Pulse 72   Temp 98 F (36.7 C) (Oral)   Ht '5\' 2"'$  (1.575 m)   Wt 188 lb (85.3 kg)   SpO2 98%   BMI 34.39 kg/m                 Constitutional: Pt appears in NAD               HENT: Head: NCAT.  Right Ear: External ear normal.                 Left Ear: External ear normal.  Bilat tm's with mild erythema.  Max sinus areas non tender.  Pharynx with mild erythema, no exudate               Eyes: . Pupils are equal, round, and reactive to light. Conjunctivae and EOM are normal               Nose: without d/c or deformity               Neck: Neck supple. Gross normal ROM               Cardiovascular: Normal rate and regular rhythm.                 Pulmonary/Chest: Effort normal and breath sounds without rales or wheezing.                Abd:  Soft, NT, ND, + BS, no organomegaly               Neurological: Pt is alert. At baseline orientation, motor grossly intact               Skin: Skin is warm. No rashes, no other new lesions, LE edema - none               Psychiatric: Pt behavior is normal without agitation   Micro: none  Cardiac tracings I have personally interpreted today:  none  Pertinent Radiological findings (summarize): none   Lab Results  Component Value Date   WBC 4.4 05/02/2015   HGB 14.1 05/02/2015   HCT 42.7 05/02/2015   PLT 219.0 05/02/2015   GLUCOSE 147 (H) 08/21/2021   CHOL 134 05/11/2022   TRIG 138 05/11/2022   HDL 42 05/11/2022   LDLDIRECT 149.1 05/28/2013   LDLCALC 68 05/11/2022   ALT 21 05/30/2019   AST 18 05/30/2019   NA 141 08/21/2021   K 4.4 08/21/2021   CL 105 08/21/2021   CREATININE 1.30 (H) 12/07/2021   BUN 14 08/21/2021   CO2 25 08/21/2021   TSH 3.07 07/21/2020   INR 1.2 (H) 05/02/2015   HGBA1C 7.9 (H) 01/25/2022   Assessment/Plan:  Ataya Murdy is a 72 y.o. Black or African American [2] female  with  has a past medical history of Arthritis, Asthma, Breast cancer ([place]), GERD (gastroesophageal reflux disease), Glaucoma, Heart disease, Hyperlipidemia, Hypertension (01/25/2012), Obesity (BMI 35.0-39.9 without comorbidity) (01/25/2012), S/P CABG x 2 (01/24/2012), Sleep apnea, and Thyroid disease.  Diabetes ([place]) Lab Results  Component Value Date   HGBA1C 7.9 (H) 01/25/2022   Uncontrolled,, pt to continue current medical treatment  - diet wt control, declines OHA for now, but wants to work on lifestyle and wt loss, diet and recheck in 3 mo   Hyperlipidemia Lab Results  Component Value Date   [place] 68 05/11/2022   Stable, pt to continue current statin  - diet, wt control, declines statin for now   Hypertension BP Readings from Last 3 Encounters:  09/08/22 136/82  07/22/22 (!) 170/92  05/24/22 128/82   Stable, pt to continue medical treatment norvasc 5 mg qd, hyzaar 50-12.5 qd   Perennial allergic rhinitis Mild to mod worsening, for restart astelin, add singulair 10 mg qd, and change claritin to allegra 1`80 qd,  to f/u any worsening symptoms or concerns  Followup: Return in about  3 months (around 12/08/2022).  Cathlean Cower, MD 09/08/2022 10:05 AM [facility]

## 2022-09-08 NOTE — Assessment & Plan Note (Addendum)
Lab Results  Component Value Date   LDLCALC 68 05/11/2022   Stable, pt to continue current statin  - diet, wt control, declines statin for now

## 2022-09-08 NOTE — Telephone Encounter (Signed)
RX sent to pharmacy.   Previous patient of Dr.Guardino, recently seen 07/2022.   Follow up with Dr.Hilty due in 12 months.   Thank you!

## 2022-09-08 NOTE — Telephone Encounter (Signed)
Pt c/o medication issue:  1. Name of Medication:   amLODipine (NORVASC) 5 MG tablet    losartan-hydrochlorothiazide (HYZAAR) 50-12.5 MG tablet    2. How are you currently taking this medication (dosage and times per day)?   3. Are you having a reaction (difficulty breathing--STAT)? No  4. What is your medication issue? Pt needs for new prescriptions for above medications to be sent to pharmacy. Please advise

## 2022-09-08 NOTE — Assessment & Plan Note (Signed)
Lab Results  Component Value Date   HGBA1C 7.9 (H) 01/25/2022   Uncontrolled,, pt to continue current medical treatment  - diet wt control, declines OHA for now, but wants to work on lifestyle and wt loss, diet and recheck in 3 mo

## 2022-09-08 NOTE — Telephone Encounter (Signed)
Patient called and asked that you send the Azelastine HCI 0.15% again.  Pharmacy states that they did not receive this rx

## 2022-09-08 NOTE — Assessment & Plan Note (Signed)
Mild to mod worsening, for restart astelin, add singulair 10 mg qd, and change claritin to allegra 1`80 qd,  to f/u any worsening symptoms or concerns

## 2022-09-08 NOTE — Assessment & Plan Note (Signed)
BP Readings from Last 3 Encounters:  09/08/22 136/82  07/22/22 (!) 170/92  05/24/22 128/82   Stable, pt to continue medical treatment norvasc 5 mg qd, hyzaar 50-12.5 qd

## 2022-09-09 LAB — HEPATITIS C ANTIBODY: Hepatitis C Ab: NONREACTIVE

## 2022-09-20 ENCOUNTER — Telehealth: Payer: Self-pay | Admitting: Internal Medicine

## 2022-09-20 MED ORDER — AZITHROMYCIN 250 MG PO TABS: ORAL_TABLET | ORAL | 1 refills | Status: AC

## 2022-09-20 NOTE — Telephone Encounter (Signed)
Patient would like a prescription for something to help with her sinus infection. Best callback is [phone].

## 2022-09-20 NOTE — Telephone Encounter (Signed)
Notified pt MD sent zpac to [facility].Marland KitchenJohny Chess

## 2022-09-20 NOTE — Telephone Encounter (Signed)
Called pt gave MD response. Pt states she was just here on 1/24, and she told MD that she had sinus infection. He changed her allergy meds but didn't rx nothing for sinus infection...Carolyn Fleming

## 2022-09-20 NOTE — Telephone Encounter (Signed)
[facility] for antibx - done erx

## 2022-09-20 NOTE — Telephone Encounter (Signed)
Needs rov please

## 2022-10-07 DIAGNOSIS — E039 Hypothyroidism, unspecified: Secondary | ICD-10-CM | POA: Diagnosis not present

## 2022-11-16 DIAGNOSIS — L309 Dermatitis, unspecified: Secondary | ICD-10-CM | POA: Insufficient documentation

## 2022-11-16 DIAGNOSIS — N183 Chronic kidney disease, stage 3 unspecified: Secondary | ICD-10-CM | POA: Insufficient documentation

## 2022-11-16 DIAGNOSIS — M1990 Unspecified osteoarthritis, unspecified site: Secondary | ICD-10-CM | POA: Insufficient documentation

## 2022-11-16 DIAGNOSIS — I5042 Chronic combined systolic (congestive) and diastolic (congestive) heart failure: Secondary | ICD-10-CM | POA: Insufficient documentation

## 2022-11-16 DIAGNOSIS — I2510 Atherosclerotic heart disease of native coronary artery without angina pectoris: Secondary | ICD-10-CM | POA: Insufficient documentation

## 2022-11-16 DIAGNOSIS — K219 Gastro-esophageal reflux disease without esophagitis: Secondary | ICD-10-CM | POA: Insufficient documentation

## 2022-11-16 DIAGNOSIS — J321 Chronic frontal sinusitis: Secondary | ICD-10-CM | POA: Insufficient documentation

## 2022-11-16 DIAGNOSIS — R141 Gas pain: Secondary | ICD-10-CM | POA: Insufficient documentation

## 2022-11-16 DIAGNOSIS — R7303 Prediabetes: Secondary | ICD-10-CM | POA: Insufficient documentation

## 2022-11-16 DIAGNOSIS — E039 Hypothyroidism, unspecified: Secondary | ICD-10-CM | POA: Insufficient documentation

## 2022-11-16 DIAGNOSIS — M47817 Spondylosis without myelopathy or radiculopathy, lumbosacral region: Secondary | ICD-10-CM | POA: Insufficient documentation

## 2022-11-16 DIAGNOSIS — M722 Plantar fascial fibromatosis: Secondary | ICD-10-CM | POA: Insufficient documentation

## 2022-11-16 DIAGNOSIS — M5430 Sciatica, unspecified side: Secondary | ICD-10-CM | POA: Insufficient documentation

## 2022-11-16 DIAGNOSIS — J329 Chronic sinusitis, unspecified: Secondary | ICD-10-CM | POA: Insufficient documentation

## 2022-11-16 DIAGNOSIS — Z800 Family history of malignant neoplasm of digestive organs: Secondary | ICD-10-CM | POA: Insufficient documentation

## 2022-11-16 DIAGNOSIS — J449 Chronic obstructive pulmonary disease, unspecified: Secondary | ICD-10-CM | POA: Insufficient documentation

## 2022-11-16 DIAGNOSIS — I209 Angina pectoris, unspecified: Secondary | ICD-10-CM | POA: Insufficient documentation

## 2022-11-16 DIAGNOSIS — I739 Peripheral vascular disease, unspecified: Secondary | ICD-10-CM | POA: Insufficient documentation

## 2022-11-16 DIAGNOSIS — F419 Anxiety disorder, unspecified: Secondary | ICD-10-CM | POA: Insufficient documentation

## 2022-11-17 ENCOUNTER — Encounter: Payer: Self-pay | Admitting: Nurse Practitioner

## 2022-11-17 ENCOUNTER — Ambulatory Visit: Payer: Medicare Other | Admitting: Nurse Practitioner

## 2022-11-17 VITALS — BP 158/92 | HR 74 | Temp 97.9°F | Ht 62.0 in | Wt 187.0 lb

## 2022-11-17 DIAGNOSIS — E782 Mixed hyperlipidemia: Secondary | ICD-10-CM | POA: Diagnosis not present

## 2022-11-17 DIAGNOSIS — E1151 Type 2 diabetes mellitus with diabetic peripheral angiopathy without gangrene: Secondary | ICD-10-CM | POA: Diagnosis not present

## 2022-11-17 DIAGNOSIS — R7309 Other abnormal glucose: Secondary | ICD-10-CM

## 2022-11-17 DIAGNOSIS — I119 Hypertensive heart disease without heart failure: Secondary | ICD-10-CM

## 2022-11-17 DIAGNOSIS — G720 Drug-induced myopathy: Secondary | ICD-10-CM | POA: Diagnosis not present

## 2022-11-17 DIAGNOSIS — N1831 Chronic kidney disease, stage 3a: Secondary | ICD-10-CM

## 2022-11-17 DIAGNOSIS — G4730 Sleep apnea, unspecified: Secondary | ICD-10-CM | POA: Diagnosis not present

## 2022-11-17 DIAGNOSIS — I10 Essential (primary) hypertension: Secondary | ICD-10-CM

## 2022-11-17 DIAGNOSIS — I1310 Hypertensive heart and chronic kidney disease without heart failure, with stage 1 through stage 4 chronic kidney disease, or unspecified chronic kidney disease: Secondary | ICD-10-CM

## 2022-11-17 DIAGNOSIS — Z853 Personal history of malignant neoplasm of breast: Secondary | ICD-10-CM

## 2022-11-17 DIAGNOSIS — J302 Other seasonal allergic rhinitis: Secondary | ICD-10-CM | POA: Diagnosis not present

## 2022-11-17 DIAGNOSIS — I252 Old myocardial infarction: Secondary | ICD-10-CM | POA: Diagnosis not present

## 2022-11-17 DIAGNOSIS — E1122 Type 2 diabetes mellitus with diabetic chronic kidney disease: Secondary | ICD-10-CM

## 2022-11-17 DIAGNOSIS — E039 Hypothyroidism, unspecified: Secondary | ICD-10-CM

## 2022-11-17 DIAGNOSIS — T466X5A Adverse effect of antihyperlipidemic and antiarteriosclerotic drugs, initial encounter: Secondary | ICD-10-CM | POA: Diagnosis not present

## 2022-11-17 DIAGNOSIS — I25708 Atherosclerosis of coronary artery bypass graft(s), unspecified, with other forms of angina pectoris: Secondary | ICD-10-CM | POA: Diagnosis not present

## 2022-11-17 DIAGNOSIS — I739 Peripheral vascular disease, unspecified: Secondary | ICD-10-CM

## 2022-11-17 DIAGNOSIS — Z789 Other specified health status: Secondary | ICD-10-CM | POA: Diagnosis not present

## 2022-11-17 DIAGNOSIS — Z9989 Dependence on other enabling machines and devices: Secondary | ICD-10-CM

## 2022-11-17 NOTE — Progress Notes (Addendum)
I,Sheena H Holbrook,acting as a Neurosurgeon for Arnette Felts, FNP.,have documented all relevant documentation on the behalf of Arnette Felts, FNP,as directed by  Arnette Felts, FNP while in the presence of Arnette Felts, FNP.   Subjective:     Patient ID: Carolyn Fleming , female    DOB: 1951-07-29 , 72 y.o.   MRN: [id number]   Chief Complaint  Patient presents with   Establish Care    HPI  Patient presents today to establish care. She seen Dr. Oliver Barre last for a sinus infection - 2 months ago.  She had also seen a provider in [place] and she was unhappy - she has a cardiologist takes care of her BP - it was Dr. Verdis Prime but he is retiring - and she is to be referred to Dr. Rennis Golden who takes care of her for her Praluent injections. 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 Single. She has one son - lives in [place].   Patient has seen multiple primary care providers in the past year. Patient has not been happy with her previous providers. Patient reports her last PCP visit was several months ago, was seen for a sinus infection.   She is followed by Dr. Bosie Clos - due for colonoscopy next year She is followed by Dr. Dalbert Mayotte in November - checked her thyroid levels and was abnormal. She is currently back on thyroid medications. She has just started back feeling better at the end of March.   She had breast cancer in 1997 right breast 4 - treatment with chemotherapy. She had her last mammogram done at Dr. Dalbert Mayotte. Right eye with glaucoma - she had elevated pressure and she pulled the suture - she is scheduled to see Dr. Lottie Dawson. She has sleep apnea and uses a CPAP nightly - was seeing Dr Earl Gala who has since retired. She has been to Dr. Rich Reining and had been cleared.      Past Medical History:  Diagnosis Date   Arthritis    Asthma    Breast cancer (HCC)    Right breast    GERD (gastroesophageal reflux disease)    Glaucoma    Heart  disease    Hyperlipidemia    Hypertension 01/25/2012   Obesity (BMI 35.0-39.9 without comorbidity) 01/25/2012   S/P CABG x 2 01/24/2012   Emergency LIMA to LAD, SVG to OM1, EVH via right thigh   Sleep apnea    uses cpap   Thyroid disease      Family History  Problem Relation Age of Onset   Cancer Mother        Colon   Diabetes Mother    Hypertension Mother    Diabetes Sister    Cancer Brother        Lung   Diabetes Brother    Hypertension Brother    Breast cancer Niece        30s     Current Outpatient Medications:    Alirocumab (PRALUENT) 150 MG/ML SOAJ, Inject 1 Dose into the skin every 14 (fourteen) days., Disp: 6 mL, Rfl: 3   aspirin 81 MG chewable tablet, Chew 81 mg by mouth daily., Disp: , Rfl:    Azelastine HCl 0.15 % SOLN, azelastine 0.15 % (205.5 mcg) nasal spray  USE 1 SPRAY INTO EACH NOSTRIL TWICE A DAY, Disp: 30 mL, Rfl: 5   diazepam (VALIUM) 5 MG tablet, Take 1 tablet (5 mg total) by mouth every  8 (eight) hours as needed (dizziness)., Disp: 30 tablet, Rfl: 0   Dorzolamide HCl-Timolol Mal PF 2-0.5 % SOLN, Place 2 % into the left eye in the morning and at bedtime. , Disp: , Rfl:    hydrocortisone cream 1 %, Apply 1 application topically as needed for itching., Disp: , Rfl:    ketoconazole (NIZORAL) 2 % cream, Apply 1 application topically as needed for irritation., Disp: , Rfl:    levothyroxine (SYNTHROID) 50 MCG tablet, Take 50 mcg by mouth daily., Disp: , Rfl:    meclizine (ANTIVERT) 12.5 MG tablet, Take 1 tablet (12.5 mg total) by mouth 3 (three) times daily as needed for dizziness., Disp: 90 tablet, Rfl: 0   nitroGLYCERIN (NITROSTAT) 0.4 MG SL tablet, 0.4 mg every 5 (five) minutes as needed for chest pain., Disp: , Rfl:    ondansetron (ZOFRAN) 8 MG tablet, TAKE 1 TABLET BY MOUTH TWICE A DAY AS NEEDED FOR NAUSEA, Disp: , Rfl: 1   promethazine (PHENERGAN) 25 MG tablet, Take 25 mg by mouth every 4 (four) hours as needed., Disp: , Rfl:    Travoprost, BAK Free,  (TRAVATAN) 0.004 % SOLN ophthalmic solution, Place 1 drop into the left eye. , Disp: , Rfl:    amLODipine (NORVASC) 5 MG tablet, Take 1 tablet (5 mg total) by mouth daily., Disp: 90 tablet, Rfl: 3   fexofenadine (ALLEGRA ALLERGY) 60 MG tablet, Take 1 tablet (60 mg total) by mouth 2 (two) times daily., Disp: 30 tablet, Rfl: 5   losartan-hydrochlorothiazide (HYZAAR) 100-12.5 MG tablet, Take 1 tablet by mouth daily., Disp: 90 tablet, Rfl: 3   Allergies  Allergen Reactions   Pilocarpine     Other Reaction(s): Other (See Comments)  Reaction: Chills (intolerance)   Acetazolamide Other (See Comments)   Allegra [Fexofenadine] Other (See Comments)    hallucinations   Amoxicillin Er Other (See Comments)   Augmentin [Amoxicillin-Pot Clavulanate]     Heart racing   Codeine Other (See Comments)    Lethargy   Fluticasone Other (See Comments)   Iodine Other (See Comments)    Per allergy test   Latex     REACTION: hives   Levofloxacin Other (See Comments)   Lisinopril Other (See Comments)   Meloxicam Other (See Comments)   Montelukast Other (See Comments)    hallucinations   Octreotide Acetate Dermatitis   Propofol Other (See Comments)   Statins Other (See Comments)    Atorvastatin, rosuvastatin   Sulfonamide Derivatives     Other Reaction(s): GI Intolerance  REACTION: gi upset   Zyrtec [Cetirizine] Other (See Comments)    Night terrors     Review of Systems  Constitutional: Negative.  Negative for appetite change and fatigue.  Respiratory: Negative.    Cardiovascular: Negative.   Endocrine: Negative for cold intolerance and heat intolerance.  Skin: Negative.   Neurological: Negative.  Negative for dizziness, light-headedness and headaches.  Psychiatric/Behavioral:  Negative for behavioral problems and dysphoric mood. The patient is not nervous/anxious.   All other systems reviewed and are negative.    Today's Vitals   11/17/22 1505 11/17/22 1559  BP: (!) 142/98 (!) 158/92   Pulse: 74   Temp: 97.9 F (36.6 C)   TempSrc: Oral   SpO2: 95%   Weight: 187 lb (84.8 kg)   Height: 5\' 2"  (1.575 m)    Body mass index is 34.2 kg/m.   Objective:  Physical Exam Vitals reviewed.  Constitutional:      Appearance: She is well-developed.  HENT:     Head: Normocephalic and atraumatic.  Eyes:     Pupils: Pupils are equal, round, and reactive to light.  Cardiovascular:     Rate and Rhythm: Normal rate and regular rhythm.     Pulses: Normal pulses.     Heart sounds: Normal heart sounds. No murmur heard. Pulmonary:     Effort: Pulmonary effort is normal. No respiratory distress.     Breath sounds: Normal breath sounds. No wheezing.  Musculoskeletal:        General: Normal range of motion.  Skin:    General: Skin is warm and dry.     Capillary Refill: Capillary refill takes less than 2 seconds.  Neurological:     General: No focal deficit present.     Mental Status: She is alert and oriented to person, place, and time.     Cranial Nerves: No cranial nerve deficit.  Psychiatric:        Mood and Affect: Mood normal.         Assessment And Plan:     1. Coronary artery disease of bypass graft of native heart with stable angina pectoris (HCC) Comments: Will refer to Cardiology due to her history - Ambulatory referral to Cardiology  2. Hypertensive heart and renal disease with renal failure, stage 1 through stage 4 or unspecified chronic kidney disease, without heart failure Comments: Blood pressure is elevated this visit, this is her first visit - CMP14 + Anion Gap - Microalbumin / Creatinine Urine Ratio  3. Acquired hypothyroidism Comments: Will check thyroid levels - TSH - T4 - T3, free  4. Mixed hyperlipidemia Comments: She is not on a medication at this time. Will check lipid panel - Lipid panel  5. History of MI (myocardial infarction) - Ambulatory referral to Cardiology  6. Type 2 diabetes mellitus with stage 3a chronic kidney disease,  without long-term current use of insulin (HCC) Comments: Seen she had a HgbA1c is 7.1 in October at an outside facility will recheck HgbA1c today. She will likely need to start medications if still elevated - Hemoglobin A1c - Microalbumin / Creatinine Urine Ratio  7. Seasonal allergies - Ambulatory referral to Allergy  8. Sleep apnea, unspecified type Comments: Will refer to a sleep provider since her previous provider is retiring - Ambulatory referral to Sleep Studies  9. CPAP (continuous positive airway pressure) dependence - Ambulatory referral to Sleep Studies  10. Statin myopathy [G72.0, T46.6X5A] Comments: She has been on Praluent - Lipid panel  11. PAD (peripheral artery disease) (HCC)  12. History of breast cancer Comments: Had breast cancer in 1997 of right breast stage 4 and treatment with chemotherapy   Patient was given opportunity to ask questions. Patient verbalized understanding of the plan and was able to repeat key elements of the plan. All questions were answered to their satisfaction.   Arnette Felts, FNP   I, Arnette Felts, FNP, have reviewed all documentation for this visit. The documentation on 11/17/22 for the exam, diagnosis, procedures, and orders are all accurate and complete.   THE PATIENT IS ENCOURAGED TO PRACTICE SOCIAL DISTANCING DUE TO THE COVID-19 PANDEMIC.

## 2022-11-18 LAB — CMP14 + ANION GAP
ALT: 22 IU/L (ref 0–32)
AST: 24 IU/L (ref 0–40)
Albumin/Globulin Ratio: 1.6 (ref 1.2–2.2)
Albumin: 4.3 g/dL (ref 3.8–4.8)
Alkaline Phosphatase: 86 IU/L (ref 44–121)
Anion Gap: 16 mmol/L (ref 10.0–18.0)
BUN/Creatinine Ratio: 15 (ref 12–28)
BUN: 19 mg/dL (ref 8–27)
Bilirubin Total: 0.4 mg/dL (ref 0.0–1.2)
CO2: 22 mmol/L (ref 20–29)
Calcium: 9.5 mg/dL (ref 8.7–10.3)
Chloride: 102 mmol/L (ref 96–106)
Creatinine, Ser: 1.26 mg/dL — ABNORMAL HIGH (ref 0.57–1.00)
Globulin, Total: 2.7 g/dL (ref 1.5–4.5)
Glucose: 105 mg/dL — ABNORMAL HIGH (ref 70–99)
Potassium: 4.1 mmol/L (ref 3.5–5.2)
Sodium: 140 mmol/L (ref 134–144)
Total Protein: 7 g/dL (ref 6.0–8.5)
eGFR: 46 mL/min/{1.73_m2} — ABNORMAL LOW (ref >59)

## 2022-11-18 LAB — LIPID PANEL
Chol/HDL Ratio: 3.2 ratio (ref 0.0–4.4)
Cholesterol, Total: 149 mg/dL (ref 100–199)
HDL: 47 mg/dL (ref >39)
LDL Chol Calc (NIH): 74 mg/dL (ref 0–99)
Triglycerides: 164 mg/dL — ABNORMAL HIGH (ref 0–149)
VLDL Cholesterol Cal: 28 mg/dL (ref 5–40)

## 2022-11-18 LAB — TSH: TSH: 0.827 u[IU]/mL (ref 0.450–4.500)

## 2022-11-18 LAB — T3, FREE: T3, Free: 2.7 pg/mL (ref 2.0–4.4)

## 2022-11-18 LAB — HEMOGLOBIN A1C
Est. average glucose Bld gHb Est-mCnc: 163 mg/dL
Hgb A1c MFr Bld: 7.3 % — ABNORMAL HIGH (ref 4.8–5.6)

## 2022-11-18 LAB — MICROALBUMIN / CREATININE URINE RATIO
Creatinine, Urine: 72.3 mg/dL
Microalb/Creat Ratio: <4 mg/g creat (ref 0–29)
Microalbumin, Urine: <3 ug/mL

## 2022-11-18 LAB — T4: T4, Total: 7 ug/dL (ref 4.5–12.0)

## 2022-11-22 ENCOUNTER — Telehealth: Payer: Self-pay | Admitting: Internal Medicine

## 2022-11-22 NOTE — Telephone Encounter (Signed)
Patient states she feels her synthroid medication is making her A1C high.  I did inform patient she needs to contact her pcp who manages her thyroid medication and discuss her A1C concern with her pcp as well.

## 2022-11-22 NOTE — Telephone Encounter (Signed)
Pt c/o medication issue:  1. Name of Medication:   levothyroxine (SYNTHROID) 50 MCG tablet    2. How are you currently taking this medication (dosage and times per day)? As prescribed  3. Are you having a reaction (difficulty breathing--STAT)?   4. What is your medication issue? Patient was taken off of this medication and then resumed once she realized something was wrong with how she was feeling. She states now her A1C is up and she is unsure what the cause would be for this and would like to discuss this.

## 2022-11-30 ENCOUNTER — Ambulatory Visit: Payer: Medicare Other

## 2022-11-30 VITALS — BP 138/80 | HR 70 | Temp 98.5°F | Ht 62.0 in | Wt 187.0 lb

## 2022-11-30 DIAGNOSIS — I10 Essential (primary) hypertension: Secondary | ICD-10-CM

## 2022-11-30 NOTE — Patient Instructions (Signed)
Hypertension, Adult ?Hypertension is another name for high blood pressure. High blood pressure forces your heart to work harder to pump blood. This can cause problems over time. ?There are two numbers in a blood pressure reading. There is a top number (systolic) over a bottom number (diastolic). It is best to have a blood pressure that is below 120/80. ?What are the causes? ?The cause of this condition is not known. Some other conditions can lead to high blood pressure. ?What increases the risk? ?Some lifestyle factors can make you more likely to develop high blood pressure: ?Smoking. ?Not getting enough exercise or physical activity. ?Being overweight. ?Having too much fat, sugar, calories, or salt (sodium) in your diet. ?Drinking too much alcohol. ?Other risk factors include: ?Having any of these conditions: ?Heart disease. ?Diabetes. ?High cholesterol. ?Kidney disease. ?Obstructive sleep apnea. ?Having a family history of high blood pressure and high cholesterol. ?Age. The risk increases with age. ?Stress. ?What are the signs or symptoms? ?High blood pressure may not cause symptoms. Very high blood pressure (hypertensive crisis) may cause: ?Headache. ?Fast or uneven heartbeats (palpitations). ?Shortness of breath. ?Nosebleed. ?Vomiting or feeling like you may vomit (nauseous). ?Changes in how you see. ?Very bad chest pain. ?Feeling dizzy. ?Seizures. ?How is this treated? ?This condition is treated by making healthy lifestyle changes, such as: ?Eating healthy foods. ?Exercising more. ?Drinking less alcohol. ?Your doctor may prescribe medicine if lifestyle changes do not help enough and if: ?Your top number is above 130. ?Your bottom number is above 80. ?Your personal target blood pressure may vary. ?Follow these instructions at home: ?Eating and drinking ? ?If told, follow the DASH eating plan. To follow this plan: ?Fill one half of your plate at each meal with fruits and vegetables. ?Fill one fourth of your plate  at each meal with whole grains. Whole grains include whole-wheat pasta, brown rice, and whole-grain bread. ?Eat or drink low-fat dairy products, such as skim milk or low-fat yogurt. ?Fill one fourth of your plate at each meal with low-fat (lean) proteins. Low-fat proteins include fish, chicken without skin, eggs, beans, and tofu. ?Avoid fatty meat, cured and processed meat, or chicken with skin. ?Avoid pre-made or processed food. ?Limit the amount of salt in your diet to less than 1,500 mg each day. ?Do not drink alcohol if: ?Your doctor tells you not to drink. ?You are pregnant, may be pregnant, or are planning to become pregnant. ?If you drink alcohol: ?Limit how much you have to: ?0-1 drink a day for women. ?0-2 drinks a day for men. ?Know how much alcohol is in your drink. In the [place] one drink equals one 12 oz bottle of beer (355 mL), one 5 oz glass of wine (148 mL), or one 1? oz glass of hard liquor (44 mL). ?Lifestyle ? ?Work with your doctor to stay at a healthy weight or to lose weight. Ask your doctor what the best weight is for you. ?Get at least 30 minutes of exercise that causes your heart to beat faster (aerobic exercise) most days of the week. This may include walking, swimming, or biking. ?Get at least 30 minutes of exercise that strengthens your muscles (resistance exercise) at least 3 days a week. This may include lifting weights or doing Pilates. ?Do not smoke or use any products that contain nicotine or tobacco. If you need help quitting, ask your doctor. ?Check your blood pressure at home as told by your doctor. ?Keep all follow-up visits. ?Medicines ?Take over-the-counter and prescription medicines   only as told by your doctor. Follow directions carefully. ?Do not skip doses of blood pressure medicine. The medicine does not work as well if you skip doses. Skipping doses also puts you at risk for problems. ?Ask your doctor about side effects or reactions to medicines that you should watch  for. ?Contact a doctor if: ?You think you are having a reaction to the medicine you are taking. ?You have headaches that keep coming back. ?You feel dizzy. ?You have swelling in your ankles. ?You have trouble with your vision. ?Get help right away if: ?You get a very bad headache. ?You start to feel mixed up (confused). ?You feel weak or numb. ?You feel faint. ?You have very bad pain in your: ?Chest. ?Belly (abdomen). ?You vomit more than once. ?You have trouble breathing. ?These symptoms may be an emergency. Get help right away. Call 911. ?Do not wait to see if the symptoms will go away. ?Do not drive yourself to the hospital. ?Summary ?Hypertension is another name for high blood pressure. ?High blood pressure forces your heart to work harder to pump blood. ?For most people, a normal blood pressure is less than 120/80. ?Making healthy choices can help lower blood pressure. If your blood pressure does not get lower with healthy choices, you may need to take medicine. ?This information is not intended to replace advice given to you by your health care provider. Make sure you discuss any questions you have with your health care provider. ?Document Revised: 05/21/2021 Document Reviewed: 05/21/2021 ?Elsevier Patient Education ? 2023 Elsevier Inc[employer] ? ?

## 2022-11-30 NOTE — Progress Notes (Signed)
Patient presents today for a BP check, patient currently taking amLODipine 5mg  AM, losartan-hctz 50-12.5mg  AM.  BP Readings from Last 3 Encounters:  11/30/22 (!) 150/90  11/17/22 (!) 158/92  09/08/22 136/82  Per provider - Carolyn Fleming needs to take her amlodipine at night and we may need to increase her losartan/hctz to 100/12.5   Patient declined any changes to her medications, patient states Carolyn Fleming is going to follow up with her cardiologist with any changes to her medication. Patient declined any changes or services and left facility, patient also declined f/u appt.

## 2022-12-03 DIAGNOSIS — I129 Hypertensive chronic kidney disease with stage 1 through stage 4 chronic kidney disease, or unspecified chronic kidney disease: Secondary | ICD-10-CM | POA: Diagnosis not present

## 2022-12-03 DIAGNOSIS — N281 Cyst of kidney, acquired: Secondary | ICD-10-CM | POA: Diagnosis not present

## 2022-12-03 DIAGNOSIS — N2581 Secondary hyperparathyroidism of renal origin: Secondary | ICD-10-CM | POA: Diagnosis not present

## 2022-12-03 DIAGNOSIS — E1122 Type 2 diabetes mellitus with diabetic chronic kidney disease: Secondary | ICD-10-CM | POA: Diagnosis not present

## 2022-12-03 DIAGNOSIS — E785 Hyperlipidemia, unspecified: Secondary | ICD-10-CM | POA: Diagnosis not present

## 2022-12-03 DIAGNOSIS — N1831 Chronic kidney disease, stage 3a: Secondary | ICD-10-CM | POA: Diagnosis not present

## 2022-12-06 ENCOUNTER — Ambulatory Visit (INDEPENDENT_AMBULATORY_CARE_PROVIDER_SITE_OTHER): Payer: Medicare Other | Admitting: Cardiovascular Disease

## 2022-12-06 ENCOUNTER — Encounter (HOSPITAL_BASED_OUTPATIENT_CLINIC_OR_DEPARTMENT_OTHER): Payer: Self-pay | Admitting: Cardiovascular Disease

## 2022-12-06 VITALS — BP 122/64 | HR 66 | Ht 62.0 in | Wt 189.3 lb

## 2022-12-06 DIAGNOSIS — I25810 Atherosclerosis of coronary artery bypass graft(s) without angina pectoris: Secondary | ICD-10-CM

## 2022-12-06 DIAGNOSIS — I739 Peripheral vascular disease, unspecified: Secondary | ICD-10-CM | POA: Diagnosis not present

## 2022-12-06 DIAGNOSIS — G4733 Obstructive sleep apnea (adult) (pediatric): Secondary | ICD-10-CM | POA: Diagnosis not present

## 2022-12-06 DIAGNOSIS — E785 Hyperlipidemia, unspecified: Secondary | ICD-10-CM | POA: Diagnosis not present

## 2022-12-06 DIAGNOSIS — I10 Essential (primary) hypertension: Secondary | ICD-10-CM | POA: Diagnosis not present

## 2022-12-06 DIAGNOSIS — E669 Obesity, unspecified: Secondary | ICD-10-CM | POA: Diagnosis not present

## 2022-12-06 MED ORDER — LOSARTAN POTASSIUM-HCTZ 100-12.5 MG PO TABS: 1.0000 | ORAL_TABLET | Freq: Every day | ORAL | 3 refills | Status: DC

## 2022-12-06 MED ORDER — AMLODIPINE BESYLATE 5 MG PO TABS: 5.0000 mg | ORAL_TABLET | Freq: Every day | ORAL | 3 refills | Status: DC

## 2022-12-06 NOTE — Progress Notes (Signed)
Cardiology Office Note   Date:  12/06/2022   ID:  Carolyn Fleming, Carolyn Fleming May 20, 1951, MRN [id number]  PCP:  Arnette Felts, FNP  Cardiologist:   Chilton Si, MD   No chief complaint on file.  History of Present Illness: Carolyn Fleming is a 72 y.o. female with hypertension, hyperlipidemia, CAD (anterior STEMI status post CABG), and OSA on CPAP who presents for follow-up.  She was previously a patient of Dr. Michaelle Copas.  She has a history of emergent CABG (LIMA-->LAD, SVG-->OM1) in 2013.  Last cath in 2016 revealed total occlusion of the distal left main and the mid LAD.  She had 90% proximal stenosis in the proximal LAD.  Her LIMA to the distal LAD and SVG to OM were patent.  Proximal to mid LAD filling was via the native circumflex.  There was concern that this region may be ischemic and causing symptoms.  RCA was widely patent.  Medical management was recommended.  If her symptoms became unacceptable there was consideration for recanalizing the distal left main to provide flow to the proximal and mid LAD.  She had an echo 06/2022 that revealed LVEF 60 to 65% with normal diastolic function.  She also had dyspnea 12/2021 and was doing well.  She has been intolerant of statins but has done well on Praluent.  [name] reports feeling well overall and has been staying busy, exercising at home, and preparing to take a heart-healthy nutrition class at [facility]. She exercises for approximately 30 minutes daily, using a bicycle and elliptical machine. However, she has not noticed any weight loss despite her efforts. The patient attributes her weight gain to her thyroid issues.  [name] denies experiencing any pain or swelling in her legs during exercise. She had surgery in 2014, after which she experienced no swelling or pain. The patient uses a CPAP machine for sleep and reports sleeping for 10-11 hours per night without feeling tired or sluggish during the day. She checks her blood pressure at  home and states that it is consistently normal.  Ms. Loss has no other complaints or concerns at this time.   Past Medical History:  Diagnosis Date   Arthritis    Asthma    Breast cancer    Right breast    GERD (gastroesophageal reflux disease)    Glaucoma    Heart disease    Hyperlipidemia    Hypertension 01/25/2012   Obesity (BMI 35.0-39.9 without comorbidity) 01/25/2012   S/P CABG x 2 01/24/2012   Emergency LIMA to LAD, SVG to OM1, EVH via right thigh   Sleep apnea    uses cpap   Thyroid disease     Past Surgical History:  Procedure Laterality Date   ABDOMINAL HYSTERECTOMY     BREAST LUMPECTOMY  1997   breast cancer   CARDIAC CATHETERIZATION N/A 05/07/2015   Procedure: Left Heart Cath and Cors/Grafts Angiography;  Surgeon: Lyn Records, MD;  Location: [facility] INVASIVE CV LAB;  Service: Cardiovascular;  Laterality: N/A;   CHOLECYSTECTOMY     CORONARY ARTERY BYPASS GRAFT  01/24/2012   Procedure: CORONARY ARTERY BYPASS GRAFTING (CABG);  Surgeon: Purcell Nails, MD;  Location: [facility] OR;  Service: Open Heart Surgery;  Laterality: N/A;  Coronary Artery bypass Graft times two utilizing the left internal mammary artery and the right greater saphenous vein harvested endoscopically,  Transesophageal Echocardiogram   LEFT HEART CATHETERIZATION WITH CORONARY ANGIOGRAM N/A 01/24/2012   Procedure: LEFT HEART CATHETERIZATION WITH CORONARY ANGIOGRAM;  Surgeon: Lesleigh Noe, MD;  Location: [facility] CATH LAB;  Service: Cardiovascular;  Laterality: N/A;   TONSILLECTOMY     TUBAL LIGATION       Current Outpatient Medications  Medication Sig Dispense Refill   Alirocumab (PRALUENT) 150 MG/ML SOAJ Inject 1 Dose into the skin every 14 (fourteen) days. 6 mL 3   aspirin 81 MG chewable tablet Chew 81 mg by mouth daily.     Azelastine HCl 0.15 % SOLN azelastine 0.15 % (205.5 mcg) nasal spray  USE 1 SPRAY INTO EACH NOSTRIL TWICE A DAY 30 mL 5   diazepam (VALIUM) 5 MG tablet Take 1 tablet (5 mg total) by  mouth every 8 (eight) hours as needed (dizziness). 30 tablet 0   Dorzolamide HCl-Timolol Mal PF 2-0.5 % SOLN Place 2 % into the left eye in the morning and at bedtime.      hydrocortisone cream 1 % Apply 1 application topically as needed for itching.     ketoconazole (NIZORAL) 2 % cream Apply 1 application topically as needed for irritation.     levothyroxine (SYNTHROID) 50 MCG tablet Take 50 mcg by mouth daily.     meclizine (ANTIVERT) 12.5 MG tablet Take 1 tablet (12.5 mg total) by mouth 3 (three) times daily as needed for dizziness. 90 tablet 0   nitroGLYCERIN (NITROSTAT) 0.4 MG SL tablet 0.4 mg every 5 (five) minutes as needed for chest pain.     ondansetron (ZOFRAN) 8 MG tablet TAKE 1 TABLET BY MOUTH TWICE A DAY AS NEEDED FOR NAUSEA  1   promethazine (PHENERGAN) 25 MG tablet Take 25 mg by mouth every 4 (four) hours as needed.     Travoprost, BAK Free, (TRAVATAN) 0.004 % SOLN ophthalmic solution Place 1 drop into the left eye.      amLODipine (NORVASC) 5 MG tablet Take 1 tablet (5 mg total) by mouth daily. 90 tablet 3   losartan-hydrochlorothiazide (HYZAAR) 100-12.5 MG tablet Take 1 tablet by mouth daily. 90 tablet 3   No current facility-administered medications for this visit.    Allergies:   Pilocarpine, Acetazolamide, Allegra [fexofenadine], Amoxicillin er, Augmentin [amoxicillin-pot clavulanate], Codeine, Fluticasone, Iodine, Latex, Levofloxacin, Lisinopril, Meloxicam, Montelukast, Octreotide acetate, Propofol, Statins, Sulfonamide derivatives, and Zyrtec [cetirizine]    Social History:  The patient  reports that she has never smoked. She has never used smokeless tobacco. She reports that she does not drink alcohol and does not use drugs.   Family History:  The patient's family history includes Breast cancer in her niece; Cancer in her brother and mother; Diabetes in her brother, mother, and sister; Hypertension in her brother and mother.    ROS:  Please see the history of present  illness.   Otherwise, review of systems are positive for none.   All other systems are reviewed and negative.    PHYSICAL EXAM: VS:  BP 122/64   Pulse 66   Ht  (1.575 m)   Wt 189 lb 4.8 oz (85.9 kg)   BMI 34.62 kg/m  , BMI Body mass index is 34.62 kg/m. GENERAL:  Well appearing HEENT:  Pupils equal round and reactive, fundi not visualized, oral mucosa unremarkable NECK:  No jugular venous distention, waveform within normal limits, carotid upstroke brisk and symmetric, no bruits, no thyromegaly LUNGS:  Clear to auscultation bilaterally HEART:  RRR.  PMI not displaced or sustained,S1 and S2 within normal limits, no S3, no S4, no clicks, no rubs, no murmurs ABD:  Flat, positive bowel  sounds normal in frequency in pitch, no bruits, no rebound, no guarding, no midline pulsatile mass, no hepatomegaly, no splenomegaly EXT:  2 plus pulses throughout, no edema, no cyanosis no clubbing SKIN:  No rashes no nodules NEURO:  Cranial nerves II through XII grossly intact, motor grossly intact throughout PSYCH:  Cognitively intact, oriented to person place and time   EKG:  EKG is not ordered today.  LHC 05/07/15: Total occlusion of the distal left main. The native LAD is totally occluded in the mid vessel. The proximal LAD contains 90% stenosis. Perfusion to the proximal to mid LAD is via the native circumflex. The proximal to mid LAD territory may be the ischemic territory producing symptoms. The native right coronary is widely patent. Patent saphenous vein graft to the obtuse marginal. Patent LIMA to the distal LAD. Normal left ventricular systolic function     RECOMMENDATIONS:   Continue medical therapy. If unacceptable symptoms, consider attempts at recanalization of distal left main to provide flow into the proximal to mid LAD. Continue long-acting nitrates and beta blocker therapy. Continue Plavix.  Echo 06/29/22: IMPRESSIONS    1. Left ventricular ejection fraction, by  estimation, is 60 to 65%. The left ventricle has normal function. The left ventricle has no regional wall motion abnormalities. Left ventricular diastolic parameters were normal.  2. Right ventricular systolic function is normal. The right ventricular size is normal. There is normal pulmonary artery systolic pressure.  3. No evidence of mitral valve regurgitation.  4. The aortic valve is tricuspid. Aortic valve regurgitation is not visualized.  5. The inferior vena cava is normal in size with greater than 50% respiratory variability, suggesting right atrial pressure of 3 mmHg.    Recent Labs: 11/17/2022: ALT 22; BUN 19; Creatinine, Ser 1.26; Potassium 4.1; Sodium 140; TSH 0.827    Lipid Panel    Component Value Date/Time   CHOL 149 11/17/2022 1556   TRIG 164 (H) 11/17/2022 1556   HDL 47 11/17/2022 1556   CHOLHDL 3.2 11/17/2022 1556   CHOLHDL 4.4 12/12/2015 0828   VLDL 29 12/12/2015 0828   LDLCALC 74 11/17/2022 1556   LDLDIRECT 149.1 05/28/2013 0946      Wt Readings from Last 3 Encounters:  12/06/22 189 lb 4.8 oz (85.9 kg)  11/30/22 187 lb (84.8 kg)  11/17/22 187 lb (84.8 kg)      ASSESSMENT AND PLAN:   # Coronary artery disease: # Hyperlipidemia: S/p CABG. she is exercising regularly and has no symptoms.  Continue aspirin and Praluent.  # Hypertension: - Patient reports normal blood pressure readings at home. Encourage the patient to continue monitoring blood pressure at home and bring the machine to the office for calibration during the next visit.  At times she has whitecoat hypertension but it is well-controlled in the office today.  Continue current medications: losartan, hydrochlorothiazide, and amlodipine.  # Obesity:  - Patient is exercising regularly and taking a heart-healthy nutrition class. Encourage the patient to continue these efforts and focus on reducing carbohydrate intake.  No changes to the current plan.  # Obstructive Sleep apnea: - Patient is  using a CPAP machine regularly and reports adequate sleep duration and quality. Encourage the patient to continue using the CPAP machine as prescribed.   Primary care physician: - Patient inquired about a primary care physician. Recommended Dr. Abbe Amsterdam as a potential option.  Current medicines are reviewed at length with the patient today.  The patient does not have concerns regarding medicines.  The  following changes have been made:  no change  Labs/ tests ordered today include:  No orders of the defined types were placed in this encounter.    Disposition:   FU with Sibel Khurana C. Duke Salvia, MD, [facility] in 1 year.      Signed, Hoorain Kozakiewicz C. Duke Salvia, MD, [facility]  12/06/2022 8:22 AM    [facility]

## 2022-12-06 NOTE — Patient Instructions (Signed)
Medication Instructions:  Your physician recommends that you continue on your current medications as directed. Please refer to the Current Medication list given to you today.   *If you need a refill on your cardiac medications before your next appointment, please call your pharmacy*  Lab Work: NONE  Testing/Procedures: NONE  Follow-Up: At [facility], you and your health needs are our priority.  As part of our continuing mission to provide you with exceptional heart care, we have created designated Provider Care Teams.  These Care Teams include your primary Cardiologist (physician) and Advanced Practice Providers (APPs -  Physician Assistants and Nurse Practitioners) who all work together to provide you with the care you need, when you need it.  We recommend signing up for the patient portal called "MyChart".  Sign up information is provided on this After Visit Summary.  MyChart is used to connect with patients for Virtual Visits (Telemedicine).  Patients are able to view lab/test results, encounter notes, upcoming appointments, etc.  Non-urgent messages can be sent to your provider as well.   To learn more about what you can do with MyChart, go to [email or web address].    Your next appointment:   12 month(s)  Provider:   Chilton Si, MD or Gillian Shields, NP

## 2022-12-07 ENCOUNTER — Telehealth: Payer: Self-pay | Admitting: Nurse Practitioner

## 2022-12-07 ENCOUNTER — Ambulatory Visit: Payer: Medicare Other | Admitting: General Practice

## 2022-12-07 NOTE — Telephone Encounter (Signed)
Called patient to schedule Medicare Annual Wellness Visit (AWV). Left message for patient to call back and schedule Medicare Annual Wellness Visit (AWV).  Last date of AWV: awvi 09/16/18 per [facility]   Please schedule an appointment at any time with [facility].  If any questions, please contact me at [phone].  Thank you ,  Rudell Cobb AWV direct phone # [phone]

## 2022-12-08 ENCOUNTER — Ambulatory Visit: Payer: Medicare Other | Admitting: Internal Medicine

## 2022-12-20 ENCOUNTER — Encounter: Payer: Self-pay | Admitting: Neurology

## 2022-12-20 ENCOUNTER — Ambulatory Visit (INDEPENDENT_AMBULATORY_CARE_PROVIDER_SITE_OTHER): Payer: Medicare Other | Admitting: Neurology

## 2022-12-20 VITALS — BP 160/88 | HR 71 | Ht 62.0 in | Wt 187.0 lb

## 2022-12-20 DIAGNOSIS — Z951 Presence of aortocoronary bypass graft: Secondary | ICD-10-CM | POA: Diagnosis not present

## 2022-12-20 DIAGNOSIS — I5042 Chronic combined systolic (congestive) and diastolic (congestive) heart failure: Secondary | ICD-10-CM

## 2022-12-20 DIAGNOSIS — L308 Other specified dermatitis: Secondary | ICD-10-CM | POA: Diagnosis not present

## 2022-12-20 DIAGNOSIS — E039 Hypothyroidism, unspecified: Secondary | ICD-10-CM | POA: Diagnosis not present

## 2022-12-20 DIAGNOSIS — G4733 Obstructive sleep apnea (adult) (pediatric): Secondary | ICD-10-CM | POA: Diagnosis not present

## 2022-12-20 MED ORDER — FEXOFENADINE HCL 60 MG PO TABS: 60.0000 mg | ORAL_TABLET | Freq: Two times a day (BID) | ORAL | 5 refills | Status: DC

## 2022-12-20 NOTE — Progress Notes (Signed)
SLEEP MEDICINE CLINIC    Provider:  Melvyn Novas, MD  Primary Care Physician:  Arnette Felts, FNP [address] [place] [address]     Referring Provider: Arnette Felts, Fnp [address] [place],  [place] [address]          Chief Complaint according to patient   Patient presents with:     New Patient (Initial Visit)           HISTORY OF PRESENT ILLNESS:  Carolyn Fleming is a 72 y.o. female patient who is seen upon referral on 12/20/2022 from Arnette Felts, NP ,  for a TOC OSA on CPAP. Marland Kitchen  Chief concern according to patient :  " I was living in [place] but my son visited me and noted and remarked upon my breathing at night snoring and pausing in my breath.  This led me to be referred to Dr. Allie Dimmer for sleep study.  At the time I also worked as a Scientist, physiological at [employer].  The sleep study was positive for apnea and I started CPAP treatment.    I have the pleasure of seeing Carolyn Fleming 12/20/22 a right-handed AA female with OSA on CPAP , PAD, HTN, hyperlipidemia, Obesity , open angle Glaucoma, Knee arthritis.  uncontrolled DM,  The patient had the first sleep study in the year 2012  with a result of an AHI ( Apnea Hypopnea index)  of OSA - [facility] are not available. ,  Sleep relevant ROS medical history: Nocturia  1-2 times,  had a Tonsillectomy in High School, age 72, hypothyroidism - controlled under medication. A d/c of her thyroid medication in April 2023 let to a crisis.  Family medical /sleep history: sister has thyroid disease. no other family member on CPAP with OSA.    Social history: Patient is retired from [employer] and lives in a household  alone. Adult child a son , one grandson. No pets.   Tobacco use: none .  ETOH use none,  Caffeine intake in form of Coffee( /) Soda( /) Tea ( green tea- one glass a week) or energy drinks Exercise in form of bike and elliptical trainer. .   Hobbies :reading, travel. 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 The patient goes to bed at 10 PM and continues to sleep for 10-12 hours, wakes for 0-2 bathroom breaks. The longer sleep time was associated with hypothyroidisms.  The preferred sleep position is laterally, with the support of 1-2 pillows. Flat bed bedroom is cool and quiet and dark.  Dreams are reportedly rare.  The patient wakes up spontaneously/ 7 and takes meds at 9 AM - is the usual rise time since her thyroid function has been controlled. . She reports not feeling refreshed or restored in AM if she doesn't use her CPAP - , with symptoms such as dry mouth, morning headaches, and residual fatigue.  Naps are taken rarely.    CPAP compliance : 12-20-2022; This patient is also over was also followed by Dr. Verdis Prime cardiology who has now retired.  His sleep doctor was Theressa Millard who has now retired the CPAP download goes back to the year 2020 as far as I know this may have been the year the machine was issued and it would have been November 2020 so she would be due for a new machine in  November 2025.  There has been no difficulty with using the CPAP there have been some issues with the DME that originally supplied her at the time it was [facility] home [facility] she is now was at the care.  She is 100% compliant CPAP user by days and hours with an average use of time of 9 hours 50 minutes.  She is using an AutoSet between 4 and 12 cm water without expiratory pressure relief setting.  Her AHI is 0.4/h which is excellent given that she remembers being told that her apnea was severe.  She does have a 95th percentile pressure at 11.2 cm water and air leakage at the 95th percentile is 19.3 L a minute this is moderate.    Altogether I would not need to change her settings.    Review of Systems: Out of a complete 14 system review, the patient complains of only the following symptoms, and all other  reviewed systems are negative.:  Fatigue, sleepiness , snoring, all controlled on CPAP   How likely are you to doze in the following situations: 0 = not likely, 1 = slight chance, 2 = moderate chance, 3 = high chance   Sitting and Reading? Watching Television? Sitting inactive in a public place (theater or meeting)? As a passenger in a car for an hour without a break? Lying down in the afternoon when circumstances permit? Sitting and talking to someone? Sitting quietly after lunch without alcohol? In a car, while stopped for a few minutes in traffic?   Total = 0/ 24 points p on CPAP  FSS endorsed at 9/ 63 points.   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    Social History Narrative   Grew up in [place], moved to 1997 to [place],  [place]. Breast Cancer dx in [place].   Moved to [place] in 1999, worked at [employer].      Social Determinants of Health   Financial Resource Strain: Not on file  Food Insecurity: Not on file  Transportation Needs: Not on file  Physical Activity: Not on file  Stress: Not on file  Social Connections: Not on file    Family History  Problem Relation Age of Onset   Cancer Mother        Colon   Diabetes Mother    Hypertension Mother    Diabetes Sister    Cancer Brother        Lung   Diabetes Brother    Hypertension Brother    Breast cancer Niece        30s    Past Medical History:  Diagnosis Date   Arthritis    Asthma    Breast  cancer (HCC)    Right breast , lumpectomy 1997   GERD (gastroesophageal reflux disease)    Glaucoma    Heart disease, CAD , double  bypass surgery.  2014   Hyperlipidemia    Hypertension 01/25/2012   Obesity (BMI 35.0-39.9 without comorbidity) 01/25/2012   S/P CABG x 2 01/24/2012   Emergency LIMA to LAD, SVG to OM1, EVH via right thigh   Sleep apnea, OSA dx over 10 years ago, Dr Earl Gala.    uses cpap   Thyroid disease, hypothyroidism. Thyroid crisis in 2023- induced by d/c of medication without weaning.      Past Surgical History:  Procedure Laterality Date   ABDOMINAL HYSTERECTOMY     BREAST LUMPECTOMY  1997   breast cancer   CARDIAC CATHETERIZATION N/A 05/07/2015   Procedure: Left Heart Cath and Cors/Grafts Angiography;  Surgeon: Lyn Records, MD;  Location: [facility] INVASIVE CV LAB;  Service: Cardiovascular;  Laterality: N/A;   CHOLECYSTECTOMY     CORONARY ARTERY BYPASS GRAFT  01/24/2012   Procedure: CORONARY ARTERY BYPASS GRAFTING (CABG);  Surgeon: Purcell Nails, MD;  Location: [facility] OR;  Service: Open Heart Surgery;  Laterality: N/A;  Coronary Artery bypass Graft times two utilizing the left internal mammary artery and the right greater saphenous vein harvested endoscopically,  Transesophageal Echocardiogram   LEFT HEART CATHETERIZATION WITH CORONARY ANGIOGRAM N/A 01/24/2012   Procedure: LEFT HEART CATHETERIZATION WITH CORONARY ANGIOGRAM;  Surgeon: Lesleigh Noe, MD;  Location: [facility] CATH LAB;  Service: Cardiovascular;  Laterality: N/A;   TONSILLECTOMY     TUBAL LIGATION       Current Outpatient Medications on File Prior to Visit  Medication Sig Dispense Refill   Alirocumab (PRALUENT) 150 MG/ML SOAJ Inject 1 Dose into the skin every 14 (fourteen) days. 6 mL 3   amLODipine (NORVASC) 5 MG tablet Take 1 tablet (5 mg total) by mouth daily. 90 tablet 3   aspirin 81 MG chewable tablet Chew 81 mg by mouth daily.     Azelastine HCl 0.15 % SOLN azelastine 0.15 % (205.5 mcg) nasal spray  USE 1  SPRAY INTO EACH NOSTRIL TWICE A DAY 30 mL 5   diazepam (VALIUM) 5 MG tablet Take 1 tablet (5 mg total) by mouth every 8 (eight) hours as needed (dizziness). 30 tablet 0   Dorzolamide HCl-Timolol Mal PF 2-0.5 % SOLN Place 2 % into the left eye in the morning and at bedtime.      hydrocortisone cream 1 % Apply 1 application topically as needed for itching.     ketoconazole (NIZORAL) 2 % cream Apply 1 application topically as needed for irritation.     levothyroxine (SYNTHROID) 50 MCG tablet Take 50 mcg by mouth daily.     losartan-hydrochlorothiazide (HYZAAR) 100-12.5 MG tablet Take 1 tablet by mouth daily. 90 tablet 3   meclizine (ANTIVERT) 12.5 MG tablet Take 1 tablet (12.5 mg total) by mouth 3 (three) times daily as needed for dizziness. 90 tablet 0   nitroGLYCERIN (NITROSTAT) 0.4 MG SL tablet 0.4 mg every 5 (five) minutes as needed for chest pain.     ondansetron (ZOFRAN) 8 MG tablet TAKE 1 TABLET BY MOUTH TWICE A DAY AS NEEDED FOR NAUSEA  1   promethazine (PHENERGAN) 25 MG tablet Take 25 mg by mouth every 4 (four) hours as needed.     Travoprost, BAK Free, (TRAVATAN) 0.004 % SOLN ophthalmic solution Place 1 drop into the left eye.      No current facility-administered medications on file prior to visit.    Allergies  Allergen Reactions   Pilocarpine     Other Reaction(s): Other (See Comments)  Reaction: Chills (  intolerance)   Acetazolamide Other (See Comments)   Allegra [Fexofenadine] Other (See Comments)    hallucinations   Amoxicillin Er Other (See Comments)   Augmentin [Amoxicillin-Pot Clavulanate]     Heart racing   Codeine Other (See Comments)    Lethargy   Fluticasone Other (See Comments)   Iodine Other (See Comments)    Per allergy test   Latex     REACTION: hives   Levofloxacin Other (See Comments)   Lisinopril Other (See Comments)   Meloxicam Other (See Comments)   Montelukast Other (See Comments)    hallucinations   Octreotide Acetate Dermatitis   Propofol Other  (See Comments)   Statins Other (See Comments)    Atorvastatin, rosuvastatin   Sulfonamide Derivatives     Other Reaction(s): GI Intolerance  REACTION: gi upset   Zyrtec [Cetirizine] Other (See Comments)    Night terrors     DIAGNOSTIC DATA (LABS, IMAGING, TESTING) - I reviewed patient records, labs, notes, testing and imaging myself where available.  Lab Results  Component Value Date   WBC 4.4 05/02/2015   HGB 14.1 05/02/2015   HCT 42.7 05/02/2015   MCV 85.5 05/02/2015   PLT 219.0 05/02/2015      Component Value Date/Time   NA 140 11/17/2022 1556   K 4.1 11/17/2022 1556   CL 102 11/17/2022 1556   CO2 22 11/17/2022 1556   GLUCOSE 105 (H) 11/17/2022 1556   GLUCOSE 98 05/02/2015 0844   BUN 19 11/17/2022 1556   CREATININE 1.26 (H) 11/17/2022 1556   CALCIUM 9.5 11/17/2022 1556   PROT 7.0 11/17/2022 1556   ALBUMIN 4.3 11/17/2022 1556   AST 24 11/17/2022 1556   ALT 22 11/17/2022 1556   ALKPHOS 86 11/17/2022 1556   BILITOT 0.4 11/17/2022 1556   GFRNONAA 44 (L) 02/22/2018 0910   GFRAA 50 (L) 02/22/2018 0910   Lab Results  Component Value Date   CHOL 149 11/17/2022   HDL 47 11/17/2022   LDLCALC 74 11/17/2022   LDLDIRECT 149.1 05/28/2013   TRIG 164 (H) 11/17/2022   CHOLHDL 3.2 11/17/2022   Lab Results  Component Value Date   HGBA1C 7.3 (H) 11/17/2022   No results found for: "VITAMINB12" Lab Results  Component Value Date   TSH 0.827 11/17/2022    PHYSICAL EXAM:  Today's Vitals   12/20/22 0922  BP: (!) 160/88  Pulse: 71  Weight: 187 lb (84.8 kg)  Height: 5\' 2"  (1.575 m)   Body mass index is 34.2 kg/m.   Wt Readings from Last 3 Encounters:  12/20/22 187 lb (84.8 kg)  12/06/22 189 lb 4.8 oz (85.9 kg)  11/30/22 187 lb (84.8 kg)     Ht Readings from Last 3 Encounters:  12/20/22 5\' 2"  (1.575 m)  12/06/22 5\' 2"  (1.575 m)  11/30/22 5\' 2"  (1.575 m)      General: The patient is awake, alert and appears not in acute distress. The patient is well  groomed. Head: Normocephalic, atraumatic. Neck is supple. Mallampati 2,  small, lateral crowding.  neck circumference:15.5 inches . Nasal airflow patent.  Right nasion is smaller. Retrognathia is  seen.  Dental status: biological  Cardiovascular:  Regular rate and cardiac rhythm by pulse,  without distended neck veins. Respiratory: Lungs are clear to auscultation.  Skin:  Without evidence of ankle edema, or rash. Trunk: The patient's posture is erect.   NEUROLOGIC EXAM: The patient is awake and alert, oriented to place and time.   Memory subjective described as  intact.  Attention span & concentration ability appears normal.  Speech is fluent,  without  dysarthria, dysphonia or aphasia.  Mood and affect are appropriate.   Cranial nerves: no loss of smell or taste reported  Right eye trauma, tearing, ptosis,  glaucoma.    Funduscopic exam deferred.  Extraocular movements in vertical and horizontal planes were intact and without nystagmus. No Diplopia. Visual fields by finger perimetry are intact. Hearing was intact to soft voice and finger rubbing.  Facial sensation intact to fine touch.  Facial motor strength is symmetric and tongue and uvula move midline.  Neck ROM : rotation, tilt and flexion extension were normal for age and shoulder shrug was symmetrical.    Motor exam:  Symmetric bulk, tone and ROM.   Normal tone without cog-wheeling, symmetric grip strength .   Sensory:  Fine touch and vibration were  normal., includin at both ankles.   Proprioception tested in the upper extremities was normal.   Coordination: Rapid alternating movements in the fingers/hands were of normal speed.  The Finger-to-nose maneuver was intact without evidence of ataxia, dysmetria or tremor.   Gait and station: Patient could rise unassisted from a seated position, walked without assistive device.  Stance is of normal width/ base , slightly stooped. .  Toe and heel walk were deferred.  Deep tendon  reflexes: in the  upper and lower extremities are symmetric and intact.  Babinski response was deferred    ASSESSMENT AND PLAN 73 y.o. year old AA female patient  here for TOC of OSA care .    1)  History of OSA ( severe ?) on CPAP, no known baseline, no sleep test in over 5 years.   Comorbid conditions include allergic rhinitis, sinusitis, retrognathia. Uses nasal pillows on autotitration device, issued 06-2019.  2) fatigue was severe last year after abrupt d/c on thyroid medication - she needed 8 months to regain energy, her prolonged sleep time was reduced to 9 hours form 12 hours. She always was a " good sleeper" and even slept 12 hours. She no longer dream.   3) uncontrolled DM at this time, not manifesting as neuropathy. No nocturia.   4) Reports nightmares/ night terrors on antihistamine medication such as allegra , zyrtec, singulair and claritin.  She has allergic rhinitis, sinusitis.    I will invite this patient for an in lab SPLIT study in 2025  to get a baseline and  titration data. Her machine is currently still working, the current CPAP machine was issued by Deboraha Sprang via [employer], 06-2019, now advocare  .  I plan to follow up either personally or through our NP within 12 months.   I would like to thank Arnette Felts, Fnp [address] [place],  [place] [address] for allowing me to meet with and to take care of this pleasant patient.     After spending a total time of  45  minutes face to face and additional time for physical and neurologic examination, review of laboratory studies,  personal review of imaging studies, reports and results of other testing and review of referral information / records as far as provided in visit,   Electronically signed by: Melvyn Novas, MD 12/20/2022 9:33 AM  [facility] Board certified by The [employer] of Sleep Medicine and Diplomate of the [employer] of Sleep Medicine. Board certified  In Neurology through the ABPN, Fellow of the [employer] of Neurology. Medical Director of [facility].

## 2022-12-20 NOTE — Patient Instructions (Signed)
ASSESSMENT AND PLAN 72 y.o. year old AA female patient  here for TOC of OSA care .    1)  History of OSA ( severe ?) on CPAP, no known baseline, no sleep test in over 5 years.   Comorbid conditions include allergic rhinitis, sinusitis, retrognathia. Uses nasal pillows on autotitration device, issued 06-2019.  2) fatigue was severe last year after abrupt d/c on thyroid medication - she needed 8 months to regain energy, her prolonged sleep time was reduced to 9 hours form 12 hours. She always was a " good sleeper" and even slept 12 hours. She no longer dream.   3) uncontrolled DM at this time, not manifesting as neuropathy. No nocturia.   4) Reports nightmares/ night terrors on antihistamine medication such as allegra , zyrtec, singulair and claritin.  She has allergic rhinitis, sinusitis.    I will invite this patient for an in lab SPLIT study in late summer of 2025 to get a baseline and  titration data. Her machine is currently still working, the current CPAP machine was issued by [employer] via [employer], now advocare.   I plan to follow up either personally or through our NP within 3-5 months.

## 2022-12-22 ENCOUNTER — Institutional Professional Consult (permissible substitution): Payer: Medicare Other | Admitting: Neurology

## 2022-12-27 ENCOUNTER — Telehealth: Payer: Self-pay | Admitting: Nurse Practitioner

## 2022-12-27 DIAGNOSIS — H401113 Primary open-angle glaucoma, right eye, severe stage: Secondary | ICD-10-CM | POA: Diagnosis not present

## 2022-12-27 DIAGNOSIS — H401122 Primary open-angle glaucoma, left eye, moderate stage: Secondary | ICD-10-CM | POA: Diagnosis not present

## 2022-12-27 NOTE — Telephone Encounter (Signed)
Called patient to schedule Medicare Annual Wellness Visit (AWV). Left message for patient to call back and schedule Medicare Annual Wellness Visit (AWV).  Last date of AWV: awvi 09/16/18 per [facility]   Please schedule an appointment at any time with [facility].  If any questions, please contact me at [phone].  Thank you ,  Philippa Vessey [facility] AWV direct phone # [phone] 

## 2022-12-31 ENCOUNTER — Encounter: Payer: Self-pay | Admitting: Nurse Practitioner

## 2023-01-05 ENCOUNTER — Telehealth: Payer: Self-pay | Admitting: Nurse Practitioner

## 2023-01-05 NOTE — Telephone Encounter (Signed)
Patient called me   She was upset regarding her last appointment she had at the office and wanted to talk to office manager about it.  Patient would like a call back

## 2023-01-11 DIAGNOSIS — E785 Hyperlipidemia, unspecified: Secondary | ICD-10-CM | POA: Diagnosis not present

## 2023-01-11 DIAGNOSIS — H409 Unspecified glaucoma: Secondary | ICD-10-CM | POA: Diagnosis not present

## 2023-01-11 DIAGNOSIS — I252 Old myocardial infarction: Secondary | ICD-10-CM | POA: Diagnosis not present

## 2023-01-11 DIAGNOSIS — Z853 Personal history of malignant neoplasm of breast: Secondary | ICD-10-CM | POA: Diagnosis not present

## 2023-01-11 DIAGNOSIS — Z7689 Persons encountering health services in other specified circumstances: Secondary | ICD-10-CM | POA: Diagnosis not present

## 2023-01-11 DIAGNOSIS — M1710 Unilateral primary osteoarthritis, unspecified knee: Secondary | ICD-10-CM | POA: Diagnosis not present

## 2023-01-24 ENCOUNTER — Ambulatory Visit: Payer: Medicare Other | Admitting: Internal Medicine

## 2023-01-27 DIAGNOSIS — C50919 Malignant neoplasm of unspecified site of unspecified female breast: Secondary | ICD-10-CM | POA: Diagnosis not present

## 2023-01-27 DIAGNOSIS — M170 Bilateral primary osteoarthritis of knee: Secondary | ICD-10-CM | POA: Diagnosis not present

## 2023-01-27 DIAGNOSIS — J329 Chronic sinusitis, unspecified: Secondary | ICD-10-CM | POA: Diagnosis not present

## 2023-01-27 DIAGNOSIS — I2510 Atherosclerotic heart disease of native coronary artery without angina pectoris: Secondary | ICD-10-CM | POA: Diagnosis not present

## 2023-01-27 DIAGNOSIS — Z23 Encounter for immunization: Secondary | ICD-10-CM | POA: Diagnosis not present

## 2023-01-27 DIAGNOSIS — B9689 Other specified bacterial agents as the cause of diseases classified elsewhere: Secondary | ICD-10-CM | POA: Diagnosis not present

## 2023-01-27 DIAGNOSIS — J069 Acute upper respiratory infection, unspecified: Secondary | ICD-10-CM | POA: Diagnosis not present

## 2023-01-27 DIAGNOSIS — E785 Hyperlipidemia, unspecified: Secondary | ICD-10-CM | POA: Diagnosis not present

## 2023-01-27 DIAGNOSIS — H6121 Impacted cerumen, right ear: Secondary | ICD-10-CM | POA: Diagnosis not present

## 2023-01-27 DIAGNOSIS — Z1159 Encounter for screening for other viral diseases: Secondary | ICD-10-CM | POA: Diagnosis not present

## 2023-01-27 DIAGNOSIS — H409 Unspecified glaucoma: Secondary | ICD-10-CM | POA: Diagnosis not present

## 2023-01-27 DIAGNOSIS — E039 Hypothyroidism, unspecified: Secondary | ICD-10-CM | POA: Diagnosis not present

## 2023-01-28 DIAGNOSIS — I10 Essential (primary) hypertension: Secondary | ICD-10-CM | POA: Diagnosis not present

## 2023-01-28 DIAGNOSIS — Z1339 Encounter for screening examination for other mental health and behavioral disorders: Secondary | ICD-10-CM | POA: Diagnosis not present

## 2023-01-28 DIAGNOSIS — G720 Drug-induced myopathy: Secondary | ICD-10-CM | POA: Diagnosis not present

## 2023-01-28 DIAGNOSIS — E782 Mixed hyperlipidemia: Secondary | ICD-10-CM | POA: Diagnosis not present

## 2023-01-28 DIAGNOSIS — Z789 Other specified health status: Secondary | ICD-10-CM | POA: Diagnosis not present

## 2023-01-28 DIAGNOSIS — Z0000 Encounter for general adult medical examination without abnormal findings: Secondary | ICD-10-CM | POA: Diagnosis not present

## 2023-01-28 DIAGNOSIS — Z1331 Encounter for screening for depression: Secondary | ICD-10-CM | POA: Diagnosis not present

## 2023-02-02 ENCOUNTER — Telehealth: Payer: Self-pay | Admitting: Cardiovascular Disease

## 2023-02-02 MED ORDER — LOSARTAN POTASSIUM-HCTZ 50-12.5 MG PO TABS: 1.0000 | ORAL_TABLET | Freq: Every day | ORAL | 3 refills | Status: DC

## 2023-02-02 NOTE — Telephone Encounter (Signed)
Patient called back and said that Arnette Felts, FNP, took it upon herself to increase medication from Losartan-HCT 50-12.5 mg to Losartan-HCT 100-12.5 mg without permission. Prior to that, she told her that only her cardiologist had permission to change her medication. Said that at office, blood pressure was high due to her rudeness and unprofessionalism and was irritated. Wanted to let Dr. Duke Salvia know what was going on

## 2023-02-02 NOTE — Telephone Encounter (Signed)
Rx corrected and will forward to Dr Duke Salvia so she will be aware

## 2023-02-02 NOTE — Telephone Encounter (Signed)
Spoke with patient regarding her Losartan-HCT Rx  Per patient she has always taken the Losartan HCT 50-12.5 mg  Explained to patient that the Losartan-HCT 100-12.5 mg rex was on medication list with Dr Duke Salvia and that was why that dose was sent in  Per patient Noberto Retort NP wanted to increase her medication, see 4/16 clinical support note Appears that the patient declined the increase however the dose had been changed in patients chart

## 2023-02-02 NOTE — Telephone Encounter (Signed)
Pt c/o medication issue:  1. Name of Medication: losartan-hydrochlorothiazide (HYZAAR) 100-12.5 MG tablet   2. How are you currently taking this medication (dosage and times per day)? Take 1 tablet by mouth daily.   3. Are you having a reaction (difficulty breathing--STAT)? No   4. What is your medication issue? Patient says that she should be taking 50-12.5 MG tablet instead of 100-12.5 MG

## 2023-02-08 DIAGNOSIS — B9689 Other specified bacterial agents as the cause of diseases classified elsewhere: Secondary | ICD-10-CM | POA: Diagnosis not present

## 2023-02-08 DIAGNOSIS — J069 Acute upper respiratory infection, unspecified: Secondary | ICD-10-CM | POA: Diagnosis not present

## 2023-02-08 DIAGNOSIS — R42 Dizziness and giddiness: Secondary | ICD-10-CM | POA: Diagnosis not present

## 2023-02-08 DIAGNOSIS — J329 Chronic sinusitis, unspecified: Secondary | ICD-10-CM | POA: Diagnosis not present

## 2023-02-23 ENCOUNTER — Telehealth: Payer: Self-pay | Admitting: Cardiovascular Disease

## 2023-02-23 DIAGNOSIS — R7309 Other abnormal glucose: Secondary | ICD-10-CM

## 2023-02-23 NOTE — Telephone Encounter (Signed)
Spoke with patient who was requesting HgbA1c be done Per patient she had not been able to find a PCP that she is happy with Explained to her that Susie Cassette NP office reached out to her regarding A1c of 7.3  from April but she said she did not receive call Also explained to patient that A1c done June 2023 by Dr Katrinka Blazing and A1c was even higher at 7.9, stated not advised of those result either Read telephone message from those results, stated she vaguely remembered conversation She is asking for endocrinology referral with whoever Dr Duke Salvia recommended   Discussed with Dr Duke Salvia, she did not have preference of endocrinologist, referral placed   Advised patient if she does not hear from endo in 2 weeks to call back to follow up

## 2023-02-23 NOTE — Telephone Encounter (Signed)
Patient states when she had her A1C checked last year it was 7.1. Patient is requesting a new order to check her A1C this year

## 2023-03-10 DIAGNOSIS — H401122 Primary open-angle glaucoma, left eye, moderate stage: Secondary | ICD-10-CM | POA: Diagnosis not present

## 2023-03-10 DIAGNOSIS — H401113 Primary open-angle glaucoma, right eye, severe stage: Secondary | ICD-10-CM | POA: Diagnosis not present

## 2023-03-25 DIAGNOSIS — E785 Hyperlipidemia, unspecified: Secondary | ICD-10-CM | POA: Diagnosis not present

## 2023-03-25 DIAGNOSIS — E1122 Type 2 diabetes mellitus with diabetic chronic kidney disease: Secondary | ICD-10-CM | POA: Diagnosis not present

## 2023-03-25 DIAGNOSIS — N281 Cyst of kidney, acquired: Secondary | ICD-10-CM | POA: Diagnosis not present

## 2023-03-25 DIAGNOSIS — N2581 Secondary hyperparathyroidism of renal origin: Secondary | ICD-10-CM | POA: Diagnosis not present

## 2023-03-25 DIAGNOSIS — N1831 Chronic kidney disease, stage 3a: Secondary | ICD-10-CM | POA: Diagnosis not present

## 2023-03-25 DIAGNOSIS — I129 Hypertensive chronic kidney disease with stage 1 through stage 4 chronic kidney disease, or unspecified chronic kidney disease: Secondary | ICD-10-CM | POA: Diagnosis not present

## 2023-03-29 DIAGNOSIS — N1831 Chronic kidney disease, stage 3a: Secondary | ICD-10-CM | POA: Diagnosis not present

## 2023-03-30 LAB — RENAL FUNCTION PANEL: EGFR: 48

## 2023-04-04 DIAGNOSIS — E1165 Type 2 diabetes mellitus with hyperglycemia: Secondary | ICD-10-CM | POA: Diagnosis not present

## 2023-04-04 DIAGNOSIS — N1831 Chronic kidney disease, stage 3a: Secondary | ICD-10-CM | POA: Diagnosis not present

## 2023-04-04 DIAGNOSIS — E785 Hyperlipidemia, unspecified: Secondary | ICD-10-CM | POA: Diagnosis not present

## 2023-04-04 DIAGNOSIS — I10 Essential (primary) hypertension: Secondary | ICD-10-CM | POA: Diagnosis not present

## 2023-04-04 DIAGNOSIS — E039 Hypothyroidism, unspecified: Secondary | ICD-10-CM | POA: Diagnosis not present

## 2023-04-07 ENCOUNTER — Other Ambulatory Visit: Payer: Self-pay | Admitting: Internal Medicine

## 2023-04-07 DIAGNOSIS — E785 Hyperlipidemia, unspecified: Secondary | ICD-10-CM

## 2023-04-21 DIAGNOSIS — Z789 Other specified health status: Secondary | ICD-10-CM | POA: Diagnosis not present

## 2023-04-25 ENCOUNTER — Other Ambulatory Visit: Payer: Self-pay | Admitting: Internal Medicine

## 2023-04-25 DIAGNOSIS — I25810 Atherosclerosis of coronary artery bypass graft(s) without angina pectoris: Secondary | ICD-10-CM

## 2023-04-25 DIAGNOSIS — I739 Peripheral vascular disease, unspecified: Secondary | ICD-10-CM

## 2023-04-25 DIAGNOSIS — E785 Hyperlipidemia, unspecified: Secondary | ICD-10-CM

## 2023-05-07 DIAGNOSIS — J309 Allergic rhinitis, unspecified: Secondary | ICD-10-CM | POA: Diagnosis not present

## 2023-05-07 DIAGNOSIS — J019 Acute sinusitis, unspecified: Secondary | ICD-10-CM | POA: Diagnosis not present

## 2023-05-09 ENCOUNTER — Encounter (HOSPITAL_COMMUNITY): Payer: Self-pay

## 2023-05-09 ENCOUNTER — Other Ambulatory Visit: Payer: Self-pay

## 2023-05-09 ENCOUNTER — Emergency Department (HOSPITAL_COMMUNITY)
Admission: EM | Admit: 2023-05-09 | Discharge: 2023-05-10 | Disposition: A | Discharge status: Home / Self Care | Payer: Medicare Other | Attending: Emergency Medicine | Admitting: Emergency Medicine

## 2023-05-09 ENCOUNTER — Emergency Department (HOSPITAL_COMMUNITY): Payer: Medicare Other

## 2023-05-09 DIAGNOSIS — S3992XA Unspecified injury of lower back, initial encounter: Secondary | ICD-10-CM | POA: Diagnosis not present

## 2023-05-09 DIAGNOSIS — Z91040 Latex allergy status: Secondary | ICD-10-CM | POA: Diagnosis not present

## 2023-05-09 DIAGNOSIS — I10 Essential (primary) hypertension: Secondary | ICD-10-CM | POA: Diagnosis not present

## 2023-05-09 DIAGNOSIS — M545 Low back pain, unspecified: Secondary | ICD-10-CM | POA: Diagnosis present

## 2023-05-09 DIAGNOSIS — Z7982 Long term (current) use of aspirin: Secondary | ICD-10-CM | POA: Diagnosis not present

## 2023-05-09 DIAGNOSIS — M5442 Lumbago with sciatica, left side: Secondary | ICD-10-CM | POA: Insufficient documentation

## 2023-05-09 DIAGNOSIS — Z79899 Other long term (current) drug therapy: Secondary | ICD-10-CM | POA: Insufficient documentation

## 2023-05-09 MED ORDER — OXYCODONE-ACETAMINOPHEN 5-325 MG PO TABS
1.0000 | ORAL_TABLET | ORAL | Status: DC | PRN
  Administered 2023-05-09: 1 via ORAL
  Filled 2023-05-09: qty 1
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
## 2023-05-10 DIAGNOSIS — M545 Low back pain, unspecified: Secondary | ICD-10-CM | POA: Diagnosis not present

## 2023-05-10 DIAGNOSIS — M5442 Lumbago with sciatica, left side: Secondary | ICD-10-CM | POA: Diagnosis not present

## 2023-05-10 MED ORDER — METHYLPREDNISOLONE 4 MG PO TBPK: ORAL_TABLET | ORAL | 0 refills | Status: AC

## 2023-05-10 MED ORDER — DEXAMETHASONE SODIUM PHOSPHATE 10 MG/ML IJ SOLN
10.0000 mg | Freq: Once | INTRAMUSCULAR | Status: AC
  Administered 2023-05-10: 10 mg via INTRAMUSCULAR
  Filled 2023-05-10: qty 1
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
## 2023-05-10 NOTE — ED Notes (Signed)
Pt  wheeled from ed . Pt verbalized understanding of discharge instructions. Pt transferred to car.

## 2023-05-10 NOTE — Discharge Instructions (Signed)
You were evaluated today for back pain.  Your images were reassuring with no signs of fracture or dislocation.  I have prescribed a Medrol Dosepak.  Please call your ophthalmologist for clearance prior to starting this medication.  Please take Tylenol as needed for pain.  Follow-up with orthopedics for further evaluation and management as needed

## 2023-05-24 ENCOUNTER — Telehealth: Payer: Self-pay | Admitting: Cardiovascular Disease
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
## 2023-06-01 ENCOUNTER — Ambulatory Visit (INDEPENDENT_AMBULATORY_CARE_PROVIDER_SITE_OTHER): Payer: Medicare Other | Admitting: Endocrinology

## 2023-06-01 ENCOUNTER — Telehealth: Payer: Self-pay | Admitting: Endocrinology

## 2023-06-01 ENCOUNTER — Encounter: Payer: Self-pay | Admitting: Endocrinology

## 2023-06-01 VITALS — BP 132/82 | HR 85 | Resp 20 | Ht 62.0 in | Wt 188.4 lb

## 2023-06-01 DIAGNOSIS — Z7985 Long-term (current) use of injectable non-insulin antidiabetic drugs: Secondary | ICD-10-CM

## 2023-06-01 DIAGNOSIS — E1169 Type 2 diabetes mellitus with other specified complication: Secondary | ICD-10-CM

## 2023-06-01 DIAGNOSIS — E1165 Type 2 diabetes mellitus with hyperglycemia: Secondary | ICD-10-CM | POA: Diagnosis not present

## 2023-06-01 LAB — POCT GLYCOSYLATED HEMOGLOBIN (HGB A1C): Hemoglobin A1C: 7 % — AB (ref 4.0–5.6)

## 2023-06-01 MED ORDER — TIRZEPATIDE 5 MG/0.5ML ~~LOC~~ SOAJ: 5.0000 mg | SUBCUTANEOUS | 4 refills | Status: DC

## 2023-06-01 MED ORDER — TIRZEPATIDE 2.5 MG/0.5ML ~~LOC~~ SOAJ: 2.5000 mg | SUBCUTANEOUS | 0 refills | Status: DC

## 2023-06-01 MED ORDER — SEMAGLUTIDE(0.25 OR 0.5MG/DOS) 2 MG/3ML ~~LOC~~ SOPN: PEN_INJECTOR | SUBCUTANEOUS | 4 refills | Status: DC

## 2023-06-01 NOTE — Patient Instructions (Addendum)
Start mounjaro 2.5mg  weekly for 4 weeks, and increase to 5mg  weekly.    Tirzepatide Injection What is this medication? TIRZEPATIDE (tir ZEP a tide) treats type 2 diabetes. It works by increasing insulin levels in your body, which decreases your blood sugar (glucose). It also reduces the amount of sugar released into your blood and slows down your digestion. Changes to diet and exercise are often combined with this medication. This medicine may be used for other purposes; ask your health care provider or pharmacist if you have questions. COMMON BRAND NAME(S): MOUNJARO What should I tell my care team before I take this medication? They need to know if you have any of these conditions: Endocrine tumors (MEN 2) or if someone in your family had these tumors Eye disease, vision problems Gallbladder disease History of pancreatitis Kidney disease Stomach or intestine problems Thyroid cancer or if someone in your family had thyroid cancer An unusual or allergic reaction to tirzepatide, other medications, foods, dyes, or preservatives Pregnant or trying to get pregnant Breast-feeding How should I use this medication? This medication is injected under the skin. You will be taught how to prepare and give it. It is given once every week (every 7 days). Keep taking it unless your health care provider tells you to stop. If you use this medication with insulin, you should inject this medication and the insulin separately. Do not mix them together. Do not give the injections right next to each other. Change (rotate) injection sites with each injection. This medication comes with INSTRUCTIONS FOR USE. Ask your pharmacist for directions on how to use this medication. Read the information carefully. Talk to your pharmacist or care team if you have questions. It is important that you put your used needles and syringes in a special sharps container. Do not put them in a trash can. If you do not have a sharps  container, call your pharmacist or care team to get one. A special MedGuide will be given to you by the pharmacist with each prescription and refill. Be sure to read this information carefully each time. Talk to your care team about the use of this medication in children. Special care may be needed. Overdosage: If you think you have taken too much of this medicine contact a poison control center or emergency room at once. NOTE: This medicine is only for you. Do not share this medicine with others. What if I miss a dose? If you miss a dose, take it as soon as you can unless it is more than 4 days (96 hours) late. If it is more than 4 days late, skip the missed dose. Take the next dose at the normal time. Do not take 2 doses within 3 days of each other. What may interact with this medication? Alcohol Antiviral medications for HIV or AIDS Aspirin and aspirin-like medications Beta blockers, such as atenolol, metoprolol, propranolol Certain medications for blood pressure, heart disease, irregular heart beat Chromium Clonidine Diuretics Estrogen or progestin hormones Fenofibrate Gemfibrozil Guanethidine Isoniazid Lanreotide Female hormones or anabolic steroids MAOIs, such as Marplan, Nardil, and Parnate Medications for weight loss Medications for allergies, asthma, cold, or cough Medications for depression, anxiety, or mental health conditions Niacin Nicotine NSAIDs, medications for pain and inflammation, such as ibuprofen or naproxen Octreotide Other medications for diabetes, such as glyburide, glipizide, or glimepiride Pasireotide Pentamidine Phenytoin Probenecid Quinolone antibiotics, such as ciprofloxacin, levofloxacin, ofloxacin Reserpine Some herbal dietary supplements Steroid medications, such as prednisone or cortisone Sulfamethoxazole; trimethoprim Thyroid hormones  Warfarin This list may not describe all possible interactions. Give your health care provider a list of all  the medicines, herbs, non-prescription drugs, or dietary supplements you use. Also tell them if you smoke, drink alcohol, or use illegal drugs. Some items may interact with your medicine. What should I watch for while using this medication? Visit your care team for regular checks on your progress. You may need blood work done while you are taking this medication. Your care team will monitor your HbA1C (A1C). This test shows what your average blood sugar (glucose) level was over the past 2 to 3 months. Know the symptoms of low blood sugar and know how to treat it. Always carry a source of quick sugar with you. Examples include hard sugar candy or glucose tablets. Make sure others know that you can choke if you eat or drink if your blood sugar is too low and you are unable to care for yourself. Get medical help at once. Tell your care team if you have high blood sugar. Your medication dose may change if your body is under stress. Some types of stress that may affect your blood sugar include fever, infection, and surgery. Check with your care team if you have severe diarrhea, nausea, and vomiting, or if you sweat a lot. The loss of too much body fluid may make it dangerous for you to take this medication. Do not share pens or cartridges with anyone, even if the needle is changed. Each pen should only be used by one person. Sharing could cause an infection. Wear a medical ID bracelet or chain. Carry a card that describes your condition. List the medications and doses you take on the card. Talk to your care team about your risk of cancer. You may be more at risk for certain types of cancer if you take this medication. Estrogen and progestin hormones may not work as well while you are taking this medication. If you take these as pills by mouth, your care team may recommend another type of contraception for 4 weeks after you start this medication and for 4 weeks after each dose increase. Talk to your care team  about contraceptive options. They can help you find the option that works for you. What side effects may I notice from receiving this medication? Side effects that you should report to your care team as soon as possible: Allergic reactions or angioedema--skin rash, itching or hives, swelling of the face, eyes, lips, tongue, arms, or legs, trouble swallowing or breathing Change in vision Dehydration--increased thirst, dry mouth, feeling faint or lightheaded, headache, dark yellow or brown urine Gallbladder problems--severe stomach pain, nausea, vomiting, fever Kidney injury--decrease in the amount of urine, swelling of the ankles, hands, or feet Pancreatitis--severe stomach pain that spreads to your back or gets worse after eating or when touched, fever, nausea, vomiting Thoughts of suicide or self-harm, worsening mood, feelings of depression Thyroid cancer--new mass or lump in the neck, pain or trouble swallowing, trouble breathing, hoarseness Side effects that usually do not require medical attention (report these to your care team if they continue or are bothersome): Diarrhea Loss of appetite Nausea Upset stomach This list may not describe all possible side effects. Call your doctor for medical advice about side effects. You may report side effects to FDA at 1-800-FDA-1088. Where should I keep my medication? Keep out of the reach of children and pets. Refrigeration (preferred): Store in the refrigerator. Keep this medication in the original carton until you are  ready to take it. Do not freeze. Protect from light. Get rid of opened vials after use, even if there is medication left. Get rid of any unopened vials or pens after the expiration date. Room Temperature: This medication may be stored at room temperature below 30 degrees C (86 degrees F) for up to 21 days. Keep this medication in the original carton until you are ready to take it. Protect from light. Avoid exposure to extreme heat. Get  rid of opened vials after use, even if there is medication left. Get rid of any unopened vials or pens after 21 days, or after they expire, whichever is first. To get rid of medications that are no longer needed or have expired: Take the medication to a medication take-back program. Check with your pharmacy or law enforcement to find a location. If you cannot return the medication, ask your pharmacist or care team how to get rid of this medication safely. NOTE: This sheet is a summary. It may not cover all possible information. If you have questions about this medicine, talk to your doctor, pharmacist, or health care provider.  2024 [employer]vier/Gold Standard (2022-11-25 00:00:00)

## 2023-06-01 NOTE — Telephone Encounter (Signed)
Patient wanted to be sure that an order was put in for her to see nutritionist

## 2023-06-01 NOTE — Progress Notes (Signed)
Outpatient Endocrinology Note [place] [name], MD  06/01/23  Patient's Name: Carolyn Fleming    DOB: Feb 07, 1951    MRN: [id number]                                                    REASON OF VISIT: New consult for evaluation of type 2 diabetes mellitus  REFERRING PROVIDER: Chilton Si, MD  PCP: Pcp, No  HISTORY OF PRESENT ILLNESS:   Carolyn Fleming is a 72 y.o. old female with past medical history listed below, is here for evaluation of type 2 diabetes mellitus.   Pertinent Diabetes History: Patient was initially diagnosed with type 2 diabetes mellitus with hemoglobin A1c of 7.9% in June 2023, she initially tried with lifestyle modification with diet control and A1c was improved to 7.3% in April 2024.  Patient states she had tried [place] in July/August 2024 however had significant side effects including sickness and stomach upset and stopped it.  She is referred to endocrinology for evaluation and management of type 2 diabetes mellitus.  Family h/o diabetes mellitus: Type 2 diabetes mellitus in mother, sister and brother.  Chronic Diabetes Complications : Retinopathy: Unknown. Last ophthalmology exam was done on due Nephropathy: CKD III, on losartan.  Following with nephrology. Peripheral neuropathy: no Coronary artery disease: yes, s/p CABG Stroke: no  Relevant comorbidities and cardiovascular risk factors: Obesity: yes Body mass index is 34.46 kg/m.  Hypertension: yes Hyperlipidemia. Yes, on Praluent alirocumab, managed by cardiology.  Current / Home Diabetic regimen includes: None  Prior diabetic medications:  Jardiance : Stopped due to sickeness, stomach issue in July or August 2024.   Glycemic data:   Not able to download glucometer in the clinic today.  Blood sugars were reviewed from the meter directly as follows.  Glucose : 144, 134, 128, 130, 115, 117, 119, 111, 106, 114.  Hypoglycemia: Patient has no hypoglycemic episodes. Patient has hypoglycemia  awareness.  Factors modifying glucose control: 1.  Diabetic diet assessment: 3 meals a day.  2.  Staying active or exercising: No formal exercise  3.  Medication compliance: compliant n/a of the time.  Interval history 06/01/23 Patient presented to establish diabetes care in this clinic.  She has no complaints of vision problem or numbness and tingling of the feet.  Labs from outside facility on March 30, 2023 EGFR 48, creatinine 1.2, BUN 15, sodium 140, potassium 4.0, calcium 9.4, phosphorus 3.5, albumin 4.2, glucose 144, BUN 18, BUN/creatinine ratio 15  REVIEW OF SYSTEMS As per history of present illness.   PAST MEDICAL HISTORY: Past Medical History:  Diagnosis Date   Arthritis    Asthma    Breast cancer (HCC)    Right breast    GERD (gastroesophageal reflux disease)    Glaucoma    Heart disease    Hyperlipidemia    Hypertension 01/25/2012   Obesity (BMI 35.0-39.9 without comorbidity) 01/25/2012   S/P CABG x 2 01/24/2012   Emergency LIMA to LAD, SVG to OM1, EVH via right thigh   Sleep apnea    uses cpap   Thyroid disease     PAST SURGICAL HISTORY: Past Surgical History:  Procedure Laterality Date   ABDOMINAL HYSTERECTOMY     BREAST LUMPECTOMY  1997   breast cancer   CARDIAC CATHETERIZATION N/A 05/07/2015   Procedure: Left Heart Cath and  Cors/Grafts Angiography;  Surgeon: Lyn Records, MD;  Location: [facility] INVASIVE CV LAB;  Service: Cardiovascular;  Laterality: N/A;   CHOLECYSTECTOMY     CORONARY ARTERY BYPASS GRAFT  01/24/2012   Procedure: CORONARY ARTERY BYPASS GRAFTING (CABG);  Surgeon: Purcell Nails, MD;  Location: [facility] OR;  Service: Open Heart Surgery;  Laterality: N/A;  Coronary Artery bypass Graft times two utilizing the left internal mammary artery and the right greater saphenous vein harvested endoscopically,  Transesophageal Echocardiogram   LEFT HEART CATHETERIZATION WITH CORONARY ANGIOGRAM N/A 01/24/2012   Procedure: LEFT HEART CATHETERIZATION WITH CORONARY  ANGIOGRAM;  Surgeon: Lesleigh Noe, MD;  Location: [facility] CATH LAB;  Service: Cardiovascular;  Laterality: N/A;   TONSILLECTOMY     TUBAL LIGATION      ALLERGIES: Allergies  Allergen Reactions   Pilocarpine     Other Reaction(s): Other (See Comments)  Reaction: Chills (intolerance)   Acetazolamide Other (See Comments)   Allegra [Fexofenadine] Other (See Comments)    hallucinations   Amoxicillin Er Other (See Comments)   Augmentin [Amoxicillin-Pot Clavulanate]     Heart racing   Codeine Other (See Comments)    Lethargy   Fluticasone Other (See Comments)   Iodine Other (See Comments)    Per allergy test   Latex     REACTION: hives   Levofloxacin Other (See Comments)   Lisinopril Other (See Comments)   Meloxicam Other (See Comments)   Montelukast Other (See Comments)    hallucinations   Octreotide Acetate Dermatitis   Propofol Other (See Comments)   Statins Other (See Comments)    Atorvastatin, rosuvastatin   Sulfonamide Derivatives     Other Reaction(s): GI Intolerance  REACTION: gi upset   Zyrtec [Cetirizine] Other (See Comments)    Night terrors    FAMILY HISTORY:  Family History  Problem Relation Age of Onset   Cancer Mother        Colon   Diabetes Mother    Hypertension Mother    Diabetes Sister    Cancer Brother        Lung   Diabetes Brother    Hypertension Brother    Breast cancer Niece        30s    SOCIAL HISTORY: Social History   Socioeconomic History   Marital status: Single    Spouse name: Not on file   Number of children: Not on file   Years of education: Not on file   Highest education level: Not on file  Occupational History   Not on file  Tobacco Use   Smoking status: Never   Smokeless tobacco: Never  Vaping Use   Vaping status: Never Used  Substance and Sexual Activity   Alcohol use: No   Drug use: No   Sexual activity: Never    Comment: 1st intercourse- 26,  partners- 2   Other Topics Concern   Not on file  Social  History Narrative   Not on file   Social Determinants of Health   Financial Resource Strain: Not on file  Food Insecurity: Not on file  Transportation Needs: Not on file  Physical Activity: Not on file  Stress: Not on file  Social Connections: Unknown (07/19/2022)   Received from [employer]   Social Network    Social Network: Not on file    MEDICATIONS:  Current Outpatient Medications  Medication Sig Dispense Refill   Alirocumab (PRALUENT) 150 MG/ML SOAJ INJECT 1 DOSE INTO THE SKIN EVERY 14 (  FOURTEEN) DAYS. 6 mL 1   amLODipine (NORVASC) 5 MG tablet Take 1 tablet (5 mg total) by mouth daily. 90 tablet 3   aspirin 81 MG chewable tablet Chew 81 mg by mouth daily.     Azelastine HCl 0.15 % SOLN azelastine 0.15 % (205.5 mcg) nasal spray  USE 1 SPRAY INTO EACH NOSTRIL TWICE A DAY 30 mL 5   diazepam (VALIUM) 5 MG tablet Take 1 tablet (5 mg total) by mouth every 8 (eight) hours as needed (dizziness). 30 tablet 0   Dorzolamide HCl-Timolol Mal PF 2-0.5 % SOLN Place 2 % into the left eye in the morning and at bedtime.      fexofenadine (ALLEGRA ALLERGY) 60 MG tablet Take 1 tablet (60 mg total) by mouth 2 (two) times daily. 30 tablet 5   hydrocortisone cream 1 % Apply 1 application topically as needed for itching.     ketoconazole (NIZORAL) 2 % cream Apply 1 application topically as needed for irritation.     levothyroxine (SYNTHROID) 50 MCG tablet Take 50 mcg by mouth daily.     losartan-hydrochlorothiazide (HYZAAR) 50-12.5 MG tablet Take 1 tablet by mouth daily. 90 tablet 3   meclizine (ANTIVERT) 12.5 MG tablet Take 1 tablet (12.5 mg total) by mouth 3 (three) times daily as needed for dizziness. 90 tablet 0   methylPREDNISolone (MEDROL DOSEPAK) 4 MG TBPK tablet Take as directed per package instructions 21 tablet 0   nitroGLYCERIN (NITROSTAT) 0.4 MG SL tablet 0.4 mg every 5 (five) minutes as needed for chest pain.     ondansetron (ZOFRAN) 8 MG tablet TAKE 1 TABLET BY MOUTH  TWICE A DAY AS NEEDED FOR NAUSEA  1   promethazine (PHENERGAN) 25 MG tablet Take 25 mg by mouth every 4 (four) hours as needed.     Semaglutide,0.25 or 0.5MG /DOS, 2 MG/3ML SOPN Take 0.25 mg weekly for 4 weeks and increase to 0.5 mg weekly. 3 mL 4   tirzepatide (MOUNJARO) 2.5 MG/0.5ML Pen Inject 2.5 mg into the skin once a week. 2 mL 0   tirzepatide (MOUNJARO) 5 MG/0.5ML Pen Inject 5 mg into the skin once a week. Start after 4 weeks of 2.5 mg weekly injection. 6 mL 4   Travoprost, BAK Free, (TRAVATAN) 0.004 % SOLN ophthalmic solution Place 1 drop into the left eye.      No current facility-administered medications for this visit.    PHYSICAL EXAM: Vitals:   06/01/23 1314 06/01/23 1315  BP: (!) 148/80 132/82  Pulse: 85   Resp: 20   SpO2: 95%   Weight: 188 lb 6.4 oz (85.5 kg)   Height: 5\' 2"  (1.575 m)    Body mass index is 34.46 kg/m.  Wt Readings from Last 3 Encounters:  06/01/23 188 lb 6.4 oz (85.5 kg)  12/20/22 187 lb (84.8 kg)  12/06/22 189 lb 4.8 oz (85.9 kg)    General: Well developed, well nourished female in no apparent distress.  HEENT: AT/[place], no external lesions.  Eyes: Conjunctiva clear and no icterus. Neck: Neck supple  Lungs: Respirations not labored Neurologic: Alert, oriented, normal speech Extremities / Skin: Dry. Psychiatric: Does not appear depressed or anxious  Diabetic Foot Exam - Simple   No data filed    LABS Reviewed Lab Results  Component Value Date   HGBA1C 7.0 (A) 06/01/2023   HGBA1C 7.3 (H) 11/17/2022   HGBA1C 7.9 (H) 01/25/2022   No results found for: "FRUCTOSAMINE" Lab Results  Component Value Date   CHOL 149  11/17/2022   HDL 47 11/17/2022   LDLCALC 74 11/17/2022   LDLDIRECT 149.1 05/28/2013   TRIG 164 (H) 11/17/2022   CHOLHDL 3.2 11/17/2022   Lab Results  Component Value Date   MICRALBCREAT <4 11/17/2022   Lab Results  Component Value Date   CREATININE 1.26 (H) 11/17/2022   Lab Results  Component Value Date   GFR 56.03 (L)  05/02/2015    ASSESSMENT / PLAN  1. Type 2 diabetes mellitus with hyperglycemia, without long-term current use of insulin (HCC)   2. Type 2 diabetes mellitus with other specified complication, without long-term current use of insulin (HCC)     Diabetes Mellitus type 2, complicated by CKD. - Diabetic status / severity: Fair control.  Lab Results  Component Value Date   HGBA1C 7.0 (A) 06/01/2023    - Hemoglobin A1c goal : <7%  Patient is currently on diet control.  She had tried [place] Pepper was stopped due to side effects.  She agreed to try other diabetic medication.  Medication also not be limited due to CKD.  Recent EGFR 48.  I will not like to start on metformin.  No personal history of pancreatitis, no family history of medullary thyroid cancer and MEN 2 syndrome.  She would benefit from GLP-1 receptor agonist, along with diabetes control will help to lose weight and also good for her cardiovascular health.  She has history of coronary artery disease.  - Medications: See below.  I) start Mounjaro 2.5 mg weekly for 4 weeks.  Increase to 5 mg weekly.  Asked to check cost with the pharmacy.  Advised for lifestyle modification especially cutting back on carbohydrate and sugary fruits.  Discussed potential side effects especially GI related, nausea and vomiting. - Home glucose testing: At least in the morning fasting. - Discussed/ Gave Hypoglycemia treatment plan.  # Consult : Refer to dietitian/nutritionist.  # Annual urine for microalbuminuria/ creatinine ratio, no microalbuminuria currently, continue ACE/ARB /losartan.  She has CKD 3a, following with nephrology. Last  Lab Results  Component Value Date   MICRALBCREAT <4 11/17/2022    # Foot check nightly.  # Annual dilated diabetic eye exams, will advise/refer for diabetic eye exam in the future visit.  - Diet: Make healthy diabetic food choices - Life style / activity / exercise: Discussed.  2. Blood pressure  -   BP Readings from Last 1 Encounters:  06/01/23 132/82    - Control is in target.  - No change in current plans.  3. Lipid status / Hyperlipidemia - Last  Lab Results  Component Value Date   LDLCALC 74 11/17/2022   - Continue Praluent, managed by cardiology.  Diagnoses and all orders for this visit:  Type 2 diabetes mellitus with hyperglycemia, without long-term current use of insulin (HCC) -     POCT glycosylated hemoglobin (Hb A1C)  Type 2 diabetes mellitus with other specified complication, without long-term current use of insulin (HCC) -     Basic metabolic panel; Future -     Amb Referral to Nutrition and Diabetic Education  Other orders -     tirzepatide [facility]) 2.5 MG/0.5ML Pen; Inject 2.5 mg into the skin once a week. -     tirzepatide [facility]) 5 MG/0.5ML Pen; Inject 5 mg into the skin once a week. Start after 4 weeks of 2.5 mg weekly injection. -     Semaglutide,0.25 or 0.5MG /DOS, 2 MG/3ML SOPN; Take 0.25 mg weekly for 4 weeks and increase to 0.5 mg weekly.  Addendum : -Patient mentioned that Greggory Keen is expensive and, I will send Ozempic if that will be covered.  If Ozempic is also expensive we will try with other alternative that will be reasonable cost for her.  DISPOSITION Follow up in clinic in 6 weeks suggested.   All questions answered and patient verbalized understanding of the plan.  [place] Kayton Dunaj, MD [facility] Endocrinology [facility] [address] [place], [address] [place], [place] [address] Phone # [phone]  At least part of this note was generated using voice recognition software. Inadvertent word errors may have occurred, which were not recognized during the proofreading process.

## 2023-06-02 ENCOUNTER — Telehealth: Payer: Self-pay

## 2023-06-02 DIAGNOSIS — M545 Low back pain, unspecified: Secondary | ICD-10-CM | POA: Diagnosis not present

## 2023-06-02 NOTE — Telephone Encounter (Signed)
-----   Message from [place] Thapa sent at 06/01/2023 11:40 PM EDT ----- Please notify patient that I have sent prescription for Ozempic instead of Mounjaro as she has mentioned it was expensive.  [facility].  Asked patient to talk with her pharmacy what will be the cost for Ozempic and if the Ozempic is also expensive, ask to talk with pharmacy which of the other medication of the same class will be covered or with reasonable cost.

## 2023-06-02 NOTE — Telephone Encounter (Signed)
Patient called as directed by MD to make her aware Ozempic had been ordered for her, patient made aware if this medication is still too expensive she is to talk to pharmacist to find a more suitable option and call facility with the option per MD.

## 2023-06-06 DIAGNOSIS — M545 Low back pain, unspecified: Secondary | ICD-10-CM | POA: Diagnosis not present

## 2023-06-09 DIAGNOSIS — M545 Low back pain, unspecified: Secondary | ICD-10-CM | POA: Diagnosis not present

## 2023-06-13 ENCOUNTER — Ambulatory Visit: Payer: Medicare Other | Admitting: Endocrinology

## 2023-06-13 DIAGNOSIS — M545 Low back pain, unspecified: Secondary | ICD-10-CM | POA: Diagnosis not present

## 2023-06-15 ENCOUNTER — Telehealth: Payer: Self-pay

## 2023-06-15 NOTE — Telephone Encounter (Signed)
I had suggested Mounjaro.  Ask patient to check with her pharmacy for any other similar to [facility] medication would be covered for example Ozempic or Trulicity.  If they are also expensive we will have 2 options either we can do any other oral diabetes medication or she can monitor blood sugar without medication.  She had improvement on diabetes control without medication in last few months.  Carolyn Aminta Sakurai, MD [facility] Endocrinology [facility] [address] [place], [address] [place], [place] [address] Phone # [phone]

## 2023-06-15 NOTE — Telephone Encounter (Signed)
Patient stated she called pharmacy inquiring about all new medications suggested and they are all over $200. Will speak with MD for advisement.

## 2023-06-16 ENCOUNTER — Telehealth: Payer: Self-pay

## 2023-06-16 DIAGNOSIS — M545 Low back pain, unspecified: Secondary | ICD-10-CM | POA: Diagnosis not present

## 2023-06-16 NOTE — Telephone Encounter (Signed)
Patient called with options for DM treatment as directed by MD. Patient preferred oral agents due to the high cost of the injections offered.

## 2023-06-17 MED ORDER — SITAGLIPTIN PHOSPHATE 25 MG PO TABS: 25.0000 mg | ORAL_TABLET | Freq: Every day | ORAL | 3 refills | Status: DC

## 2023-06-17 NOTE — Telephone Encounter (Signed)
I sent prescription for Januvia 25 mg daily.

## 2023-06-17 NOTE — Addendum Note (Signed)
Addended by: Sabriyah Wilcher, [place] on: 06/17/2023 11:55 AM   Modules accepted: Orders

## 2023-06-20 DIAGNOSIS — M545 Low back pain, unspecified: Secondary | ICD-10-CM | POA: Diagnosis not present

## 2023-06-22 ENCOUNTER — Other Ambulatory Visit: Payer: Self-pay

## 2023-06-23 DIAGNOSIS — M545 Low back pain, unspecified: Secondary | ICD-10-CM | POA: Diagnosis not present

## 2023-06-27 DIAGNOSIS — M545 Low back pain, unspecified: Secondary | ICD-10-CM | POA: Diagnosis not present

## 2023-06-30 DIAGNOSIS — M545 Low back pain, unspecified: Secondary | ICD-10-CM | POA: Diagnosis not present

## 2023-07-04 DIAGNOSIS — H401122 Primary open-angle glaucoma, left eye, moderate stage: Secondary | ICD-10-CM | POA: Diagnosis not present

## 2023-07-04 DIAGNOSIS — H401113 Primary open-angle glaucoma, right eye, severe stage: Secondary | ICD-10-CM | POA: Diagnosis not present

## 2023-07-11 ENCOUNTER — Ambulatory Visit: Payer: Medicare Other | Admitting: Endocrinology

## 2023-07-18 ENCOUNTER — Encounter (HOSPITAL_BASED_OUTPATIENT_CLINIC_OR_DEPARTMENT_OTHER): Payer: Self-pay

## 2023-07-18 ENCOUNTER — Other Ambulatory Visit: Payer: Self-pay

## 2023-07-18 ENCOUNTER — Emergency Department (HOSPITAL_BASED_OUTPATIENT_CLINIC_OR_DEPARTMENT_OTHER)
Admission: EM | Admit: 2023-07-18 | Discharge: 2023-07-18 | Disposition: A | Discharge status: Home / Self Care | Payer: Medicare Other | Attending: Emergency Medicine | Admitting: Emergency Medicine

## 2023-07-18 DIAGNOSIS — I129 Hypertensive chronic kidney disease with stage 1 through stage 4 chronic kidney disease, or unspecified chronic kidney disease: Secondary | ICD-10-CM | POA: Diagnosis not present

## 2023-07-18 DIAGNOSIS — R9431 Abnormal electrocardiogram [ECG] [EKG]: Secondary | ICD-10-CM | POA: Diagnosis not present

## 2023-07-18 DIAGNOSIS — M545 Low back pain, unspecified: Secondary | ICD-10-CM | POA: Diagnosis not present

## 2023-07-18 DIAGNOSIS — M5441 Lumbago with sciatica, right side: Secondary | ICD-10-CM | POA: Diagnosis not present

## 2023-07-18 DIAGNOSIS — Z91040 Latex allergy status: Secondary | ICD-10-CM | POA: Diagnosis not present

## 2023-07-18 DIAGNOSIS — Z853 Personal history of malignant neoplasm of breast: Secondary | ICD-10-CM | POA: Insufficient documentation

## 2023-07-18 DIAGNOSIS — M79601 Pain in right arm: Secondary | ICD-10-CM | POA: Diagnosis not present

## 2023-07-18 DIAGNOSIS — E1122 Type 2 diabetes mellitus with diabetic chronic kidney disease: Secondary | ICD-10-CM | POA: Diagnosis not present

## 2023-07-18 DIAGNOSIS — R110 Nausea: Secondary | ICD-10-CM | POA: Diagnosis not present

## 2023-07-18 DIAGNOSIS — R3915 Urgency of urination: Secondary | ICD-10-CM | POA: Diagnosis not present

## 2023-07-18 DIAGNOSIS — M5416 Radiculopathy, lumbar region: Secondary | ICD-10-CM

## 2023-07-18 DIAGNOSIS — Z79899 Other long term (current) drug therapy: Secondary | ICD-10-CM | POA: Diagnosis not present

## 2023-07-18 DIAGNOSIS — Z7982 Long term (current) use of aspirin: Secondary | ICD-10-CM | POA: Diagnosis not present

## 2023-07-18 DIAGNOSIS — N189 Chronic kidney disease, unspecified: Secondary | ICD-10-CM | POA: Diagnosis not present

## 2023-07-18 DIAGNOSIS — Z951 Presence of aortocoronary bypass graft: Secondary | ICD-10-CM | POA: Diagnosis not present

## 2023-07-18 DIAGNOSIS — L02223 Furuncle of chest wall: Secondary | ICD-10-CM | POA: Diagnosis not present

## 2023-07-18 LAB — COMPREHENSIVE METABOLIC PANEL
ALT: 17 U/L (ref 0–44)
AST: 20 U/L (ref 15–41)
Albumin: 3.9 g/dL (ref 3.5–5.0)
Alkaline Phosphatase: 62 U/L (ref 38–126)
Anion gap: 7 (ref 5–15)
BUN: 15 mg/dL (ref 8–23)
CO2: 26 mmol/L (ref 22–32)
Calcium: 9.3 mg/dL (ref 8.9–10.3)
Chloride: 107 mmol/L (ref 98–111)
Creatinine, Ser: 1.13 mg/dL — ABNORMAL HIGH (ref 0.44–1.00)
GFR, Estimated: 52 mL/min — ABNORMAL LOW (ref >60)
Glucose, Bld: 108 mg/dL — ABNORMAL HIGH (ref 70–99)
Potassium: 3.9 mmol/L (ref 3.5–5.1)
Sodium: 140 mmol/L (ref 135–145)
Total Bilirubin: 0.4 mg/dL (ref <1.2)
Total Protein: 7 g/dL (ref 6.5–8.1)

## 2023-07-18 LAB — CBC WITH DIFFERENTIAL/PLATELET
Abs Immature Granulocytes: 0 10*3/uL (ref 0.00–0.07)
Basophils Absolute: 0.1 10*3/uL (ref 0.0–0.1)
Basophils Relative: 1 %
Eosinophils Absolute: 0.3 10*3/uL (ref 0.0–0.5)
Eosinophils Relative: 6 %
HCT: 41.2 % (ref 36.0–46.0)
Hemoglobin: 13.9 g/dL (ref 12.0–15.0)
Immature Granulocytes: 0 %
Lymphocytes Relative: 38 %
Lymphs Abs: 1.7 10*3/uL (ref 0.7–4.0)
MCH: 29 pg (ref 26.0–34.0)
MCHC: 33.7 g/dL (ref 30.0–36.0)
MCV: 85.8 fL (ref 80.0–100.0)
Monocytes Absolute: 0.5 10*3/uL (ref 0.1–1.0)
Monocytes Relative: 10 %
Neutro Abs: 2.1 10*3/uL (ref 1.7–7.7)
Neutrophils Relative %: 45 %
Platelets: 217 10*3/uL (ref 150–400)
RBC: 4.8 MIL/uL (ref 3.87–5.11)
RDW: 13.8 % (ref 11.5–15.5)
WBC: 4.6 10*3/uL (ref 4.0–10.5)
nRBC: 0 % (ref 0.0–0.2)

## 2023-07-18 LAB — TROPONIN I (HIGH SENSITIVITY)
Troponin I (High Sensitivity): 20 ng/L — ABNORMAL HIGH (ref <18)
Troponin I (High Sensitivity): 18 ng/L — ABNORMAL HIGH (ref <18)

## 2023-07-18 MED ORDER — HYDROCODONE-ACETAMINOPHEN 5-325 MG PO TABS
1.0000 | ORAL_TABLET | Freq: Once | ORAL | Status: DC
  Filled 2023-07-18: qty 1

## 2023-07-18 MED ORDER — LIDOCAINE 5 % EX PTCH: 1.0000 | MEDICATED_PATCH | CUTANEOUS | 0 refills | Status: AC

## 2023-07-18 MED ORDER — LIDOCAINE 5 % EX PTCH
2.0000 | MEDICATED_PATCH | CUTANEOUS | Status: DC
  Administered 2023-07-18: 2 via TRANSDERMAL
  Filled 2023-07-18: qty 2

## 2023-07-18 NOTE — ED Notes (Signed)
Reviewed discharge instructions, medications, and home care with pt. Pt verbalized understanding and had no further questions. Pt exited ED without complications.
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
## 2023-07-18 NOTE — ED Notes (Signed)
Pt refused urine sample at this time. Stated that she provided urine sample at doctors appointment earlier today. MD notified.
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
## 2023-07-18 NOTE — Discharge Instructions (Addendum)
Thank for letting us evaluate you today.  We have controlled your back and leg pain with lidocaine patches.  Your lab work was negative for heart injury or infection.  Your kidney function is improved from 8 months ago.  Please make sure to follow-up with your Ortho appointment on Friday for further management.  I provided you with sciatica stretches and [facility]ocaine prescription to [facility] in [place] [address] to try and help with symptoms  Please return to emergency department if you experience numbness or tingling in groin region, urinary or fecal incontinence, worsening pain

## 2023-07-22 DIAGNOSIS — M545 Low back pain, unspecified: Secondary | ICD-10-CM | POA: Diagnosis not present

## 2023-08-03 ENCOUNTER — Encounter (HOSPITAL_BASED_OUTPATIENT_CLINIC_OR_DEPARTMENT_OTHER): Payer: Medicare Other | Admitting: Nurse Practitioner

## 2023-08-22 DIAGNOSIS — Z1321 Encounter for screening for nutritional disorder: Secondary | ICD-10-CM | POA: Diagnosis not present

## 2023-08-22 DIAGNOSIS — Z1231 Encounter for screening mammogram for malignant neoplasm of breast: Secondary | ICD-10-CM | POA: Diagnosis not present

## 2023-08-22 DIAGNOSIS — Z6835 Body mass index (BMI) 35.0-35.9, adult: Secondary | ICD-10-CM | POA: Diagnosis not present

## 2023-08-22 DIAGNOSIS — Z131 Encounter for screening for diabetes mellitus: Secondary | ICD-10-CM | POA: Diagnosis not present

## 2023-08-22 DIAGNOSIS — Z13220 Encounter for screening for lipoid disorders: Secondary | ICD-10-CM | POA: Diagnosis not present

## 2023-08-22 DIAGNOSIS — Z1329 Encounter for screening for other suspected endocrine disorder: Secondary | ICD-10-CM | POA: Diagnosis not present

## 2023-08-22 DIAGNOSIS — R799 Abnormal finding of blood chemistry, unspecified: Secondary | ICD-10-CM | POA: Diagnosis not present

## 2023-08-22 DIAGNOSIS — R7309 Other abnormal glucose: Secondary | ICD-10-CM | POA: Diagnosis not present

## 2023-08-22 DIAGNOSIS — Z01419 Encounter for gynecological examination (general) (routine) without abnormal findings: Secondary | ICD-10-CM | POA: Diagnosis not present

## 2023-08-22 DIAGNOSIS — E039 Hypothyroidism, unspecified: Secondary | ICD-10-CM | POA: Diagnosis not present

## 2023-08-22 DIAGNOSIS — Z13228 Encounter for screening for other metabolic disorders: Secondary | ICD-10-CM | POA: Diagnosis not present

## 2023-09-13 DIAGNOSIS — Z951 Presence of aortocoronary bypass graft: Secondary | ICD-10-CM | POA: Diagnosis not present

## 2023-09-13 DIAGNOSIS — I2510 Atherosclerotic heart disease of native coronary artery without angina pectoris: Secondary | ICD-10-CM | POA: Diagnosis not present

## 2023-09-13 DIAGNOSIS — E1165 Type 2 diabetes mellitus with hyperglycemia: Secondary | ICD-10-CM | POA: Diagnosis not present

## 2023-09-13 DIAGNOSIS — J0110 Acute frontal sinusitis, unspecified: Secondary | ICD-10-CM | POA: Diagnosis not present

## 2023-09-13 DIAGNOSIS — I10 Essential (primary) hypertension: Secondary | ICD-10-CM | POA: Diagnosis not present

## 2023-09-16 DIAGNOSIS — M25562 Pain in left knee: Secondary | ICD-10-CM | POA: Diagnosis not present

## 2023-09-19 ENCOUNTER — Telehealth: Payer: Self-pay | Admitting: Cardiovascular Disease

## 2023-09-19 NOTE — Telephone Encounter (Signed)
Pt would like a c/b regarding whether it is okay for her to take a topical ointment that was prescribed for her leg and knee. Medication is Pennsaid Ointment. Please advise

## 2023-09-19 NOTE — Telephone Encounter (Signed)
Will forward for pharmacist review

## 2023-09-20 NOTE — Telephone Encounter (Signed)
Left message for patient of recommendations. 

## 2023-09-26 DIAGNOSIS — E785 Hyperlipidemia, unspecified: Secondary | ICD-10-CM | POA: Diagnosis not present

## 2023-09-26 DIAGNOSIS — Z951 Presence of aortocoronary bypass graft: Secondary | ICD-10-CM | POA: Diagnosis not present

## 2023-09-26 DIAGNOSIS — N1831 Chronic kidney disease, stage 3a: Secondary | ICD-10-CM | POA: Diagnosis not present

## 2023-09-26 DIAGNOSIS — E1122 Type 2 diabetes mellitus with diabetic chronic kidney disease: Secondary | ICD-10-CM | POA: Diagnosis not present

## 2023-09-26 DIAGNOSIS — I129 Hypertensive chronic kidney disease with stage 1 through stage 4 chronic kidney disease, or unspecified chronic kidney disease: Secondary | ICD-10-CM | POA: Diagnosis not present

## 2023-09-29 ENCOUNTER — Other Ambulatory Visit (HOSPITAL_BASED_OUTPATIENT_CLINIC_OR_DEPARTMENT_OTHER): Payer: Self-pay | Admitting: Cardiovascular Disease

## 2023-10-13 DIAGNOSIS — H401113 Primary open-angle glaucoma, right eye, severe stage: Secondary | ICD-10-CM | POA: Diagnosis not present

## 2023-10-13 DIAGNOSIS — H401122 Primary open-angle glaucoma, left eye, moderate stage: Secondary | ICD-10-CM | POA: Diagnosis not present

## 2023-10-14 DIAGNOSIS — J0110 Acute frontal sinusitis, unspecified: Secondary | ICD-10-CM | POA: Diagnosis not present

## 2023-10-24 DIAGNOSIS — H401113 Primary open-angle glaucoma, right eye, severe stage: Secondary | ICD-10-CM | POA: Diagnosis not present

## 2023-10-24 DIAGNOSIS — H401122 Primary open-angle glaucoma, left eye, moderate stage: Secondary | ICD-10-CM | POA: Diagnosis not present

## 2023-10-26 DIAGNOSIS — E1122 Type 2 diabetes mellitus with diabetic chronic kidney disease: Secondary | ICD-10-CM | POA: Diagnosis not present

## 2023-10-26 DIAGNOSIS — I129 Hypertensive chronic kidney disease with stage 1 through stage 4 chronic kidney disease, or unspecified chronic kidney disease: Secondary | ICD-10-CM | POA: Diagnosis not present

## 2023-10-26 DIAGNOSIS — N1831 Chronic kidney disease, stage 3a: Secondary | ICD-10-CM | POA: Diagnosis not present

## 2023-11-08 DIAGNOSIS — R197 Diarrhea, unspecified: Secondary | ICD-10-CM | POA: Diagnosis not present

## 2023-11-09 ENCOUNTER — Other Ambulatory Visit: Payer: Self-pay | Admitting: Cardiovascular Disease

## 2023-11-09 DIAGNOSIS — E785 Hyperlipidemia, unspecified: Secondary | ICD-10-CM

## 2023-11-09 DIAGNOSIS — I25810 Atherosclerosis of coronary artery bypass graft(s) without angina pectoris: Secondary | ICD-10-CM

## 2023-11-09 DIAGNOSIS — I739 Peripheral vascular disease, unspecified: Secondary | ICD-10-CM

## 2023-11-10 DIAGNOSIS — E66811 Obesity, class 1: Secondary | ICD-10-CM | POA: Diagnosis not present

## 2023-11-10 DIAGNOSIS — R1084 Generalized abdominal pain: Secondary | ICD-10-CM | POA: Diagnosis not present

## 2023-11-10 DIAGNOSIS — R109 Unspecified abdominal pain: Secondary | ICD-10-CM | POA: Diagnosis not present

## 2023-12-05 DIAGNOSIS — H401113 Primary open-angle glaucoma, right eye, severe stage: Secondary | ICD-10-CM | POA: Diagnosis not present

## 2023-12-05 DIAGNOSIS — H401122 Primary open-angle glaucoma, left eye, moderate stage: Secondary | ICD-10-CM | POA: Diagnosis not present

## 2023-12-12 DIAGNOSIS — I10 Essential (primary) hypertension: Secondary | ICD-10-CM | POA: Diagnosis not present

## 2023-12-12 DIAGNOSIS — E1165 Type 2 diabetes mellitus with hyperglycemia: Secondary | ICD-10-CM | POA: Diagnosis not present

## 2023-12-12 DIAGNOSIS — L309 Dermatitis, unspecified: Secondary | ICD-10-CM | POA: Diagnosis not present

## 2023-12-21 ENCOUNTER — Telehealth: Payer: Self-pay | Admitting: Cardiovascular Disease

## 2023-12-21 NOTE — Progress Notes (Unsigned)
 Carolyn Fleming

## 2023-12-21 NOTE — Telephone Encounter (Signed)
 Patient calling to get an appt with Dr. Theodis Fiscal advise patient she doesn't have anything. Patient ask to speak with the nurse, states it is important. Please advise
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
## 2023-12-22 ENCOUNTER — Encounter: Payer: Self-pay | Admitting: Neurology

## 2023-12-22 ENCOUNTER — Ambulatory Visit: Payer: Medicare Other | Admitting: Neurology

## 2023-12-22 VITALS — BP 136/86 | HR 67 | Ht 62.0 in | Wt 186.0 lb

## 2023-12-22 DIAGNOSIS — Z951 Presence of aortocoronary bypass graft: Secondary | ICD-10-CM | POA: Diagnosis not present

## 2023-12-22 DIAGNOSIS — G4733 Obstructive sleep apnea (adult) (pediatric): Secondary | ICD-10-CM | POA: Diagnosis not present

## 2023-12-22 DIAGNOSIS — I5042 Chronic combined systolic (congestive) and diastolic (congestive) heart failure: Secondary | ICD-10-CM

## 2023-12-22 DIAGNOSIS — E039 Hypothyroidism, unspecified: Secondary | ICD-10-CM

## 2023-12-22 NOTE — Progress Notes (Signed)
 Provider:  Neomia Banner, MD  Primary Care Physician:  Pcp, No No address on file     Referring Provider: Susanna Epley, Fnp [address] [place],  [place] [address]          Chief Complaint according to patient   Patient presents with:                HISTORY OF PRESENT ILLNESS:  Carolyn Fleming is a 73 y.o. female patient who is here for revisit 12/22/2023 for  CPAP compliance.   Her cardiologist has encouraged her to use CPAP, as she has CAD status post bypass surgery.  DM2 not on meds,  CKD 3 nephropathy, HTN and OSA on CPAP>,open angle glaucoma.   Chief concern according to patient :  " nothing new- CPAP compliance:  The patient endorsed the lowest score possible on the fatigue severity scale today 9 out of 1063 points and her Epworth sleepiness scale is endorsed at 0 out of 24 points.  Her current CPAP is an AirSense 10 auto machine with a setting between 4 and 12 cm water and without any expiratory relief.  She has been 100% compliant by days and only felt short 1 day just below 4 hours.  Her average use of time is 8 hours 42 minutes, she does have a high air leak but her 95th percentile pressure is 11 cm water and her residual AHI only 0.7/h.  She wants to change from nasal pillows,  dream wear nasal pillow and she feels that it rubs her scalp and pulls on her hair.      Last visit : Carolyn Fleming is a 73 y.o. female patient who is seen upon referral on 12/20/2022 from Susanna Epley, NP , for a TOC  for OSA on CPAP, [facility] patient until now.  Chief concern according to patient :  " I was living in [place] but my son visited me and noted and remarked upon my breathing at night snoring and pausing in my breath.  This led me to be referred to Dr. Karlene Overcast for sleep study.  At the time I also worked as a Scientist, physiological at [employer].  The sleep study was positive for apnea and I started CPAP treatment.      Review of Systems: Out of a complete  14 system review, the patient complains of only the following symptoms, and all other reviewed systems are negative.:   FSS 9. ESS 0 points.   Social History   Socioeconomic History   Marital status: Single    Spouse name: Not on file   Number of children: Not on file   Years of education: Not on file   Highest education level: Not on file  Occupational History   Not on file  Tobacco Use   Smoking status: Never   Smokeless tobacco: Never  Vaping Use   Vaping status: Never Used  Substance and Sexual Activity   Alcohol  use: No   Drug use: No   Sexual activity: Never    Comment: 1st intercourse- 10,  partners- 2   Other Topics Concern   Not on file  Social History Narrative   Not on file   Social Drivers of Health   Financial Resource Strain: Not on file  Food Insecurity: Low Risk  (12/12/2023)   Received from [facility]   Hunger Vital Sign    Worried About Running Out of Food in the  Last Year: Never true    Ran Out of Food in the Last Year: Never true  Transportation Needs: No Transportation Needs (12/12/2023)   Received from [facility]    In the past 12 months, has lack of reliable transportation kept you from medical appointments, meetings, work or from getting things needed for daily living? : No  Physical Activity: Not on file  Stress: Not on file  Social Connections: Unknown (07/19/2022)   [facility] from [facility]   Social Network    Social Network: Not on file    Family History  Problem Relation Age of Onset   Cancer Mother        Colon   Diabetes Mother    Hypertension Mother    Diabetes Sister    Cancer Brother        Lung   Diabetes Brother    Hypertension Brother    Breast cancer Niece        30s    Past Medical History:  Diagnosis Date   Arthritis    Asthma    Breast cancer (HCC)    Right breast    GERD (gastroesophageal reflux disease)    Glaucoma    Heart disease    Hyperlipidemia     Hypertension 01/25/2012   Obesity (BMI 35.0-39.9 without comorbidity) 01/25/2012   S/P CABG x 2 01/24/2012   Emergency LIMA to LAD, SVG to OM1, EVH via right thigh   Sleep apnea    uses cpap   Thyroid  disease     Past Surgical History:  Procedure Laterality Date   ABDOMINAL HYSTERECTOMY     BREAST LUMPECTOMY  1997   breast cancer   CARDIAC CATHETERIZATION N/A 05/07/2015   Procedure: Left Heart Cath and Cors/Grafts Angiography;  Surgeon: Arty Binning, MD;  Location: [facility] INVASIVE CV LAB;  Service: Cardiovascular;  Laterality: N/A;   CHOLECYSTECTOMY     CORONARY ARTERY BYPASS GRAFT  01/24/2012   Procedure: CORONARY ARTERY BYPASS GRAFTING (CABG);  Surgeon: Gardenia Jump, MD;  Location[facility] OR;  Service: Open Heart Surgery;  Laterality: N/A;  Coronary Artery bypass Graft times two utilizing the left internal mammary artery and the right greater saphenous vein harvested endoscopically,  Transesophageal Echocardiogram   LEFT HEART CATHETERIZATION WITH CORONARY ANGIOGRAM N/A 01/24/2012   Procedure: LEFT HEART CATHETERIZATION WITH CORONARY ANGIOGRAM;  Surgeon: Mickiel Albany, MD;  Location[facility] CATH LAB;  Service: Cardiovascular;  Laterality: N/A;   TONSILLECTOMY     TUBAL LIGATION       Current Outpatient Medications on File Prior to Visit  Medication Sig Dispense Refill   Alirocumab  (PRALUENT ) 150 MG/ML SOAJ INJECT 1 DOSE INTO THE SKIN EVERY 14 (FOURTEEN) DAYS. 6 mL 1   amLODipine  (NORVASC ) 5 MG tablet TAKE 1 TABLET (5 MG TOTAL) BY MOUTH DAILY. 90 tablet 3   aspirin  81 MG chewable tablet Chew 81 mg by mouth daily.     Azelastine  HCl 0.15 % SOLN azelastine  0.15 % (205.5 mcg) nasal spray  USE 1 SPRAY INTO EACH NOSTRIL TWICE A DAY 30 mL 5   diazepam  (VALIUM ) 5 MG tablet Take 1 tablet (5 mg total) by mouth every 8 (eight) hours as needed (dizziness). 30 tablet 0   Dorzolamide  HCl-Timolol  Mal PF 2-0.5 % SOLN Place 2 % into the left eye in the morning and at bedtime.      hydrocortisone cream 1 %  Apply 1 application topically as needed for itching.  ketoconazole (NIZORAL) 2 % cream Apply 1 application topically as needed for irritation.     levothyroxine (SYNTHROID) 50 MCG tablet Take 50 mcg by mouth daily.     losartan -hydrochlorothiazide (HYZAAR) 50-12.5 MG tablet TAKE 1 TABLET BY MOUTH EVERY DAY 90 tablet 3   meclizine  (ANTIVERT ) 12.5 MG tablet Take 1 tablet (12.5 mg total) by mouth 3 (three) times daily as needed for dizziness. 90 tablet 0   methylPREDNISolone  (MEDROL  DOSEPAK) 4 MG TBPK tablet Take as directed per package instructions 21 tablet 0   nitroGLYCERIN  (NITROSTAT ) 0.4 MG SL tablet 0.4 mg every 5 (five) minutes as needed for chest pain.     ondansetron  (ZOFRAN ) 8 MG tablet TAKE 1 TABLET BY MOUTH TWICE A DAY AS NEEDED FOR NAUSEA  1   promethazine (PHENERGAN) 25 MG tablet Take 25 mg by mouth every 4 (four) hours as needed.     Semaglutide ,0.25 or 0.5MG /DOS, 2 MG/3ML SOPN Take 0.25 mg weekly for 4 weeks and increase to 0.5 mg weekly. 3 mL 4   sitaGLIPtin  (JANUVIA ) 25 MG tablet Take 1 tablet (25 mg total) by mouth daily. 90 tablet 3   Travoprost, BAK Free, (TRAVATAN) 0.004 % SOLN ophthalmic solution Place 1 drop into the left eye.      No current facility-administered medications on file prior to visit.    Allergies  Allergen Reactions   Pilocarpine     Other Reaction(s): Other (See Comments)  Reaction: Chills (intolerance)   Acetazolamide Other (See Comments)   Allegra  [Fexofenadine ] Other (See Comments)    hallucinations   Amoxicillin Er Other (See Comments)   Augmentin [Amoxicillin-Pot Clavulanate]     Heart racing   Codeine Other (See Comments)    Lethargy   Fluticasone  Other (See Comments)   Iodine Other (See Comments)    Per allergy test   Latex     REACTION: hives   Levofloxacin Other (See Comments)   Lisinopril  Other (See Comments)   Meloxicam Other (See Comments)   Montelukast  Other (See Comments)    hallucinations   Octreotide Acetate Dermatitis    Propofol  Other (See Comments)   Statins Other (See Comments)    Atorvastatin , rosuvastatin   Sulfonamide Derivatives     Other Reaction(s): GI Intolerance  REACTION: gi upset   Zyrtec [Cetirizine] Other (See Comments)    Night terrors     DIAGNOSTIC DATA (LABS, IMAGING, TESTING) - I reviewed patient records, labs, notes, testing and imaging myself where available.  Lab Results  Component Value Date   WBC 4.6 07/18/2023   HGB 13.9 07/18/2023   HCT 41.2 07/18/2023   MCV 85.8 07/18/2023   PLT 217 07/18/2023      Component Value Date/Time   NA 140 07/18/2023 1355   NA 140 11/17/2022 1556   K 3.9 07/18/2023 1355   CL 107 07/18/2023 1355   CO2 26 07/18/2023 1355   GLUCOSE 108 (H) 07/18/2023 1355   BUN 15 07/18/2023 1355   BUN 19 11/17/2022 1556   CREATININE 1.13 (H) 07/18/2023 1355   CALCIUM  9.3 07/18/2023 1355   PROT 7.0 07/18/2023 1355   PROT 7.0 11/17/2022 1556   ALBUMIN  3.9 07/18/2023 1355   ALBUMIN  4.3 11/17/2022 1556   AST 20 07/18/2023 1355   ALT 17 07/18/2023 1355   ALKPHOS 62 07/18/2023 1355   BILITOT 0.4 07/18/2023 1355   BILITOT 0.4 11/17/2022 1556   GFRNONAA 52 (L) 07/18/2023 1355   GFRAA 50 (L) 02/22/2018 0910   Lab Results  Component  Value Date   CHOL 149 11/17/2022   HDL 47 11/17/2022   LDLCALC 74 11/17/2022   LDLDIRECT 149.1 05/28/2013   TRIG 164 (H) 11/17/2022   CHOLHDL 3.2 11/17/2022   Lab Results  Component Value Date   HGBA1C 7.0 (A) 06/01/2023   No results found for: "VITAMINB12" Lab Results  Component Value Date   TSH 0.827 11/17/2022    PHYSICAL EXAM:  Today's Vitals   12/22/23 0911  BP: 136/86  Pulse: 67  Weight: 186 lb (84.4 kg)  Height: 5\' 2"  (1.575 m)   Body mass index is 34.02 kg/m.   Wt Readings from Last 3 Encounters:  12/22/23 186 lb (84.4 kg)  07/18/23 180 lb (81.6 kg)  06/01/23 188 lb 6.4 oz (85.5 kg)     Ht Readings from Last 3 Encounters:  12/22/23 5\' 2"  (1.575 m)  07/18/23 5\' 2"  (1.575 m)  06/01/23  5\' 2"  (1.575 m)      General: The patient is awake, alert and appears not in acute distress. The patient is well groomed. Head: Normocephalic, atraumatic. Neck is supple. Normocephalic, atraumatic. Neck is supple. Mallampati 2,  small, lateral crowding.  neck circumference:15.5 inches . Nasal airflow patent.  Right nasion is smaller. Retrognathia is  seen.  Dental status: biological  Cardiovascular:  Regular rate and cardiac rhythm by pulse,  without distended neck veins. Respiratory: Lungs are clear to auscultation.  Skin:  Without evidence of ankle edema, or rash. Trunk: The patient's posture is erect.   NEUROLOGIC EXAM: The patient is awake and alert, oriented to place and time.   Memory subjective described as intact.  Attention span & concentration ability appears normal.  Speech is fluent,  without  dysarthria, dysphonia or aphasia.  Mood and affect are appropriate.   Cranial nerves: no loss of smell or taste reported  Right eye trauma, tearing, ptosis,  glaucoma.    Funduscopic exam deferred.  Extraocular movements in vertical and horizontal planes were intact and without nystagmus. No Diplopia. Visual fields by finger perimetry are intact. Hearing was intact to soft voice and finger rubbing.  Facial sensation intact to fine touch.  Facial motor strength is symmetric and tongue and uvula move midline.  Neck ROM : rotation, tilt and flexion extension were normal for age and shoulder shrug was symmetrical.    Motor exam:  Symmetric bulk, tone and ROM.   Normal tone without cog-wheeling, symmetric grip strength .   Sensory: Vibration intact at both ankles.    Proprioception tested in the upper extremities was normal.   Coordination: Rapid alternating movements in the fingers/hands were of normal speed.  The Finger-to-nose maneuver was intact without evidence of ataxia, dysmetria or tremor.   Gait and station: Patient could rise unassisted from a seated position, walked  without assistive device.  Stance is of normal width/ base , slightly stooped. .  Toe and heel walk were deferred.  Deep tendon reflexes: in the  upper and lower extremities are symmetric and intact.  Babinski response was deferred      ASSESSMENT AND PLAN:  73 y.o. year old female  here with:  Problems with mask fit on CPAP for OSA     1) highly compliant use of CPAP for OSA, baseline study was at [facility]/ we don't have a baseline . Age of the current machine is about 3 years , she estimated.   2) high air leak - on nasal pillows.    3) We fitted for a , medium  size  resmed N 20 airtouch    I plan to follow up either personally or through our NP within 12 months.   I would like to thank  Susanna Epley, Fnp [address] [place],  [place] [address] for allowing me to meet with and to take care of this pleasant patient.     After spending a total time of  30  minutes face to face and additional time for physical and neurologic examination, review of laboratory studies,  personal review of imaging studies, reports and results of other testing and review of referral information / records as far as provided in visit,   Electronically signed by: Neomia Banner, MD 12/22/2023 9:45 AM  [facility] Board certified by The [employer] of Sleep Medicine and Diplomate of the [employer] of Sleep Medicine. Board certified In Neurology through the ABPN, Fellow of the [employer] of Neu[employer].

## 2023-12-26 ENCOUNTER — Ambulatory Visit (HOSPITAL_BASED_OUTPATIENT_CLINIC_OR_DEPARTMENT_OTHER): Payer: Medicare Other | Admitting: Family

## 2024-01-05 DIAGNOSIS — H401113 Primary open-angle glaucoma, right eye, severe stage: Secondary | ICD-10-CM | POA: Diagnosis not present

## 2024-01-05 DIAGNOSIS — H401122 Primary open-angle glaucoma, left eye, moderate stage: Secondary | ICD-10-CM | POA: Diagnosis not present

## 2024-02-07 ENCOUNTER — Telehealth: Payer: Self-pay | Admitting: Cardiovascular Disease

## 2024-02-07 MED ORDER — NITROGLYCERIN 0.4 MG SL SUBL: 0.4000 mg | SUBLINGUAL_TABLET | SUBLINGUAL | 0 refills | Status: AC | PRN

## 2024-02-07 NOTE — Telephone Encounter (Signed)
 Pt's medication was sent to pt's pharmacy as requested. Confirmation received.

## 2024-02-07 NOTE — Telephone Encounter (Signed)
*  STAT* If patient is at the pharmacy, call can be transferred to refill team.   1. Which medications need to be refilled? (please list name of each medication and dose if known) nitroGLYCERIN  (NITROSTAT ) 0.4 MG SL tablet   2. Which pharmacy/location (including street and city if local pharmacy) is medication to be sent to? [facility] - [place], [place] - [address]    3. Do they need a 30 day or 90 day supply? 90

## 2024-02-29 DIAGNOSIS — N1831 Chronic kidney disease, stage 3a: Secondary | ICD-10-CM | POA: Diagnosis not present

## 2024-03-01 DIAGNOSIS — R519 Headache, unspecified: Secondary | ICD-10-CM | POA: Diagnosis not present

## 2024-03-05 DIAGNOSIS — L280 Lichen simplex chronicus: Secondary | ICD-10-CM | POA: Diagnosis not present

## 2024-03-06 ENCOUNTER — Encounter: Payer: Self-pay | Admitting: *Deleted

## 2024-03-06 DIAGNOSIS — I129 Hypertensive chronic kidney disease with stage 1 through stage 4 chronic kidney disease, or unspecified chronic kidney disease: Secondary | ICD-10-CM | POA: Diagnosis not present

## 2024-03-06 DIAGNOSIS — N1831 Chronic kidney disease, stage 3a: Secondary | ICD-10-CM | POA: Diagnosis not present

## 2024-03-06 DIAGNOSIS — E559 Vitamin D deficiency, unspecified: Secondary | ICD-10-CM | POA: Diagnosis not present

## 2024-03-06 DIAGNOSIS — E1122 Type 2 diabetes mellitus with diabetic chronic kidney disease: Secondary | ICD-10-CM | POA: Diagnosis not present

## 2024-03-07 ENCOUNTER — Encounter (HOSPITAL_BASED_OUTPATIENT_CLINIC_OR_DEPARTMENT_OTHER): Payer: Self-pay | Admitting: Cardiovascular Disease

## 2024-03-07 ENCOUNTER — Ambulatory Visit (INDEPENDENT_AMBULATORY_CARE_PROVIDER_SITE_OTHER): Admitting: Cardiovascular Disease

## 2024-03-07 VITALS — BP 142/84 | HR 64 | Ht 62.0 in | Wt 182.0 lb

## 2024-03-07 DIAGNOSIS — E669 Obesity, unspecified: Secondary | ICD-10-CM

## 2024-03-07 DIAGNOSIS — E785 Hyperlipidemia, unspecified: Secondary | ICD-10-CM

## 2024-03-07 DIAGNOSIS — I10 Essential (primary) hypertension: Secondary | ICD-10-CM

## 2024-03-07 NOTE — Patient Instructions (Signed)
 Medication Instructions:  Your physician recommends that you continue on your current medications as directed. Please refer to the Current Medication list given to you today.  *If you need a refill on your cardiac medications before your next appointment, please call your pharmacy*  Lab Work: FASTING LP/CMET/TSH SOON   If you have labs (blood work) drawn today and your tests are completely normal, you will receive your results only by: MyChart Message (if you have MyChart) OR A paper copy in the mail If you have any lab test that is abnormal or we need to change your treatment, we will call you to review the results.  Testing/Procedures: NONE  Follow-Up: At [facility], you and your health needs are our priority.  As part of our continuing mission to provide you with exceptional heart care, our providers are all part of one team.  This team includes your primary Cardiologist (physician) and Advanced Practice Providers or APPs (Physician Assistants and Nurse Practitioners) who all work together to provide you with the care you need, when you need it.  Your next appointment:   12 month(s)  Provider:   Annabella Scarce, MD, Rosaline Bane, NP, or Reche Finder, NP     We recommend signing up for the patient portal called MyChart.  Sign up information is provided on this After Visit Summary.  MyChart is used to connect with patients for Virtual Visits (Telemedicine).  Patients are able to view lab/test results, encounter notes, upcoming appointments, etc.  Non-urgent messages can be sent to your provider as well.   To learn more about what you can do with MyChart, go to [email or web address].

## 2024-03-07 NOTE — Progress Notes (Signed)
 Cardiology Office Note:  .   Date:  03/07/2024  ID:  Carolyn Fleming, DOB [birth year], MRN [id number] PCP: Pcp, No  [facility] Providers Cardiologist:  None    History of Present Illness: .    Carolyn Fleming is a 73 y.o. female with hypertension, hyperlipidemia, CAD (anterior STEMI status post CABG), CKD 3a, and OSA on CPAP who presents for follow-up.  She was previously a patient of Dr. Theressa.  She has a history of emergent CABG (LIMA-->LAD, SVG-->OM1) in 2013.  Last cath in 2016 revealed total occlusion of the distal left main and the mid LAD.  She had 90% proximal stenosis in the proximal LAD.  Her LIMA to the distal LAD and SVG to OM were patent.  Proximal to mid LAD filling was via the native circumflex.  There was concern that this region may be ischemic and causing symptoms.  RCA was widely patent.  Medical management was recommended.  If her symptoms became unacceptable there was consideration for recanalizing the distal left main to provide flow to the proximal and mid LAD.  She had an echo 06/2022 that revealed LVEF 60 to 65% with normal diastolic function.  She also had dyspnea 12/2021 and was doing well.  She has been intolerant of statins but has done well on Praluent .  At her visit 11/2022 she was doing well and exercising daily.  She had no exertional symptoms.   Discussed the use of AI scribe software for clinical note transcription with the patient, who gave verbal consent to proceed.  History of Present Illness Carolyn Fleming experiences chest tightening and arm pain, initially attributing it to stress. She sought evaluation and was referred to the ED for further assessment due to her history of coronary artery disease. She was advised to go to the emergency department for these symptoms, where she spent several hours and underwent testing.  High-sensitivity troponin was mildly elevated but flat.  Her symptoms were not thought to be consistent with acute obstructive coronary  disease.  She continues to have discomfort in her chest and shoulder.  It is worse at night and interferes with her ability to sleep.  She has a history of back pain and sciatica following an incident at [employer] where she was hit by a bakery rack. She underwent physical therapy and was informed by her orthopedic doctor that she will have ongoing sciatic nerve pain. She also reports a recent exacerbation of eczema on her back, which was intensely itchy and required dermatological intervention. She was prescribed a cream that she applies twice daily, which has provided some relief.  She reports a recent vitamin D deficiency, with a level of 15.9, identified by her kidney doctor. She has started vitamin D supplementation as of yesterday. She experiences fatigue and describes 'nodding out' while sitting, which she attributes to the low vitamin D levels.  She engages in regular physical activity, attending a senior center twice a week and using exercise equipment at home. She reports losing five pounds and feels good when exercising. She monitors her diet closely.  She has a history of prediabetes and previously tried Ozempic , which caused severe gastrointestinal distress, leading her to discontinue it. She is focused on weight loss to manage her blood sugar levels.  Her blood pressure at home is typically around 127/68 to 132/80. She is on amlodipine , losartan , hydrochlorothiazide, and takes Praluent  every two weeks for cholesterol management. She also takes a baby aspirin  daily.  ROS:  As  per HPI  Studies Reviewed: .       Echo 06/29/22: 1. Left ventricular ejection fraction, by estimation, is 60 to 65%. The  left ventricle has normal function. The left ventricle has no regional  wall motion abnormalities. Left ventricular diastolic parameters were  normal.   2. Right ventricular systolic function is normal. The right ventricular  size is normal. There is normal pulmonary artery systolic  pressure.   3. No evidence of mitral valve regurgitation.   4. The aortic valve is tricuspid. Aortic valve regurgitation is not  visualized.   5. The inferior vena cava is normal in size with greater than 50%  respiratory variability, suggesting right atrial pressure of 3 mmHg.   Risk Assessment/Calculations:   =  She comes   HYPERTENSION CONTROL Vitals:   03/07/24 0814 03/07/24 0846  BP: (!) 144/78 (!) 142/84    The patient's blood pressure is elevated above target today.  In order to address the patient's elevated BP: The blood pressure is usually elevated in clinic.  Blood pressures monitored at home have been optimal.          Physical Exam:   VS:  BP (!) 142/84   Pulse 64   Ht 5' 2 (1.575 m)   Wt 182 lb (82.6 kg)   SpO2 98%   BMI 33.29 kg/m  , BMI Body mass index is 33.29 kg/m. GENERAL:  Well appearing HEENT: Pupils equal round and reactive, fundi not visualized, oral mucosa unremarkable NECK:  No jugular venous distention, waveform within normal limits, carotid upstroke brisk and symmetric, no bruits, no thyromegaly LUNGS:  Clear to auscultation bilaterally HEART:  RRR.  PMI not displaced or sustained,S1 and S2 within normal limits, no S3, no S4, no clicks, no rubs, no murmurs ABD:  Flat, positive bowel sounds normal in frequency in pitch, no bruits, no rebound, no guarding, no midline pulsatile mass, no hepatomegaly, no splenomegaly EXT:  2 plus pulses throughout, no edema, no cyanosis no clubbing SKIN:  No rashes no nodules NEURO:  Cranial nerves II through XII grossly intact, motor grossly intact throughout PSYCH:  Cognitively intact, oriented to person place and time   ASSESSMENT AND PLAN: .    Assessment & Plan # Coronary artery disease # Hyperlipidemia: She has a history of STEMI and CABG.  She now reports intermittent chest and arm pain likely stress and sciatica-related, not cardiac. No exertional chest pain, indicating well-controlled disease. Stable  troponin levels ruled out acute coronary syndrome.  Given that she is exercising regularly and not having ischemic symptoms we will defer any further ischemic evaluation at this time. - Continue amlodipine , aspirin , and Praluent . - Monitor blood pressure at home. - Encourage regular exercise and weight management. - Educated on signs of cardiac chest pain and when to seek emergency care.  # Sleep apnea Well-managed with CPAP therapy, essential due to cardiac history. - Continue regular use of CPAP machine.  # Hypertension: Blood pressure elevated in the office but has been controlled at home.  Continue amlodipine , losartan /HCTZ.  Continue to monitor.  Blood pressure goal is less than 130/80.         Dispo: f/u 1 year  Signed, Annabella Scarce, MD

## 2024-03-08 ENCOUNTER — Other Ambulatory Visit (HOSPITAL_BASED_OUTPATIENT_CLINIC_OR_DEPARTMENT_OTHER): Payer: Self-pay | Admitting: *Deleted

## 2024-03-08 ENCOUNTER — Telehealth: Payer: Self-pay | Admitting: Cardiovascular Disease

## 2024-03-08 DIAGNOSIS — I10 Essential (primary) hypertension: Secondary | ICD-10-CM

## 2024-03-08 DIAGNOSIS — E785 Hyperlipidemia, unspecified: Secondary | ICD-10-CM

## 2024-03-08 DIAGNOSIS — E669 Obesity, unspecified: Secondary | ICD-10-CM

## 2024-03-08 DIAGNOSIS — I739 Peripheral vascular disease, unspecified: Secondary | ICD-10-CM

## 2024-03-08 NOTE — Telephone Encounter (Signed)
 Patient states that she needs a lab order to be able to give her dr office. Please advise

## 2024-03-15 DIAGNOSIS — E785 Hyperlipidemia, unspecified: Secondary | ICD-10-CM | POA: Diagnosis not present

## 2024-03-22 ENCOUNTER — Ambulatory Visit: Payer: Self-pay | Admitting: Cardiovascular Disease

## 2024-03-22 DIAGNOSIS — E785 Hyperlipidemia, unspecified: Secondary | ICD-10-CM

## 2024-03-22 DIAGNOSIS — I10 Essential (primary) hypertension: Secondary | ICD-10-CM

## 2024-03-23 DIAGNOSIS — N958 Other specified menopausal and perimenopausal disorders: Secondary | ICD-10-CM | POA: Diagnosis not present

## 2024-03-23 DIAGNOSIS — E559 Vitamin D deficiency, unspecified: Secondary | ICD-10-CM | POA: Diagnosis not present

## 2024-04-10 NOTE — Telephone Encounter (Signed)
 Pt is calling to check on referral to Pharm D. Please advise.

## 2024-04-12 DIAGNOSIS — H401122 Primary open-angle glaucoma, left eye, moderate stage: Secondary | ICD-10-CM | POA: Diagnosis not present

## 2024-04-12 DIAGNOSIS — H401113 Primary open-angle glaucoma, right eye, severe stage: Secondary | ICD-10-CM | POA: Diagnosis not present

## 2024-04-30 ENCOUNTER — Other Ambulatory Visit: Payer: Self-pay | Admitting: Cardiovascular Disease

## 2024-04-30 ENCOUNTER — Telehealth: Payer: Self-pay | Admitting: Cardiovascular Disease

## 2024-04-30 DIAGNOSIS — E785 Hyperlipidemia, unspecified: Secondary | ICD-10-CM

## 2024-04-30 DIAGNOSIS — I739 Peripheral vascular disease, unspecified: Secondary | ICD-10-CM

## 2024-04-30 DIAGNOSIS — I25810 Atherosclerosis of coronary artery bypass graft(s) without angina pectoris: Secondary | ICD-10-CM

## 2024-04-30 MED ORDER — PRALUENT 150 MG/ML ~~LOC~~ SOAJ: 150.0000 mg | SUBCUTANEOUS | 1 refills | Status: AC

## 2024-04-30 NOTE — Telephone Encounter (Signed)
 Spoke with patient regarding medication and referral  Patient would prefer seeing Dr Mona at [facility] office Referral placed and sent message to scheduling team

## 2024-04-30 NOTE — Telephone Encounter (Signed)
 Pt c/o medication issue:  1. Name of Medication: Alirocumab  (PRALUENT ) 150 MG/ML SOAJ  2. How are you currently taking this medication (dosage and times per day)?   Inject 1 mL (150 mg total) into the skin every 14 (fourteen) days.    3. Are you having a reaction (difficulty breathing--STAT)? No  4. What is your medication issue? Pt states that she received a call a few weeks ago stating that the doctor would like her to start taking Repatha, however there is no note of this in pt's chart. Pt tried to get above medication from pharmacy but there were no refills on medication. Pt would like confirmation on whether to stay on Praluent  or to start on Repatha. Please advise.

## 2024-05-07 DIAGNOSIS — E039 Hypothyroidism, unspecified: Secondary | ICD-10-CM | POA: Diagnosis not present

## 2024-05-07 DIAGNOSIS — Z1329 Encounter for screening for other suspected endocrine disorder: Secondary | ICD-10-CM | POA: Diagnosis not present

## 2024-05-07 DIAGNOSIS — Z79899 Other long term (current) drug therapy: Secondary | ICD-10-CM | POA: Diagnosis not present

## 2024-05-07 DIAGNOSIS — R7309 Other abnormal glucose: Secondary | ICD-10-CM | POA: Diagnosis not present

## 2024-05-07 DIAGNOSIS — E559 Vitamin D deficiency, unspecified: Secondary | ICD-10-CM | POA: Diagnosis not present

## 2024-05-14 ENCOUNTER — Encounter: Payer: Self-pay | Admitting: Internal Medicine

## 2024-05-14 ENCOUNTER — Ambulatory Visit: Admitting: Pharmacist

## 2024-05-14 ENCOUNTER — Ambulatory Visit: Attending: Internal Medicine | Admitting: Internal Medicine

## 2024-05-14 VITALS — BP 151/71 | HR 62 | Ht 62.0 in | Wt 184.0 lb

## 2024-05-14 DIAGNOSIS — T466X5A Adverse effect of antihyperlipidemic and antiarteriosclerotic drugs, initial encounter: Secondary | ICD-10-CM | POA: Insufficient documentation

## 2024-05-14 DIAGNOSIS — I25810 Atherosclerosis of coronary artery bypass graft(s) without angina pectoris: Secondary | ICD-10-CM | POA: Diagnosis not present

## 2024-05-14 DIAGNOSIS — E785 Hyperlipidemia, unspecified: Secondary | ICD-10-CM | POA: Diagnosis not present

## 2024-05-14 DIAGNOSIS — T466X5D Adverse effect of antihyperlipidemic and antiarteriosclerotic drugs, subsequent encounter: Secondary | ICD-10-CM | POA: Diagnosis not present

## 2024-05-14 DIAGNOSIS — I10 Essential (primary) hypertension: Secondary | ICD-10-CM | POA: Insufficient documentation

## 2024-05-14 DIAGNOSIS — M791 Myalgia, unspecified site: Secondary | ICD-10-CM | POA: Insufficient documentation

## 2024-05-14 MED ORDER — LOSARTAN POTASSIUM-HCTZ 50-12.5 MG PO TABS: 1.0000 | ORAL_TABLET | Freq: Every day | ORAL | 3 refills | Status: AC

## 2024-05-14 MED ORDER — AMLODIPINE BESYLATE 5 MG PO TABS: 5.0000 mg | ORAL_TABLET | Freq: Every day | ORAL | 3 refills | Status: AC

## 2024-05-14 NOTE — Progress Notes (Signed)
 LIPID CLINIC CONSULT NOTE  Chief Complaint:  Follow-up dyslipidemia  Primary Care Physician: Carolyn Elveria LABOR, MD  Primary Cardiologist:  Carolyn JAYSON Maxcy, MD  HPI:  Carolyn Fleming is a 73 y.o. female who is being seen today for the evaluation of dyslipidemia at the request of Carolyn Elveria LABOR, MD. This is a pleasant 73 year old female with history of coronary disease status post CABG x2 emergently with LIMA to LAD and SVG to OM1 in 2013.  She also has hypertension, dyslipidemia and obesity.  She previously had had breast cancer of the right breast and underwent chemotherapy when she was in [place] .  Her last cardiac catheterization was in September 2016, demonstrating total occlusion of the distal left main and patent SVG and LIMA grafts.  She has struggled with dyslipidemia and relative statin intolerance.  Although she has tried a number of statins before, she seems to be able to tolerate atorvastatin  in the past.  Currently she is taking 10 mg 3 times weekly and her most recent lipid profile in October showed total cholesterol 173, triglycerides 113, HDL 38 and LDL 114.  08/27/2019  [name] is seen today in follow-up.  Overall she has been doing very well on Praluent .  She is tolerated the injections and actually says she feels better on the medicine.  She has had marked improvement in her dyslipidemia.  Her total cholesterol now is 99, HDL 41, triglycerides 137 and LDL 34.  She remains on low-dose atorvastatin  10 mg 3 times weekly.  She still has some intermittent discomfort in her legs which may be myalgias related to the statin.  As her LDL is quite below eventually we may be able to discontinue the statin.  I typically do that when the LDLs below 25.  Additionally she might qualify for lower dose Praluent  75 mg every 2 weekly.  She also recently had regulation of her thyroid  which was hypothyroid.  This may also help her with weight loss and ultimately improve her lipid  profile.  04/03/20  [name] is seen today in follow-up.  Overall she now says she is feeling the best she has in years.  She was previously on thyroid  medicine and has discontinued it because she was having significant fatigue and weakness and the symptoms seem to have resolved.  In addition we had taken off of atorvastatin  a while ago as her response to Praluent  was excellent with LDL in the 30s.  Subsequently she had a repeat lipid profile as of August 12 which showed total cholesterol 134, triglycerides 122, HDL 40 and LDL 72.  This is very near goal LDL less than 70 indicates very good control of her lipids.  Based on her side effects I would remain off of statins completely at this point.  05/24/2022  [name] returns today for follow-up.  She continues to do well with Praluent .  She tells me unfortunately over the past several months she felt awful.  She called into the office wondering if it might be a side effect of the medicine.  She had visited her PCP and had a thorough work-up.  No clear cause was found but then she saw her GYN who checked her thyroid  and noted it was severely hypothyroid.  Apparently she was taken off of her thyroid  medicine by her PCP but clearly she needed it.  She is now back on levothyroxine and she is feeling much better.  Current lipid profile showed total cholesterol 134, triglycerides 138,  HDL 42 and LDL 68.  05/14/2024  [name] is seen today in follow-up.  She has had further improvement in her lipids with total cholesterol now 161, triglycerides 149, HDL 48 and LDL 87.  This remains above her target LDL less than 70.  Overall she is tolerating Praluent  well.  She is also had a number of recent metabolic abnormalities including hypothyroidism and low vitamin D.  This all required replacement and may impact her cholesterol.  Blood pressure was somewhat elevated today although better control at home.  She is asking for refills of her medications of the amlodipine   and Hyzaar specifically through [facility].  Going forward she wishes for me to follow her as her primary cardiologist.  PMHx:  Past Medical History:  Diagnosis Date   Arthritis    Asthma    Breast cancer (HCC)    Right breast    GERD (gastroesophageal reflux disease)    Glaucoma    Heart disease    Hyperlipidemia    Hypertension 01/25/2012   Obesity (BMI 35.0-39.9 without comorbidity) 01/25/2012   S/P CABG x 2 01/24/2012   Emergency LIMA to LAD, SVG to OM1, EVH via right thigh   Sleep apnea    uses cpap   Thyroid  disease     Past Surgical History:  Procedure Laterality Date   ABDOMINAL HYSTERECTOMY     BREAST LUMPECTOMY  1997   breast cancer   CARDIAC CATHETERIZATION N/A 05/07/2015   Procedure: Left Heart Cath and Cors/Grafts Angiography;  Surgeon: Victory LELON Claudene, MD;  Location: [facility] INVASIVE CV LAB;  Service: Cardiovascular;  Laterality: N/A;   CHOLECYSTECTOMY     CORONARY ARTERY BYPASS GRAFT  01/24/2012   Procedure: CORONARY ARTERY BYPASS GRAFTING (CABG);  Surgeon: Sudie VEAR Laine, MD;  Location: [facility] OR;  Service: Open Heart Surgery;  Laterality: N/A;  Coronary Artery bypass Graft times two utilizing the left internal mammary artery and the right greater saphenous vein harvested endoscopically,  Transesophageal Echocardiogram   LEFT HEART CATHETERIZATION WITH CORONARY ANGIOGRAM N/A 01/24/2012   Procedure: LEFT HEART CATHETERIZATION WITH CORONARY ANGIOGRAM;  Surgeon: Victory LELON Claudene DOUGLAS, MD;  Location: [facility] CATH LAB;  Service: Cardiovascular;  Laterality: N/A;   TONSILLECTOMY     TUBAL LIGATION      FAMHx:  Family History  Problem Relation Age of Onset   Cancer Mother        Colon   Diabetes Mother    Hypertension Mother    Diabetes Sister    Cancer Brother        Lung   Diabetes Brother    Hypertension Brother    Breast cancer Niece        30s    SOCHx:   reports that she has never smoked. She has never used smokeless tobacco. She reports that she does not drink alcohol  and  does not use drugs.  ALLERGIES:  Allergies  Allergen Reactions   Amoxicillin-Pot Clavulanate Nausea Only and Shortness Of Breath    Heart racing  amoxicillin / clavulanate   Pilocarpine     Other Reaction(s): Other (See Comments)  Reaction: Chills (intolerance)   Acetazolamide Other (See Comments)   Allegra  [Fexofenadine ] Other (See Comments)    hallucinations   Amoxicillin Er Other (See Comments)   Codeine Other (See Comments)    Lethargy   Fluticasone  Other (See Comments)   Iodine Other (See Comments)    Per allergy test   Latex     REACTION: hives  Levofloxacin Other (See Comments)   Lisinopril  Other (See Comments)   Meloxicam Other (See Comments)   Montelukast  Other (See Comments)    hallucinations   Octreotide Acetate Dermatitis   Propofol  Other (See Comments)   Statins Other (See Comments)    Atorvastatin , rosuvastatin   Sulfonamide Derivatives     Other Reaction(s): GI Intolerance  REACTION: gi upset   Zyrtec [Cetirizine] Other (See Comments)    Night terrors    ROS: Pertinent items noted in HPI and remainder of comprehensive ROS otherwise negative.  HOME MEDS: Current Outpatient Medications on File Prior to Visit  Medication Sig Dispense Refill   Alirocumab  (PRALUENT ) 150 MG/ML SOAJ Inject 1 mL (150 mg total) into the skin every 14 (fourteen) days. 6 mL 1   aspirin  81 MG chewable tablet Chew 81 mg by mouth daily.     Azelastine  HCl 0.15 % SOLN azelastine  0.15 % (205.5 mcg) nasal spray  USE 1 SPRAY INTO EACH NOSTRIL TWICE A DAY 30 mL 5   diazepam  (VALIUM ) 5 MG tablet Take 1 tablet (5 mg total) by mouth every 8 (eight) hours as needed (dizziness). 30 tablet 0   Dorzolamide  HCl-Timolol  Mal PF 2-0.5 % SOLN Place 2 % into the left eye in the morning and at bedtime.      hydrocortisone cream 1 % Apply 1 application topically as needed for itching.     ketoconazole (NIZORAL) 2 % cream Apply 1 application topically as needed for irritation.     levothyroxine  (SYNTHROID) 50 MCG tablet Take 50 mcg by mouth daily.     meclizine  (ANTIVERT ) 12.5 MG tablet Take 1 tablet (12.5 mg total) by mouth 3 (three) times daily as needed for dizziness. 90 tablet 0   methylPREDNISolone  (MEDROL  DOSEPAK) 4 MG TBPK tablet Take as directed per package instructions 21 tablet 0   nitroGLYCERIN  (NITROSTAT ) 0.4 MG SL tablet Place 1 tablet (0.4 mg total) under the tongue every 5 (five) minutes as needed for chest pain. 25 tablet 0   ondansetron  (ZOFRAN ) 8 MG tablet TAKE 1 TABLET BY MOUTH TWICE A DAY AS NEEDED FOR NAUSEA  1   promethazine (PHENERGAN) 25 MG tablet Take 25 mg by mouth every 4 (four) hours as needed.     Travoprost, BAK Free, (TRAVATAN) 0.004 % SOLN ophthalmic solution Place 1 drop into the left eye.      Vitamin D, Ergocalciferol, (DRISDOL) 1.25 MG (50000 UNIT) CAPS capsule Take 50,000 Units by mouth once a week.     No current facility-administered medications on file prior to visit.    LABS/IMAGING: No results found for this or any previous visit (from the past 48 hours). No results found.  LIPID PANEL:    Component Value Date/Time   CHOL 149 11/17/2022 1556   TRIG 164 (H) 11/17/2022 1556   HDL 47 11/17/2022 1556   CHOLHDL 3.2 11/17/2022 1556   CHOLHDL 4.4 12/12/2015 0828   VLDL 29 12/12/2015 0828   LDLCALC 74 11/17/2022 1556   LDLDIRECT 149.1 05/28/2013 0946    WEIGHTS: Wt Readings from Last 3 Encounters:  05/14/24 184 lb (83.5 kg)  03/07/24 182 lb (82.6 kg)  12/22/23 186 lb (84.4 kg)    VITALS: BP (!) 151/71 (BP Location: Left Arm, Patient Position: Sitting, Cuff Size: Normal)   Pulse 62   Ht 5' 2 (1.575 m)   Wt 184 lb (83.5 kg)   SpO2 99%   BMI 33.65 kg/m   EXAM: General appearance: alert and no distress  Lungs: clear to auscultation bilaterally Heart: regular rate and rhythm, S1, S2 normal, no murmur, click, rub or gallop Extremities: extremities normal, atraumatic, no cyanosis or edema Neurologic: Grossly  normal  EKG: Deferred  ASSESSMENT: Mixed dyslipidemia, goal LDL<70 Statin intolerance Coronary artery disease status post two-vessel CABG -LIMA to LAD, SVG to OM (2013) PAD Hypertension Hypothyroidism Low vitamin D level  PLAN: 1.  Ms. Palinkas had recent lipids that are somewhat above target however she has had hypothyroidism and low vitamin D both of which can impact her cholesterol.  I would not make any changes to her meds today and continue on the Praluent  with plan to repeat lipids in 6 months.  Will refill her blood pressure medicines today as well.   Follow-up in 6 months or sooner as necessary with me.  Carolyn KYM Maxcy, MD, [facility], FNLA, FACP  [facility]  Medical Director of the Advanced Lipid Disorders &  Cardiovascular Risk Reduction Clinic Diplomate of the American Board of Clinical Lipidology Attending Cardiologist  Direct Dial: [phone]  Fax: [phone]  Website:  [email or web address] Carolyn BROCKS Antwone Capozzoli 05/14/2024, 6:07 PM

## 2024-05-14 NOTE — Progress Notes (Deleted)
 Patient ID: Carolyn Fleming                 DOB: 21-Jul-1951                    MRN: [id number]      HPI: Carolyn Fleming is a 73 y.o. female patient referred to lipid clinic by Dr. Raford. PMH is significant for hypertension, hyperlipidemia, CAD (anterior STEMI status post CABG), CKD 3a, and OSA on CPAP.  She has a history of emergent CABG (LIMA-->LAD, SVG-->OM1) in 2013. Last cath in 2016 revealed total occlusion of the distal left main and the mid LAD. She had 90% proximal stenosis in the proximal LAD. Her LIMA to the distal LAD and SVG to OM were patent.   Patient has been on Praluent  since 2023. LDL-C creping up. Last reading 87 on 03/15/24.    Reviewed options for lowering LDL cholesterol, including ezetimibe, PCSK-9 inhibitors, bempedoic acid and inclisiran.  Discussed mechanisms of action, dosing, side effects and potential decreases in LDL cholesterol.  Also reviewed cost information and potential options for patient assistance.   Current Medications: Praluent  150mg  q 14 days Intolerances: atorvastatin  10,20mg  Risk Factors:  LDL-C goal:  ApoB goal:   Diet:   Exercise:   Family History:   Social History:   Labs: Lipid Panel  03/15/24 LDL-C 87, TG 149    Component Value Date/Time   CHOL 149 11/17/2022 1556   TRIG 164 (H) 11/17/2022 1556   HDL 47 11/17/2022 1556   CHOLHDL 3.2 11/17/2022 1556   CHOLHDL 4.4 12/12/2015 0828   VLDL 29 12/12/2015 0828   LDLCALC 74 11/17/2022 1556   LDLDIRECT 149.1 05/28/2013 0946   LABVLDL 28 11/17/2022 1556    Past Medical History:  Diagnosis Date   Arthritis    Asthma    Breast cancer (HCC)    Right breast    GERD (gastroesophageal reflux disease)    Glaucoma    Heart disease    Hyperlipidemia    Hypertension 01/25/2012   Obesity (BMI 35.0-39.9 without comorbidity) 01/25/2012   S/P CABG x 2 01/24/2012   Emergency LIMA to LAD, SVG to OM1, EVH via right thigh   Sleep apnea    uses cpap   Thyroid  disease     Current  Outpatient Medications on File Prior to Visit  Medication Sig Dispense Refill   Alirocumab  (PRALUENT ) 150 MG/ML SOAJ Inject 1 mL (150 mg total) into the skin every 14 (fourteen) days. 6 mL 1   amLODipine  (NORVASC ) 5 MG tablet TAKE 1 TABLET (5 MG TOTAL) BY MOUTH DAILY. 90 tablet 3   aspirin  81 MG chewable tablet Chew 81 mg by mouth daily.     Azelastine  HCl 0.15 % SOLN azelastine  0.15 % (205.5 mcg) nasal spray  USE 1 SPRAY INTO EACH NOSTRIL TWICE A DAY 30 mL 5   diazepam  (VALIUM ) 5 MG tablet Take 1 tablet (5 mg total) by mouth every 8 (eight) hours as needed (dizziness). 30 tablet 0   Dorzolamide  HCl-Timolol  Mal PF 2-0.5 % SOLN Place 2 % into the left eye in the morning and at bedtime.      hydrocortisone cream 1 % Apply 1 application topically as needed for itching.     ketoconazole (NIZORAL) 2 % cream Apply 1 application topically as needed for irritation.     levothyroxine (SYNTHROID) 50 MCG tablet Take 50 mcg by mouth daily.     losartan -hydrochlorothiazide (HYZAAR) 50-12.5 MG tablet TAKE 1  TABLET BY MOUTH EVERY DAY 90 tablet 3   meclizine  (ANTIVERT ) 12.5 MG tablet Take 1 tablet (12.5 mg total) by mouth 3 (three) times daily as needed for dizziness. 90 tablet 0   methylPREDNISolone  (MEDROL  DOSEPAK) 4 MG TBPK tablet Take as directed per package instructions 21 tablet 0   nitroGLYCERIN  (NITROSTAT ) 0.4 MG SL tablet Place 1 tablet (0.4 mg total) under the tongue every 5 (five) minutes as needed for chest pain. 25 tablet 0   ondansetron  (ZOFRAN ) 8 MG tablet TAKE 1 TABLET BY MOUTH TWICE A DAY AS NEEDED FOR NAUSEA  1   promethazine (PHENERGAN) 25 MG tablet Take 25 mg by mouth every 4 (four) hours as needed.     Travoprost, BAK Free, (TRAVATAN) 0.004 % SOLN ophthalmic solution Place 1 drop into the left eye.      Vitamin D, Ergocalciferol, (DRISDOL) 1.25 MG (50000 UNIT) CAPS capsule Take 50,000 Units by mouth once a week.     No current facility-administered medications on file prior to visit.     Allergies  Allergen Reactions   Amoxicillin-Pot Clavulanate Nausea Only and Shortness Of Breath    Heart racing  amoxicillin / clavulanate   Pilocarpine     Other Reaction(s): Other (See Comments)  Reaction: Chills (intolerance)   Acetazolamide Other (See Comments)   Allegra  [Fexofenadine ] Other (See Comments)    hallucinations   Amoxicillin Er Other (See Comments)   Codeine Other (See Comments)    Lethargy   Fluticasone  Other (See Comments)   Iodine Other (See Comments)    Per allergy test   Latex     REACTION: hives   Levofloxacin Other (See Comments)   Lisinopril  Other (See Comments)   Meloxicam Other (See Comments)   Montelukast  Other (See Comments)    hallucinations   Octreotide Acetate Dermatitis   Propofol  Other (See Comments)   Statins Other (See Comments)    Atorvastatin , rosuvastatin   Sulfonamide Derivatives     Other Reaction(s): GI Intolerance  REACTION: gi upset   Zyrtec [Cetirizine] Other (See Comments)    Night terrors    Assessment/Plan:  1. Hyperlipidemia -  No problem-specific Assessment & Plan notes found for this encounter.    Thank you,  Anderia Lorenzo D Maan Zarcone, Pharm.JONETTA SARAN, CPP [facility] A Division of [facility] [address], [place], [place] [address]  Phone: [phone]; Fax: [phone]

## 2024-05-14 NOTE — Patient Instructions (Signed)
 Medication Instructions:  NO CHANGES  *If you need a refill on your cardiac medications before your next appointment, please call your pharmacy*  Lab Work: FASTING Lipid Panel in 6 months   If you have labs (blood work) drawn today and your tests are completely normal, you will receive your results only by: MyChart Message (if you have MyChart) OR A paper copy in the mail If you have any lab test that is abnormal or we need to change your treatment, we will call you to review the results.   Follow-Up: At [facility], you and your health needs are our priority.  As part of our continuing mission to provide you with exceptional heart care, our providers are all part of one team.  This team includes your primary Cardiologist (physician) and Advanced Practice Providers or APPs (Physician Assistants and Nurse Practitioners) who all work together to provide you with the care you need, when you need it.  Your next appointment:    6 months with Dr. Mona  We recommend signing up for the patient portal called MyChart.  Sign up information is provided on this After Visit Summary.  MyChart is used to connect with patients for Virtual Visits (Telemedicine).  Patients are able to view lab/test results, encounter notes, upcoming appointments, etc.  Non-urgent messages can be sent to your provider as well.   To learn more about what you can do with MyChart, go to [email or web address].

## 2024-05-28 DIAGNOSIS — Z1211 Encounter for screening for malignant neoplasm of colon: Secondary | ICD-10-CM | POA: Diagnosis not present

## 2024-05-28 DIAGNOSIS — Z800 Family history of malignant neoplasm of digestive organs: Secondary | ICD-10-CM | POA: Diagnosis not present

## 2024-06-29 ENCOUNTER — Telehealth: Payer: Self-pay | Admitting: Pharmacy Technician

## 2024-06-29 NOTE — Telephone Encounter (Signed)
 Patient Advocate Encounter   The patient was approved for a [employer] grant  Total amount awarded, 2500.00.  Effective: 07/23/24 - 07/22/25   APW:[id number] ERW:[id number] Group:[id number] PI:[id number]    Received through fax-

## 2024-06-30 DIAGNOSIS — J019 Acute sinusitis, unspecified: Secondary | ICD-10-CM | POA: Diagnosis not present

## 2024-06-30 DIAGNOSIS — R051 Acute cough: Secondary | ICD-10-CM | POA: Diagnosis not present

## 2024-07-13 ENCOUNTER — Telehealth: Payer: Self-pay | Admitting: Student

## 2024-07-13 NOTE — Telephone Encounter (Addendum)
 Patient called the after-hours answering service reporting that since she got her colonoscopy on 05/28/2024 her stomach has hurt, her back has hurt, she has generalized fatigue, and she has a foggy mind.  Denies having any shortness of breath.  Reported that she had a few brief episodes of chest pain but the other generalized symptoms were her main concern.  Patient reported that she called the doctor that did the colonoscopy and was told to call her PCP.  Her PCP told her that she needs to follow-up with the doctor that did the colonoscopy.  Patient stated that she thought it was the sodium that they gave her that is causing her symptoms.  Encouraged the patient to go to an urgent care or urgency department if she was concerned about her symptoms. The patient stated that she does not want to go she has had poor experiences in the past.  Patient reported that she will call the office on Monday to try get a follow-up appointment.  Informed the patient that Dr. Mona does not have any follow-up appointments for the next 3 months.  The patient may need to get a TSH, CBC, and BMP checked. It would be best for her to do this with her PCP.  SignedMorse Clause, PA-C 07/13/2024, 5:44 PM

## 2024-07-16 DIAGNOSIS — H401113 Primary open-angle glaucoma, right eye, severe stage: Secondary | ICD-10-CM | POA: Diagnosis not present

## 2024-07-16 DIAGNOSIS — N390 Urinary tract infection, site not specified: Secondary | ICD-10-CM | POA: Diagnosis not present

## 2024-07-16 DIAGNOSIS — R82998 Other abnormal findings in urine: Secondary | ICD-10-CM | POA: Diagnosis not present

## 2024-07-16 DIAGNOSIS — R102 Pelvic and perineal pain unspecified side: Secondary | ICD-10-CM | POA: Diagnosis not present

## 2024-07-16 DIAGNOSIS — H401122 Primary open-angle glaucoma, left eye, moderate stage: Secondary | ICD-10-CM | POA: Diagnosis not present

## 2024-07-16 NOTE — Telephone Encounter (Signed)
 Pt called in about this note. She denies any SOB, CP, or any other cardiac symptom. She states she still feels foggy and fatigue. She states she called her PCP and they told her what are they supposed to do. Please advise.

## 2024-07-16 NOTE — Telephone Encounter (Signed)
 Spoke with pt who insists her brain remains asleep from most recent colonoscopy.  Pt states she has contacted her GI provider as well as her PCP to advise of her concerns.  Pt states PCP canceled her appointment for follow up.  She denies current CP, SOB or dizziness.  Encouraged pt to contact her PCP again to request an appointment for labs as suggested by cardiology PA-C.  Pt verbalizes understanding and agrees with current plan.

## 2024-07-19 NOTE — Telephone Encounter (Signed)
 PT called to inform us  that she got diagnosed with a UTI and she just wanted us  to know that what was wrong with her.

## 2024-07-20 DIAGNOSIS — N1831 Chronic kidney disease, stage 3a: Secondary | ICD-10-CM | POA: Diagnosis not present

## 2024-07-24 DIAGNOSIS — N1831 Chronic kidney disease, stage 3a: Secondary | ICD-10-CM | POA: Diagnosis not present

## 2024-07-24 DIAGNOSIS — I129 Hypertensive chronic kidney disease with stage 1 through stage 4 chronic kidney disease, or unspecified chronic kidney disease: Secondary | ICD-10-CM | POA: Diagnosis not present

## 2024-07-26 DIAGNOSIS — J329 Chronic sinusitis, unspecified: Secondary | ICD-10-CM | POA: Diagnosis not present

## 2024-08-24 ENCOUNTER — Telehealth: Payer: Self-pay | Admitting: Internal Medicine

## 2024-08-24 NOTE — Telephone Encounter (Signed)
 Yes, this is fine

## 2024-08-24 NOTE — Telephone Encounter (Signed)
 Pt states she received new medication Cefuroxime  (axetil) 500 mg take by mouth twice a day for 10 days and wants to know if its ok to take. Pt would like a c/b regarding this matter, please advise.

## 2024-08-24 NOTE — Telephone Encounter (Signed)
 Spoke with patient on the phone and advised her that per [facility] it was ok to take the ABT. Pt also inquired about a humidifier and if it would do anything to her heart. Advised her there were no contraindications.

## 2024-12-27 ENCOUNTER — Ambulatory Visit: Admitting: Neurology
